# Patient Record
Sex: Female | Born: 1940 | Race: White | Hispanic: No | Marital: Single | Smoking: Never smoker
Health system: Southern US, Community
[De-identification: ages and names within clinical notes are randomized; demographics above are authoritative.]

## PROBLEM LIST (undated history)

## (undated) DIAGNOSIS — F0390 Unspecified dementia without behavioral disturbance: Secondary | ICD-10-CM

## (undated) DIAGNOSIS — F419 Anxiety disorder, unspecified: Secondary | ICD-10-CM

## (undated) DIAGNOSIS — I2510 Atherosclerotic heart disease of native coronary artery without angina pectoris: Secondary | ICD-10-CM

---

## 1999-03-12 ENCOUNTER — Encounter: Payer: Self-pay | Admitting: Ophthalmology

## 1999-03-12 ENCOUNTER — Ambulatory Visit (HOSPITAL_COMMUNITY): Admission: RE | Admit: 1999-03-12 | Discharge: 1999-03-13 | Payer: Self-pay | Admitting: Ophthalmology

## 2003-04-12 ENCOUNTER — Other Ambulatory Visit: Admission: RE | Admit: 2003-04-12 | Discharge: 2003-04-12 | Payer: Self-pay | Admitting: Family Medicine

## 2007-07-01 ENCOUNTER — Encounter: Admission: RE | Admit: 2007-07-01 | Discharge: 2007-07-01 | Payer: Self-pay | Admitting: Family Medicine

## 2010-12-25 ENCOUNTER — Observation Stay (HOSPITAL_COMMUNITY)
Admission: RE | Admit: 2010-12-25 | Discharge: 2010-12-26 | Disposition: A | Discharge status: Home / Self Care | Payer: Medicare Other | Source: Ambulatory Visit | Attending: Ophthalmology | Admitting: Ophthalmology

## 2010-12-25 DIAGNOSIS — F3289 Other specified depressive episodes: Secondary | ICD-10-CM | POA: Insufficient documentation

## 2010-12-25 DIAGNOSIS — G4733 Obstructive sleep apnea (adult) (pediatric): Secondary | ICD-10-CM | POA: Insufficient documentation

## 2010-12-25 DIAGNOSIS — M129 Arthropathy, unspecified: Secondary | ICD-10-CM | POA: Insufficient documentation

## 2010-12-25 DIAGNOSIS — H33339 Multiple defects of retina without detachment, unspecified eye: Secondary | ICD-10-CM | POA: Insufficient documentation

## 2010-12-25 DIAGNOSIS — H35349 Macular cyst, hole, or pseudohole, unspecified eye: Principal | ICD-10-CM | POA: Insufficient documentation

## 2010-12-25 DIAGNOSIS — F329 Major depressive disorder, single episode, unspecified: Secondary | ICD-10-CM | POA: Insufficient documentation

## 2010-12-25 LAB — SURGICAL PCR SCREEN
MRSA, PCR: NEGATIVE
Staphylococcus aureus: NEGATIVE

## 2010-12-25 LAB — AUTOLOGOUS SERUM PATCH PREP

## 2010-12-27 NOTE — Op Note (Signed)
  NAME:  Tiffany Mack, Tiffany Mack NO.:  [id number]  MEDICAL RECORD NO.:  [id number]           PATIENT TYPE:  O  LOCATION:  5149                         FACILITY:  [facility]  PHYSICIAN:  Beulah Gandy. Ashley Royalty, M.D. DATE OF BIRTH:  [birth year]  DATE OF PROCEDURE:  12/25/2010 DATE OF DISCHARGE:                              OPERATIVE REPORT   ADMISSION DIAGNOSES: 1. Macular hole, right eye. 2. Retinal breaks, right eye.  PROCEDURES:  Retinal photocoagulation and pars plana vitrectomy, gas- fluid exchange, membrane peel, serum patch all in the right eye.  SURGEON:  Beulah Gandy. Ashley Royalty, MD  ASSISTANT:  Rosalie Doctor, MA  ANESTHESIA:  General.  DETAILS:  Usual prep and drape, indirect ophthalmoscope laser moved into place, 725 burns placed around suspicious areas for break in the retinal periphery.  The power was 400 mW, 1000 microns each, and 0.1 seconds each.  A 25-gauge trocar was placed at 8, 10, and 2 o'clock, infusion at 8 o'clock.  Contact lens ring anchored into place at 6 and 12 o'clock and a 20-gauge MVR opening was made at 2 o'clock.  Pars plana vitrectomy was begun just behind the pseudophakos.  Dense white membranes were encountered and carefully removed down to the macular surface and out into the mid periphery.  The silicone tip suction line was brought down to the macular surface and the fish strike sign occurred.  The macular hole jumped.  The cutter was repositioned and additional posterior hyaloid was removed.  The 20-gauge diamond dusted membrane scraper was used to engage the internal limiting membrane and remove it from its attachment to this retinal surface for 360 degrees around the hole.  The membrane was removed with the vitreous cutter.  The vitrectomy was carried into the mid periphery into the far periphery.  The vitreous base was trimmed.  A total gas-fluid exchange was performed.  Sufficient time was allowed for additional fluid to track down the walls of  the eye and collected in the posterior segment.  Additional fluid was removed with the [place] ophthalmics brush.  Serum patch was delivered.  Gas-fluid exchange was performed with 16% C3F8 exchanging for intravitreal gas. The instruments were removed from the eye and 9-0 nylon was used to close the sclerotomy site.  The wounds were tested and found to be tight.  The closing tension was 10 with a Risk manager.  Wet-field cautery was used to close the conjunctiva at 2 o'clock.  Polymyxin and gentamicin were irrigated into [place] space.  Atropine solution was applied.  Decadron 10 mg was injected to the lower subconjunctival space.  Polysporin ophthalmic ointment and a patch and shield were placed.  Closing pressure was 10 with a Risk manager. Complications none.  Duration 1 hour and 15 minutes.  The patient was awakened, taken to recovery in satisfactory condition.     Beulah Gandy. Ashley Royalty, M.D.     JDM/MEDQ  D:  12/25/2010  T:  12/26/2010  Job:  [id number]  Electronically Signed by Alan Mulder M.D. on 12/27/2010 07:23:57 AM

## 2011-04-02 ENCOUNTER — Encounter (INDEPENDENT_AMBULATORY_CARE_PROVIDER_SITE_OTHER): Payer: Medicare Other | Admitting: Ophthalmology

## 2011-04-02 DIAGNOSIS — Q143 Congenital malformation of choroid: Secondary | ICD-10-CM

## 2011-04-02 DIAGNOSIS — H35349 Macular cyst, hole, or pseudohole, unspecified eye: Secondary | ICD-10-CM

## 2011-09-30 ENCOUNTER — Encounter (INDEPENDENT_AMBULATORY_CARE_PROVIDER_SITE_OTHER): Payer: Medicare Other | Admitting: Ophthalmology

## 2011-09-30 DIAGNOSIS — H35349 Macular cyst, hole, or pseudohole, unspecified eye: Secondary | ICD-10-CM

## 2011-10-20 ENCOUNTER — Encounter (HOSPITAL_COMMUNITY): Payer: Self-pay

## 2011-10-20 ENCOUNTER — Emergency Department (HOSPITAL_COMMUNITY)
Admission: EM | Admit: 2011-10-20 | Discharge: 2011-10-20 | Disposition: A | Discharge status: Home / Self Care | Payer: No Typology Code available for payment source | Attending: Emergency Medicine | Admitting: Emergency Medicine

## 2011-10-20 ENCOUNTER — Emergency Department (HOSPITAL_COMMUNITY): Payer: No Typology Code available for payment source

## 2011-10-20 DIAGNOSIS — Z7982 Long term (current) use of aspirin: Secondary | ICD-10-CM | POA: Insufficient documentation

## 2011-10-20 DIAGNOSIS — Z79899 Other long term (current) drug therapy: Secondary | ICD-10-CM | POA: Insufficient documentation

## 2011-10-20 DIAGNOSIS — M25569 Pain in unspecified knee: Secondary | ICD-10-CM | POA: Insufficient documentation

## 2011-10-20 DIAGNOSIS — M25519 Pain in unspecified shoulder: Secondary | ICD-10-CM | POA: Insufficient documentation

## 2011-10-20 HISTORY — DX: Anxiety disorder, unspecified: F41.9
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
## 2011-10-20 NOTE — ED Notes (Signed)
MD at bedside. 

## 2011-10-20 NOTE — ED Notes (Signed)
Vital signs stable. 

## 2011-10-20 NOTE — ED Notes (Signed)
Patient transported to X-ray 
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
## 2011-11-12 ENCOUNTER — Other Ambulatory Visit (HOSPITAL_COMMUNITY): Payer: Self-pay | Admitting: Family Medicine

## 2011-11-12 DIAGNOSIS — Z1231 Encounter for screening mammogram for malignant neoplasm of breast: Secondary | ICD-10-CM

## 2011-12-09 ENCOUNTER — Ambulatory Visit (HOSPITAL_COMMUNITY): Payer: Medicare Other

## 2011-12-12 ENCOUNTER — Ambulatory Visit (HOSPITAL_COMMUNITY)
Admission: RE | Admit: 2011-12-12 | Discharge: 2011-12-12 | Disposition: A | Discharge status: Home / Self Care | Payer: Medicare Other | Source: Ambulatory Visit | Attending: Family Medicine | Admitting: Family Medicine

## 2011-12-12 DIAGNOSIS — Z1231 Encounter for screening mammogram for malignant neoplasm of breast: Secondary | ICD-10-CM | POA: Insufficient documentation

## 2012-09-29 ENCOUNTER — Ambulatory Visit (INDEPENDENT_AMBULATORY_CARE_PROVIDER_SITE_OTHER): Payer: Medicare Other | Admitting: Ophthalmology

## 2012-10-12 ENCOUNTER — Ambulatory Visit (INDEPENDENT_AMBULATORY_CARE_PROVIDER_SITE_OTHER): Payer: Self-pay | Admitting: Ophthalmology

## 2012-10-12 DIAGNOSIS — H43819 Vitreous degeneration, unspecified eye: Secondary | ICD-10-CM

## 2012-10-12 DIAGNOSIS — H2700 Aphakia, unspecified eye: Secondary | ICD-10-CM

## 2012-10-12 DIAGNOSIS — H35349 Macular cyst, hole, or pseudohole, unspecified eye: Secondary | ICD-10-CM

## 2013-10-13 ENCOUNTER — Ambulatory Visit (INDEPENDENT_AMBULATORY_CARE_PROVIDER_SITE_OTHER): Payer: Commercial Managed Care - HMO | Admitting: Ophthalmology

## 2013-10-13 DIAGNOSIS — H33309 Unspecified retinal break, unspecified eye: Secondary | ICD-10-CM

## 2013-10-13 DIAGNOSIS — H35349 Macular cyst, hole, or pseudohole, unspecified eye: Secondary | ICD-10-CM

## 2013-11-25 ENCOUNTER — Other Ambulatory Visit: Payer: Self-pay

## 2013-11-25 DIAGNOSIS — Z1231 Encounter for screening mammogram for malignant neoplasm of breast: Secondary | ICD-10-CM

## 2013-12-06 ENCOUNTER — Ambulatory Visit
Admission: RE | Admit: 2013-12-06 | Discharge: 2013-12-06 | Disposition: A | Discharge status: Home / Self Care | Payer: Commercial Managed Care - HMO | Source: Ambulatory Visit

## 2013-12-06 ENCOUNTER — Encounter (INDEPENDENT_AMBULATORY_CARE_PROVIDER_SITE_OTHER): Payer: Self-pay

## 2013-12-06 DIAGNOSIS — Z1231 Encounter for screening mammogram for malignant neoplasm of breast: Secondary | ICD-10-CM

## 2014-04-26 ENCOUNTER — Other Ambulatory Visit: Payer: Self-pay | Admitting: Family Medicine

## 2014-04-26 ENCOUNTER — Ambulatory Visit
Admission: RE | Admit: 2014-04-26 | Discharge: 2014-04-26 | Disposition: A | Discharge status: Home / Self Care | Payer: Commercial Managed Care - HMO | Source: Ambulatory Visit | Attending: Family Medicine | Admitting: Family Medicine

## 2014-04-26 DIAGNOSIS — R198 Other specified symptoms and signs involving the digestive system and abdomen: Secondary | ICD-10-CM

## 2014-10-26 ENCOUNTER — Ambulatory Visit (INDEPENDENT_AMBULATORY_CARE_PROVIDER_SITE_OTHER): Payer: Commercial Managed Care - HMO | Admitting: Ophthalmology

## 2014-11-16 ENCOUNTER — Ambulatory Visit (INDEPENDENT_AMBULATORY_CARE_PROVIDER_SITE_OTHER): Payer: Commercial Managed Care - HMO | Admitting: Ophthalmology

## 2014-11-16 DIAGNOSIS — H33302 Unspecified retinal break, left eye: Secondary | ICD-10-CM | POA: Diagnosis not present

## 2014-11-16 DIAGNOSIS — H35343 Macular cyst, hole, or pseudohole, bilateral: Secondary | ICD-10-CM

## 2014-12-02 ENCOUNTER — Other Ambulatory Visit: Payer: Self-pay

## 2014-12-02 DIAGNOSIS — Z1231 Encounter for screening mammogram for malignant neoplasm of breast: Secondary | ICD-10-CM

## 2014-12-28 ENCOUNTER — Ambulatory Visit
Admission: RE | Admit: 2014-12-28 | Discharge: 2014-12-28 | Disposition: A | Discharge status: Home / Self Care | Payer: Commercial Managed Care - HMO | Source: Ambulatory Visit

## 2014-12-28 DIAGNOSIS — Z1231 Encounter for screening mammogram for malignant neoplasm of breast: Secondary | ICD-10-CM

## 2015-01-25 ENCOUNTER — Emergency Department (HOSPITAL_COMMUNITY): Payer: Commercial Managed Care - HMO

## 2015-01-25 ENCOUNTER — Emergency Department (HOSPITAL_COMMUNITY)
Admission: EM | Admit: 2015-01-25 | Discharge: 2015-01-25 | Disposition: A | Discharge status: Home / Self Care | Payer: Commercial Managed Care - HMO | Attending: Emergency Medicine | Admitting: Emergency Medicine

## 2015-01-25 ENCOUNTER — Encounter (HOSPITAL_COMMUNITY): Payer: Self-pay | Admitting: *Deleted

## 2015-01-25 DIAGNOSIS — Z7982 Long term (current) use of aspirin: Secondary | ICD-10-CM | POA: Insufficient documentation

## 2015-01-25 DIAGNOSIS — H7091 Unspecified mastoiditis, right ear: Secondary | ICD-10-CM

## 2015-01-25 DIAGNOSIS — Z79899 Other long term (current) drug therapy: Secondary | ICD-10-CM | POA: Insufficient documentation

## 2015-01-25 DIAGNOSIS — F419 Anxiety disorder, unspecified: Secondary | ICD-10-CM | POA: Diagnosis not present

## 2015-01-25 DIAGNOSIS — H6691 Otitis media, unspecified, right ear: Secondary | ICD-10-CM

## 2015-01-25 DIAGNOSIS — R519 Headache, unspecified: Secondary | ICD-10-CM

## 2015-01-25 DIAGNOSIS — H7291 Unspecified perforation of tympanic membrane, right ear: Secondary | ICD-10-CM | POA: Diagnosis not present

## 2015-01-25 DIAGNOSIS — R51 Headache: Secondary | ICD-10-CM | POA: Diagnosis not present

## 2015-01-25 DIAGNOSIS — H6591 Unspecified nonsuppurative otitis media, right ear: Secondary | ICD-10-CM | POA: Diagnosis not present

## 2015-01-25 MED ORDER — FLUTICASONE PROPIONATE 50 MCG/ACT NA SUSP: 2.0000 | Freq: Every day | NASAL | Status: DC

## 2015-01-25 MED ORDER — CLARITHROMYCIN 500 MG PO TABS: 500.0000 mg | ORAL_TABLET | Freq: Two times a day (BID) | ORAL | Status: DC

## 2015-01-25 MED ORDER — OXYMETAZOLINE HCL 0.05 % NA SOLN: 1.0000 | Freq: Two times a day (BID) | NASAL | Status: AC

## 2015-01-25 NOTE — Discharge Instructions (Signed)
You have evidence of infection with a perforation of your right ear drum.  Please take medications as prescribed and follow up with your doctor for further care.  Return if your condition worsen or if you have other concerns  Mastoiditis Mastoiditis is an infection that has spread from the middle ear to a bony area (the mastoid air cells) behind the middle ear. It is an uncommon complication of a middle ear infection. It occurs most often in young children. Treatment with the right antibiotics (medications that are used to treat bacteria germs) is generally effective. With the right treatment, there is a very high chance of full recovery.  SYMPTOMS  Some common symptoms of mastoiditis include:   Pain, swelling, redness, warmth, or a tender mass of the bone behind the ear.  Fever.  Fussiness and irritability.  Redness and swelling of the ear lobe or ear.  Ear drainage.  Headache. DIAGNOSIS  Your caregiver will make the diagnosis based on an exam and on questions about what you or your child has been feeling. Other tests that may be done include:  Blood work.  Blood cultures or cultures of ear drainage.  X-rays.  If you or your child has symptoms that suggest more serious problems, additional tests and/or specialized x-rays may be done. These could include:  CT (CAT) scans.  Magnetic Resonance Imaging (MRI). TREATMENT  Treatment will be based on how serious the infection is and what will be expected to give the best outcome. The treatment required may be:  Hospitalization and antibiotics given through a vein.  An operation (myringotomy) is sometimes done to relieve the pressure from the middle ear. This is a surgical procedure where a small hole is cut into the ear drum. A small tube is then placed to keep the hole open and draining. The tubes usually fall out on their own after 6 to 12 months.  In rare cases, if the above treatments do not work, more surgery may be necessary.  This is called a mastoidectomy. RISKS AND COMPLICATIONS These are rare if proper treatment is started early. These can include:  Facial paralysis  Infection and possible destruction of the mastoid bone.  Spread of infection to the neck.  Hearing loss which can be partial or complete on the side of the infection.  Infection spread to the brain.  Clots or blockage of blood vessels in the neck or brain. HOME CARE INSTRUCTIONS   Take prescribed antibiotics and other medications as directed by your caregiver.  Follow-up with an exam by an ENT (Ear, Nose, and Throat) specialist if recommended.  A follow-up hearing test (audiogram) may be recommended. SEEK MEDICAL CARE IF:   You develop recurrent fevers of 100 F (37.8 C) or higher.  You develop new headache, ear, or facial pain that had not happened before.  You feel that there has been loss of hearing. SEEK IMMEDIATE MEDICAL CARE IF:   You develop fevers of 102 F (38.9 C).  You develop severe headache, ear, or facial pain.  You experience sudden hearing loss.  You develop repeated episodes of vomiting.  You develop weakness or drooping of one side of your face.  You experience weakness of one arm, one leg, or on one side of your body.  You develop sudden problems with speech and/or vision. MAKE SURE YOU:   Understand these instructions.  Will watch your condition.  Will get help right away if you are not doing well or get worse. Document Released: 08/14/2006  Document Revised: 10/07/2011 Document Reviewed: 07/03/2007 [facility] Patient Information 2015 [place], [place]. This information is not intended to replace advice given to you by your health care provider. Make sure you discuss any questions you have with your health care provider.  Eardrum Perforation The eardrum is a thin, round tissue inside the ear. It allows you to hear. The eardrum can get torn (perforated). Eardrums often heal on their own. There is often  little or no long-term hearing loss. HOME CARE   Keep your ear dry while it heals. Do not swim, dive, or take showers until your doctor says it is okay.  Before you take a bath, put petroleum jelly all over a cotton ball. Put the cotton ball in your ear. This will keep water out.  Only take medicines as told by your doctor.  Blow your nose gently.  Continue normal activities after your eardrum heals. Your doctor will tell you when your eardrum has healed.  Talk to your doctor before flying on an airplane.  Keep all doctor visits as told. This is important. GET HELP RIGHT AWAY IF:   You have blood or yellowish-white fluid (pus) coming from your ear.  You feel off balance.  You feel dizzy, sick to your stomach (nauseous), or you throw up (vomit).  You have more pain.  You have a fever. MAKE SURE YOU:   Understand these instructions.  Will watch your condition.  Will get help right away if you are not doing well or get worse. Document Released: 01/02/2010 Document Revised: 10/07/2011 Document Reviewed: 01/02/2010 [facility] Patient Information 2015 [place], [place]. This information is not intended to replace advice given to you by your health care provider. Make sure you discuss any questions you have with your health care provider.

## 2015-01-25 NOTE — ED Provider Notes (Signed)
  Face-to-face evaluation   History: She complains of headache for several weeks. After that she began to have right ear pain, pressure-like with drainage several days ago. No fever, cough, or vomiting.  Physical exam: Alert, calm, cooperative. Right EAC has purulent material in it. The TM is not visible. No deformity of the right pinna. No tenderness to percussion over the mastoid sinus.  Radiologic imaging report reviewed and images by CT. No paranasal sinus infection  - viewed, by me.    Evaluation is consistent with acute mastoiditis with secondary otitis media with perforation, all right-sided.  Medical screening examination/treatment/procedure(s) were conducted as a shared visit with non-physician practitioner(s) and myself.  I personally evaluated the patient during the encounter  Mancel BaleElliott Moe Graca, MD 01/26/15 [phone]

## 2015-01-25 NOTE — ED Notes (Signed)
Pt reports having a headache x 2 months, occurs more on left frontal area. Denies n/v. Pt reports coming today due to now also having right ear drainage. No acute distress noted at triage.

## 2015-01-25 NOTE — ED Notes (Signed)
Pt here for evaluation of headache that started two months ago to front of head. No relief

## 2015-01-25 NOTE — ED Provider Notes (Signed)
CSN: [id number]     Arrival date & time 01/25/15  1753 History   First MD Initiated Contact with Patient 01/25/15 1919     Chief Complaint  Patient presents with  . Headache     (Consider location/radiation/quality/duration/timing/severity/associated sxs/prior Treatment) HPI  74 year old female with history of anxiety who presents for evaluation of headache and right ear discomfort. Patient reports for more than 2 months she has had persistent recurrent left-sided headache. She described headaches as a sharp sensation, waxing and waning throughout the day but never fully resolved. Headache seems to improve with taking Goody's powder or with Advil and with rest. Headache is mild to moderate in intensity. She denies having associated fever, chills, vision changes, sinus congestion, diplopia, neck stiffness, focal numbness weakness, confusion, or rash. She has not been seen by her PCP for this headache however she did follow-up with a dentist initially because she thought she has dental infection causing the headache. She was told that her teeth is fine. She also follow-up with her eye specialist several weeks ago for evaluation of a headache but her eye doctor did not think her headache is related to eye pathology. She is here today at the urging of her family member who is concerned about her recurrent headache. She denies any prior history of stroke, and she is not on a blood thinner medication. She denies any specific injury.  Patient also complaining of right ear pain and drainage for the past 2 weeks. She endorses occasional sharp pain to her right ear that is mild. She noticed or any discharge on the pillowcase. She also endorsed tinnitus, and decreased hearing from the same ear. She admits that she uses Q-tip to clean the ear and also use sweet oil with minimal improvement. She has not been seen by a specialist for this.  Past Medical History  Diagnosis Date  . Anxiety    History reviewed. No  pertinent past surgical history. History reviewed. No pertinent family history. History  Substance Use Topics  . Smoking status: Never Smoker   . Smokeless tobacco: Not on file  . Alcohol Use: No   OB History    No data available     Review of Systems  All other systems reviewed and are negative.     Allergies  Review of patient's allergies indicates no known allergies.  Home Medications   Prior to Admission medications   Medication Sig Start Date End Date Taking? Authorizing Provider  Ascorbic Acid (VITAMIN C PO) Take 1 tablet by mouth daily.    Historical Provider, MD  aspirin EC 81 MG tablet Take 162 mg by mouth daily.    Historical Provider, MD  fish oil-omega-3 fatty acids 1000 MG capsule Take 2 g by mouth daily.    Historical Provider, MD  OVER THE COUNTER MEDICATION Take 2 tablets by mouth every morning. Amazin Grape    Historical Provider, MD  Red Yeast Rice 600 MG CAPS Take 2 capsules by mouth daily.    Historical Provider, MD  sertraline (ZOLOFT) 50 MG tablet Take 50 mg by mouth daily.    Historical Provider, MD   BP 148/67 mmHg  Pulse 70  Temp(Src) 97.9 F (36.6 C) (Oral)  Resp 18  Ht 5\' 7"  (1.702 m)  Wt 155 lb 8 oz (70.534 kg)  BMI 24.35 kg/m2  SpO2 95% Physical Exam  Constitutional: She is oriented to person, place, and time. She appears well-developed and well-nourished. No distress.  HENT:  Head: Atraumatic.  Ears: Right ear, TM is dull with effusion present. A 2 mm perforation to the tympanic membrane is noted. Bone marking is diminished but present. Normal ear canal and no pain with manipulations of the lobes. Left ear with normal appearance and intact TM.  Nose: Normal nares Throat: Uvula is midline, no tonsillar enlargement or exudates, and no trismus.  No bruit noted to bilateral temporal region  Eyes: Conjunctivae are normal.  Neck: Neck supple.  No nuchal rigidity  Cardiovascular: Normal rate and regular rhythm.   Pulmonary/Chest: Effort  normal and breath sounds normal.  Abdominal: Soft. There is no tenderness.  Neurological: She is alert and oriented to person, place, and time.  Neurologic exam:  Speech clear, pupils equal round reactive to light, extraocular movements intact  Normal peripheral visual fields Cranial nerves III through XII normal including no facial droop Follows commands, moves all extremities x4, normal strength to bilateral upper and lower extremities at all major muscle groups including grip Sensation normal to light touch  Coordination intact, no limb ataxia, finger-nose-finger normal Rapid alternating movements normal No pronator drift Gait normal   Skin: No rash noted.  Psychiatric: She has a normal mood and affect.  Nursing note and vitals reviewed.   ED Course  Procedures (including critical care time)  Patient here with persistent left-sided headache for several months. Family member is concerned. Given the long duration and patient's age, plan to obtain head CT to rule out malignancy.  Otherwise patient has no focal neuro deficit on exam, mentating appropriately and in no acute distress.  Patient has evidence of right TM perforation with effusion. Mild pain noted. Plan to provide antibiotic and have patient follow-up with ENT for further evaluation. I instructed patient to avoid taking NSAIDs as it can be ototoxic.  9:25 PM Head CT showing fluid within the R mastoid air cells but no intracranial abnormality.  Pt made aware of finding.  Care discussed with Dr. Effie Shy who recommend treating for mastoiditis with Biaxin x 2 weeks, flonase x 1 month, and afrin for 1 week. ENT referral given.    Labs Review Labs Reviewed - No data to display  Imaging Review Ct Head Wo Contrast  01/25/2015   CLINICAL DATA:  Headaches for 2 months, initial encounter  EXAM: CT HEAD WITHOUT CONTRAST  TECHNIQUE: Contiguous axial images were obtained from the base of the skull through the vertex without intravenous  contrast.  COMPARISON:  None.  FINDINGS: Bony calvarium is intact. Paranasal sinuses and left mastoid air cells are within normal limits. Fluid attenuation is noted within the right mastoid air cells which may be related to the patient's current history of year drainage. No other bony abnormality is noted. No findings to suggest acute hemorrhage, acute infarction or space-occupying mass lesion are noted. Very mild atrophic changes are noted.  IMPRESSION: Mild atrophic changes without acute intracranial abnormality.  Fluid within the right mastoid air cells as described.   Electronically Signed   By: Alcide Clever M.D.   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 I personally viewed the imaging tests through PACS system and agrees with radiologist's intepretation I reviewed available ER/hospitalization records through the EMR     Fayrene Helper, PA-C 01/25/15 2126  Mancel Bale, MD 01/26/15 [phone]

## 2015-08-04 ENCOUNTER — Inpatient Hospital Stay (HOSPITAL_COMMUNITY)
Admission: EM | Admit: 2015-08-04 | Discharge: 2015-08-11 | DRG: 246 | Disposition: A | Discharge status: Home / Self Care | Payer: Medicare HMO | Attending: Cardiology | Admitting: Cardiology

## 2015-08-04 ENCOUNTER — Encounter (HOSPITAL_COMMUNITY): Payer: Self-pay | Admitting: Family Medicine

## 2015-08-04 ENCOUNTER — Emergency Department (HOSPITAL_COMMUNITY): Payer: Medicare HMO

## 2015-08-04 ENCOUNTER — Ambulatory Visit (HOSPITAL_COMMUNITY): Admit: 2015-08-04 | Payer: Self-pay | Admitting: Cardiology

## 2015-08-04 ENCOUNTER — Other Ambulatory Visit: Payer: Self-pay

## 2015-08-04 ENCOUNTER — Encounter (HOSPITAL_COMMUNITY)
Admission: EM | Disposition: A | Discharge status: Home / Self Care | Payer: Self-pay | Source: Home / Self Care | Attending: Cardiology

## 2015-08-04 DIAGNOSIS — F329 Major depressive disorder, single episode, unspecified: Secondary | ICD-10-CM | POA: Diagnosis present

## 2015-08-04 DIAGNOSIS — T148XXA Other injury of unspecified body region, initial encounter: Secondary | ICD-10-CM

## 2015-08-04 DIAGNOSIS — R079 Chest pain, unspecified: Secondary | ICD-10-CM | POA: Diagnosis not present

## 2015-08-04 DIAGNOSIS — I10 Essential (primary) hypertension: Secondary | ICD-10-CM | POA: Diagnosis not present

## 2015-08-04 DIAGNOSIS — I729 Aneurysm of unspecified site: Secondary | ICD-10-CM

## 2015-08-04 DIAGNOSIS — K5900 Constipation, unspecified: Secondary | ICD-10-CM | POA: Diagnosis not present

## 2015-08-04 DIAGNOSIS — I214 Non-ST elevation (NSTEMI) myocardial infarction: Principal | ICD-10-CM | POA: Diagnosis present

## 2015-08-04 DIAGNOSIS — F419 Anxiety disorder, unspecified: Secondary | ICD-10-CM | POA: Diagnosis present

## 2015-08-04 DIAGNOSIS — R739 Hyperglycemia, unspecified: Secondary | ICD-10-CM | POA: Diagnosis not present

## 2015-08-04 DIAGNOSIS — D62 Acute posthemorrhagic anemia: Secondary | ICD-10-CM | POA: Diagnosis not present

## 2015-08-04 DIAGNOSIS — Z955 Presence of coronary angioplasty implant and graft: Secondary | ICD-10-CM

## 2015-08-04 DIAGNOSIS — D649 Anemia, unspecified: Secondary | ICD-10-CM | POA: Diagnosis not present

## 2015-08-04 DIAGNOSIS — R7309 Other abnormal glucose: Secondary | ICD-10-CM | POA: Diagnosis not present

## 2015-08-04 DIAGNOSIS — Z23 Encounter for immunization: Secondary | ICD-10-CM

## 2015-08-04 DIAGNOSIS — I2510 Atherosclerotic heart disease of native coronary artery without angina pectoris: Secondary | ICD-10-CM | POA: Diagnosis not present

## 2015-08-04 DIAGNOSIS — R69 Illness, unspecified: Secondary | ICD-10-CM | POA: Diagnosis not present

## 2015-08-04 DIAGNOSIS — T148 Other injury of unspecified body region: Secondary | ICD-10-CM | POA: Diagnosis not present

## 2015-08-04 DIAGNOSIS — Z8249 Family history of ischemic heart disease and other diseases of the circulatory system: Secondary | ICD-10-CM | POA: Diagnosis not present

## 2015-08-04 DIAGNOSIS — I728 Aneurysm of other specified arteries: Secondary | ICD-10-CM | POA: Diagnosis not present

## 2015-08-04 HISTORY — PX: CARDIAC CATHETERIZATION: SHX172

## 2015-08-04 LAB — COMPREHENSIVE METABOLIC PANEL
ALT: 32 U/L (ref 14–54)
AST: 149 U/L — ABNORMAL HIGH (ref 15–41)
Albumin: 2.9 g/dL — ABNORMAL LOW (ref 3.5–5.0)
Alkaline Phosphatase: 76 U/L (ref 38–126)
Anion gap: 9 (ref 5–15)
BUN: <5 mg/dL — ABNORMAL LOW (ref 6–20)
CO2: 24 mmol/L (ref 22–32)
Calcium: 8.6 mg/dL — ABNORMAL LOW (ref 8.9–10.3)
Chloride: 107 mmol/L (ref 101–111)
Creatinine, Ser: 0.48 mg/dL (ref 0.44–1.00)
GFR calc Af Amer: >60 mL/min (ref >60)
GFR calc non Af Amer: >60 mL/min (ref >60)
Glucose, Bld: 123 mg/dL — ABNORMAL HIGH (ref 65–99)
Potassium: 3.5 mmol/L (ref 3.5–5.1)
Sodium: 140 mmol/L (ref 135–145)
Total Bilirubin: 0.4 mg/dL (ref 0.3–1.2)
Total Protein: 5.4 g/dL — ABNORMAL LOW (ref 6.5–8.1)

## 2015-08-04 LAB — I-STAT TROPONIN, ED: Troponin i, poc: 2.94 ng/mL (ref 0.00–0.08)

## 2015-08-04 LAB — BASIC METABOLIC PANEL
Anion gap: 9 (ref 5–15)
BUN: 6 mg/dL (ref 6–20)
CO2: 27 mmol/L (ref 22–32)
Calcium: 9.5 mg/dL (ref 8.9–10.3)
Chloride: 102 mmol/L (ref 101–111)
Creatinine, Ser: 0.65 mg/dL (ref 0.44–1.00)
GFR calc Af Amer: >60 mL/min (ref >60)
GFR calc non Af Amer: >60 mL/min (ref >60)
Glucose, Bld: 108 mg/dL — ABNORMAL HIGH (ref 65–99)
Potassium: 3.1 mmol/L — ABNORMAL LOW (ref 3.5–5.1)
Sodium: 138 mmol/L (ref 135–145)

## 2015-08-04 LAB — CBC WITH DIFFERENTIAL/PLATELET
Basophils Absolute: 0 10*3/uL (ref 0.0–0.1)
Basophils Relative: 0 %
Eosinophils Absolute: 0.1 10*3/uL (ref 0.0–0.7)
Eosinophils Relative: 1 %
HCT: 31.1 % — ABNORMAL LOW (ref 36.0–46.0)
Hemoglobin: 10 g/dL — ABNORMAL LOW (ref 12.0–15.0)
Lymphocytes Relative: 23 %
Lymphs Abs: 1.6 10*3/uL (ref 0.7–4.0)
MCH: 28.3 pg (ref 26.0–34.0)
MCHC: 32.2 g/dL (ref 30.0–36.0)
MCV: 88.1 fL (ref 78.0–100.0)
Monocytes Absolute: 0.3 10*3/uL (ref 0.1–1.0)
Monocytes Relative: 4 %
Neutro Abs: 5 10*3/uL (ref 1.7–7.7)
Neutrophils Relative %: 72 %
Platelets: 230 10*3/uL (ref 150–400)
RBC: 3.53 MIL/uL — ABNORMAL LOW (ref 3.87–5.11)
RDW: 12.5 % (ref 11.5–15.5)
WBC: 6.9 10*3/uL (ref 4.0–10.5)

## 2015-08-04 LAB — CBC
HCT: 35.7 % — ABNORMAL LOW (ref 36.0–46.0)
Hemoglobin: 11.5 g/dL — ABNORMAL LOW (ref 12.0–15.0)
MCH: 28.7 pg (ref 26.0–34.0)
MCHC: 32.2 g/dL (ref 30.0–36.0)
MCV: 89 fL (ref 78.0–100.0)
Platelets: 265 10*3/uL (ref 150–400)
RBC: 4.01 MIL/uL (ref 3.87–5.11)
RDW: 12.4 % (ref 11.5–15.5)
WBC: 8.6 10*3/uL (ref 4.0–10.5)

## 2015-08-04 LAB — MAGNESIUM: Magnesium: 1.7 mg/dL (ref 1.7–2.4)

## 2015-08-04 LAB — LIPASE, BLOOD: Lipase: 35 U/L (ref 11–51)

## 2015-08-04 LAB — POCT ACTIVATED CLOTTING TIME: Activated Clotting Time: 173 seconds

## 2015-08-04 LAB — TROPONIN I
Troponin I: 3.61 ng/mL (ref <0.031)
Troponin I: >65 ng/mL (ref <0.031)

## 2015-08-04 SURGERY — LEFT HEART CATH AND CORONARY ANGIOGRAPHY
Anesthesia: LOCAL

## 2015-08-04 MED ORDER — SODIUM CHLORIDE 0.9 % IJ SOLN: 3.0000 mL | INTRAMUSCULAR | Status: DC | PRN

## 2015-08-04 MED ORDER — POTASSIUM CHLORIDE CRYS ER 20 MEQ PO TBCR
40.0000 meq | EXTENDED_RELEASE_TABLET | Freq: Once | ORAL | Status: AC
  Administered 2015-08-04: 40 meq via ORAL

## 2015-08-04 MED ORDER — TICAGRELOR 90 MG PO TABS
90.0000 mg | ORAL_TABLET | Freq: Two times a day (BID) | ORAL | Status: DC
  Administered 2015-08-05 – 2015-08-11 (×13): 90 mg via ORAL
  Filled 2015-08-04 (×13): qty 1

## 2015-08-04 MED ORDER — ATROPINE SULFATE 0.1 MG/ML IJ SOLN
INTRAMUSCULAR | Status: AC
  Filled 2015-08-04: qty 10

## 2015-08-04 MED ORDER — FAMOTIDINE IN NACL 20-0.9 MG/50ML-% IV SOLN
INTRAVENOUS | Status: DC | PRN
  Administered 2015-08-04: 20 mg via INTRAVENOUS

## 2015-08-04 MED ORDER — FENTANYL CITRATE (PF) 100 MCG/2ML IJ SOLN
INTRAMUSCULAR | Status: AC
  Filled 2015-08-04: qty 2

## 2015-08-04 MED ORDER — TICAGRELOR 90 MG PO TABS
ORAL_TABLET | ORAL | Status: AC
  Filled 2015-08-04: qty 2

## 2015-08-04 MED ORDER — TIROFIBAN (AGGRASTAT) BOLUS VIA INFUSION
INTRAVENOUS | Status: DC | PRN
  Administered 2015-08-04: 1712.5 ug via INTRAVENOUS

## 2015-08-04 MED ORDER — IOHEXOL 350 MG/ML SOLN
INTRAVENOUS | Status: DC | PRN
  Administered 2015-08-04: 175 mL via INTRA_ARTERIAL

## 2015-08-04 MED ORDER — ASPIRIN 81 MG PO CHEW
324.0000 mg | CHEWABLE_TABLET | Freq: Once | ORAL | Status: AC
  Administered 2015-08-04: 324 mg via ORAL
  Filled 2015-08-04: qty 4

## 2015-08-04 MED ORDER — SODIUM CHLORIDE 0.9 % IV SOLN
INTRAVENOUS | Status: AC
  Administered 2015-08-04: 23:00:00 via INTRAVENOUS

## 2015-08-04 MED ORDER — ASPIRIN EC 81 MG PO TBEC
81.0000 mg | DELAYED_RELEASE_TABLET | Freq: Every day | ORAL | Status: DC
  Administered 2015-08-05 – 2015-08-11 (×7): 81 mg via ORAL
  Filled 2015-08-04 (×7): qty 1

## 2015-08-04 MED ORDER — ACETAMINOPHEN 325 MG PO TABS
650.0000 mg | ORAL_TABLET | ORAL | Status: DC | PRN
  Administered 2015-08-04 – 2015-08-11 (×7): 650 mg via ORAL
  Filled 2015-08-04 (×7): qty 2

## 2015-08-04 MED ORDER — FAMOTIDINE IN NACL 20-0.9 MG/50ML-% IV SOLN
INTRAVENOUS | Status: AC
  Filled 2015-08-04: qty 50

## 2015-08-04 MED ORDER — HEPARIN (PORCINE) IN NACL 2-0.9 UNIT/ML-% IJ SOLN
INTRAMUSCULAR | Status: AC
  Filled 2015-08-04: qty 1000

## 2015-08-04 MED ORDER — MIDAZOLAM HCL 2 MG/2ML IJ SOLN
INTRAMUSCULAR | Status: DC | PRN
  Administered 2015-08-04: 1 mg via INTRAVENOUS

## 2015-08-04 MED ORDER — HEPARIN SODIUM (PORCINE) 5000 UNIT/ML IJ SOLN
4000.0000 [IU] | Freq: Once | INTRAMUSCULAR | Status: AC
  Administered 2015-08-04: 4000 [IU] via INTRAVENOUS
  Filled 2015-08-04: qty 1

## 2015-08-04 MED ORDER — HEPARIN (PORCINE) IN NACL 100-0.45 UNIT/ML-% IJ SOLN
800.0000 [IU]/h | INTRAMUSCULAR | Status: DC
  Administered 2015-08-04: 800 [IU]/h via INTRAVENOUS
  Filled 2015-08-04: qty 250

## 2015-08-04 MED ORDER — NITROGLYCERIN IN D5W 200-5 MCG/ML-% IV SOLN
0.0000 ug/min | INTRAVENOUS | Status: DC
  Administered 2015-08-04: 1.5 ug/min via INTRAVENOUS
  Filled 2015-08-04: qty 250

## 2015-08-04 MED ORDER — ASPIRIN 81 MG PO CHEW: 81.0000 mg | CHEWABLE_TABLET | Freq: Every day | ORAL | Status: DC

## 2015-08-04 MED ORDER — FENTANYL CITRATE (PF) 100 MCG/2ML IJ SOLN
INTRAMUSCULAR | Status: DC | PRN
  Administered 2015-08-04 (×2): 25 ug via INTRAVENOUS

## 2015-08-04 MED ORDER — SODIUM CHLORIDE 0.9 % IV SOLN
250.0000 mg | INTRAVENOUS | Status: DC | PRN
  Administered 2015-08-04: 1.75 mg/kg/h via INTRAVENOUS

## 2015-08-04 MED ORDER — METOPROLOL TARTRATE 12.5 MG HALF TABLET
12.5000 mg | ORAL_TABLET | Freq: Two times a day (BID) | ORAL | Status: DC
  Administered 2015-08-04 – 2015-08-11 (×12): 12.5 mg via ORAL
  Filled 2015-08-04 (×13): qty 1

## 2015-08-04 MED ORDER — SODIUM CHLORIDE 0.9 % IJ SOLN
3.0000 mL | Freq: Two times a day (BID) | INTRAMUSCULAR | Status: DC
  Administered 2015-08-05 – 2015-08-08 (×7): 3 mL via INTRAVENOUS

## 2015-08-04 MED ORDER — ATORVASTATIN CALCIUM 80 MG PO TABS
80.0000 mg | ORAL_TABLET | Freq: Every day | ORAL | Status: DC
  Administered 2015-08-04 – 2015-08-10 (×7): 80 mg via ORAL
  Filled 2015-08-04 (×7): qty 1

## 2015-08-04 MED ORDER — ASPIRIN 300 MG RE SUPP: 300.0000 mg | RECTAL | Status: DC

## 2015-08-04 MED ORDER — HEPARIN BOLUS VIA INFUSION
4000.0000 [IU] | Freq: Once | INTRAVENOUS | Status: AC
  Administered 2015-08-04: 4000 [IU] via INTRAVENOUS
  Filled 2015-08-04: qty 4000

## 2015-08-04 MED ORDER — POTASSIUM CHLORIDE CRYS ER 20 MEQ PO TBCR
EXTENDED_RELEASE_TABLET | ORAL | Status: AC
  Filled 2015-08-04: qty 2

## 2015-08-04 MED ORDER — TICAGRELOR 90 MG PO TABS
ORAL_TABLET | ORAL | Status: DC | PRN
  Administered 2015-08-04: 180 mg via ORAL

## 2015-08-04 MED ORDER — NITROGLYCERIN 0.4 MG SL SUBL
0.4000 mg | SUBLINGUAL_TABLET | SUBLINGUAL | Status: DC | PRN
  Filled 2015-08-04: qty 1

## 2015-08-04 MED ORDER — BIVALIRUDIN 250 MG IV SOLR
INTRAVENOUS | Status: AC
  Filled 2015-08-04: qty 250

## 2015-08-04 MED ORDER — TIROFIBAN HCL IN NACL 5-0.9 MG/100ML-% IV SOLN
INTRAVENOUS | Status: DC | PRN
  Administered 2015-08-04: 0.15 ug/kg/min via INTRAVENOUS

## 2015-08-04 MED ORDER — BIVALIRUDIN BOLUS VIA INFUSION - CUPID
INTRAVENOUS | Status: DC | PRN
  Administered 2015-08-04: 51.375 mg via INTRAVENOUS

## 2015-08-04 MED ORDER — LIDOCAINE HCL (PF) 1 % IJ SOLN
INTRAMUSCULAR | Status: AC
  Filled 2015-08-04: qty 30

## 2015-08-04 MED ORDER — MIDAZOLAM HCL 2 MG/2ML IJ SOLN
INTRAMUSCULAR | Status: AC
  Filled 2015-08-04: qty 2

## 2015-08-04 MED ORDER — ONDANSETRON 4 MG PO TBDP
4.0000 mg | ORAL_TABLET | Freq: Once | ORAL | Status: AC | PRN
  Administered 2015-08-04: 4 mg via ORAL

## 2015-08-04 MED ORDER — HEPARIN (PORCINE) IN NACL 100-0.45 UNIT/ML-% IJ SOLN: 12.0000 [IU]/kg/h | Freq: Once | INTRAMUSCULAR | Status: DC

## 2015-08-04 MED ORDER — SODIUM CHLORIDE 0.9 % IV SOLN
250.0000 mL | INTRAVENOUS | Status: DC | PRN
Start: 2015-08-04 — End: 2015-08-09

## 2015-08-04 MED ORDER — SODIUM CHLORIDE 0.9 % IV SOLN
INTRAVENOUS | Status: DC | PRN
  Administered 2015-08-04: 69 mL/h via INTRAVENOUS
  Administered 2015-08-04: 250 mL via INTRAVENOUS

## 2015-08-04 MED ORDER — PNEUMOCOCCAL VAC POLYVALENT 25 MCG/0.5ML IJ INJ
0.5000 mL | INJECTION | INTRAMUSCULAR | Status: AC
  Administered 2015-08-05: 0.5 mL via INTRAMUSCULAR
  Filled 2015-08-04: qty 0.5

## 2015-08-04 MED ORDER — POTASSIUM CHLORIDE CRYS ER 20 MEQ PO TBCR
40.0000 meq | EXTENDED_RELEASE_TABLET | Freq: Once | ORAL | Status: AC
  Administered 2015-08-04: 40 meq via ORAL
  Filled 2015-08-04: qty 2

## 2015-08-04 MED ORDER — ASPIRIN 81 MG PO CHEW: 324.0000 mg | CHEWABLE_TABLET | ORAL | Status: DC

## 2015-08-04 MED ORDER — NITROGLYCERIN IN D5W 200-5 MCG/ML-% IV SOLN
5.0000 ug/min | INTRAVENOUS | Status: DC
Start: 2015-08-04 — End: 2015-08-11

## 2015-08-04 MED ORDER — PANTOPRAZOLE SODIUM 40 MG PO TBEC
40.0000 mg | DELAYED_RELEASE_TABLET | Freq: Every day | ORAL | Status: DC
Start: 2015-08-05 — End: 2015-08-11
  Administered 2015-08-05 – 2015-08-11 (×7): 40 mg via ORAL
  Filled 2015-08-04 (×7): qty 1

## 2015-08-04 MED ORDER — ONDANSETRON 4 MG PO TBDP
ORAL_TABLET | ORAL | Status: AC
  Filled 2015-08-04: qty 1

## 2015-08-04 MED ORDER — ATROPINE SULFATE 0.1 MG/ML IJ SOLN
INTRAMUSCULAR | Status: DC | PRN
Start: 2015-08-04 — End: 2015-08-04
  Administered 2015-08-04: 0.5 mg via INTRAVENOUS

## 2015-08-04 MED ORDER — TIROFIBAN HCL IN NACL 5-0.9 MG/100ML-% IV SOLN
INTRAVENOUS | Status: AC
  Filled 2015-08-04: qty 100

## 2015-08-04 MED ORDER — NITROGLYCERIN 1 MG/10 ML FOR IR/CATH LAB
INTRA_ARTERIAL | Status: DC | PRN
  Administered 2015-08-04: 22:00:00

## 2015-08-04 MED ORDER — CLOPIDOGREL BISULFATE 300 MG PO TABS
300.0000 mg | ORAL_TABLET | Freq: Once | ORAL | Status: AC
  Administered 2015-08-04: 300 mg via ORAL
  Filled 2015-08-04: qty 1

## 2015-08-04 SURGICAL SUPPLY — 25 items
BALLN MINITREK RX 2.0X12 (BALLOONS) ×2
BALLN TREK RX 2.5X15 (BALLOONS) ×2
BALLN ~~LOC~~ EMERGE MR 2.75X20 (BALLOONS) ×2
BALLN ~~LOC~~ EMERGE MR 3.5X15 (BALLOONS) ×2
BALLN ~~LOC~~ EMERGE MR 4.0X8 (BALLOONS) ×2
BALLOON MINITREK RX 2.0X12 (BALLOONS) ×1 IMPLANT
BALLOON TREK RX 2.5X15 (BALLOONS) ×1 IMPLANT
BALLOON ~~LOC~~ EMERGE MR 2.75X20 (BALLOONS) ×1 IMPLANT
BALLOON ~~LOC~~ EMERGE MR 3.5X15 (BALLOONS) ×1 IMPLANT
BALLOON ~~LOC~~ EMERGE MR 4.0X8 (BALLOONS) ×1 IMPLANT
CATH INFINITI 5FR MULTPACK ANG (CATHETERS) ×2 IMPLANT
CATH VISTA GUIDE 6FR JR4 (CATHETERS) ×2 IMPLANT
CATH VISTA GUIDE 6FR JR4 SH (CATHETERS) ×2 IMPLANT
ELECT DEFIB PAD ADLT CADENCE (PAD) ×2 IMPLANT
KIT ENCORE 26 ADVANTAGE (KITS) ×2 IMPLANT
KIT HEART LEFT (KITS) ×2 IMPLANT
PACK CARDIAC CATHETERIZATION (CUSTOM PROCEDURE TRAY) ×2 IMPLANT
SHEATH PINNACLE 6F 10CM (SHEATH) ×2 IMPLANT
STENT XIENCE ALPINE RX 2.75X38 (Permanent Stent) ×2 IMPLANT
STENT XIENCE ALPINE RX 3.0X33 (Permanent Stent) ×2 IMPLANT
STENT XIENCE ALPINE RX 4.0X12 (Permanent Stent) ×2 IMPLANT
SYR MEDRAD MARK V 150ML (SYRINGE) ×2 IMPLANT
TRANSDUCER W/STOPCOCK (MISCELLANEOUS) ×2 IMPLANT
WIRE EMERALD 3MM-J .035X150CM (WIRE) ×2 IMPLANT
WIRE PT2 MS 185 (WIRE) ×4 IMPLANT

## 2015-08-04 NOTE — ED Provider Notes (Signed)
CSN: [id number]     Arrival date & time 08/04/15  1508 History   First MD Initiated Contact with Patient 08/04/15 1601     Chief Complaint  Patient presents with  . Chest Pain   75 year old Caucasian female with PMH of anxiety who presents today with chest pain. Patient endorses this has been going on intermittently for the past week, onset while at rest. Accompanied by nausea and diaphoresis but no shortness of breath. Initially thought this was acid reflux and tried Pepcid with only mild relief. At its worst pain was a 7 out of 10 currently after aspirin pain is at about a 5 out of 10. Not worse with movement. She denies fevers, chills, vomiting, abdominal pain, flank pain, dysuria, hematuria, cough, sick contacts, or recent travel.  (Consider location/radiation/quality/duration/timing/severity/associated sxs/prior Treatment) Patient is a 75 y.o. female presenting with chest pain. The history is provided by the patient.  Chest Pain Pain location:  Substernal area Pain quality: tightness   Pain radiates to:  Does not radiate Pain radiates to the back: no   Pain severity:  Moderate Onset quality:  Sudden Timing:  Intermittent Progression:  Partially resolved Chronicity:  New Context: at rest   Relieved by:  Nothing Ineffective treatments:  None tried Associated symptoms: abdominal pain (epigastric), diaphoresis and nausea   Associated symptoms: no dizziness, no fever, no headache, no palpitations, no shortness of breath and not vomiting   Risk factors: no coronary artery disease, no diabetes mellitus, no hypertension and no smoking     Past Medical History  Diagnosis Date  . Anxiety    History reviewed. No pertinent past surgical history. History reviewed. No pertinent family history. Social History  Substance Use Topics  . Smoking status: Never Smoker   . Smokeless tobacco: None  . Alcohol Use: No   OB History    No data available     Review of Systems  Constitutional:  Positive for diaphoresis. Negative for fever and chills.  Respiratory: Negative for shortness of breath.   Cardiovascular: Positive for chest pain (substernal). Negative for palpitations and leg swelling.  Gastrointestinal: Positive for nausea and abdominal pain (epigastric). Negative for vomiting, diarrhea, constipation and abdominal distention.  Genitourinary: Negative for dysuria, frequency, flank pain and decreased urine volume.  Skin: Negative for wound.  Neurological: Negative for dizziness, speech difficulty, light-headedness and headaches.  All other systems reviewed and are negative.     Allergies  Review of patient's allergies indicates no known allergies.  Home Medications   Prior to Admission medications   Medication Sig Start Date End Date Taking? Authorizing Provider  aspirin-acetaminophen-caffeine (EXCEDRIN MIGRAINE) [phone] MG tablet Take 1 tablet by mouth daily as needed for headache.   Yes Historical Provider, MD  Multiple Vitamin (MULTIVITAMIN WITH MINERALS) TABS tablet Take 1 tablet by mouth 3 (three) times daily.   Yes Historical Provider, MD  OVER THE COUNTER MEDICATION Place 1 drop into both eyes daily as needed (dry eyes). Over the counter lubricating eye wash   Yes Historical Provider, MD   BP 115/56 mmHg  Pulse 72  Temp(Src) 97.7 F (36.5 C) (Oral)  Resp 16  Ht 5' 6.93" (1.7 m)  Wt 68.493 kg  BMI 23.70 kg/m2  SpO2 100% Physical Exam  Constitutional: She is oriented to person, place, and time. She appears well-developed and well-nourished. No distress.  HENT:  Head: Normocephalic and atraumatic.  Cardiovascular: Normal rate, regular rhythm, normal heart sounds and intact distal pulses.  Exam reveals no gallop  and no friction rub.   No murmur heard. Pulmonary/Chest: Effort normal and breath sounds normal. No respiratory distress. She has no wheezes. She has no rales. She exhibits no tenderness.  Abdominal: Soft. Bowel sounds are normal. She exhibits  no distension and no mass. There is no tenderness. There is no rebound and no guarding.  Musculoskeletal: Normal range of motion. She exhibits no edema or tenderness.  Lymphadenopathy:    She has no cervical adenopathy.  Neurological: She is alert and oriented to person, place, and time. No cranial nerve deficit. Coordination normal.  Skin: Skin is warm and dry. No rash noted. She is not diaphoretic.  Nursing note and vitals reviewed.   ED Course  Procedures (including critical care time) Labs Review Labs Reviewed  BASIC METABOLIC PANEL - Abnormal; Notable for the following:    Potassium 3.1 (*)    Glucose, Bld 108 (*)    All other components within normal limits  CBC - Abnormal; Notable for the following:    Hemoglobin 11.5 (*)    HCT 35.7 (*)    All other components within normal limits  TROPONIN I - Abnormal; Notable for the following:    Troponin I 3.61 (*)    All other components within normal limits  I-STAT TROPOININ, ED - Abnormal; Notable for the following:    Troponin i, poc 2.94 (*)    All other components within normal limits  LIPASE, BLOOD  HEPARIN LEVEL (UNFRACTIONATED)  CBC    Imaging Review Dg Chest Portable 1 View  08/04/2015  CLINICAL DATA:  Intermittent episodes of midsternal chest pain over the past week relieved with burping. EXAM: PORTABLE CHEST 1 VIEW COMPARISON:  Chest x-ray dated 10/20/2011. FINDINGS: Heart size is normal. Overall cardiomediastinal silhouette is stable in size and configuration. There is stable mild pleural thickening at each lung apex. Mild scarring/ fibrosis again noted at the lung bases. No new lung findings. No evidence of pneumonia. No pleural effusion. No pneumothorax. Osseous structures about the chest are unremarkable. IMPRESSION: Stable chest x-ray. No evidence of acute cardiopulmonary abnormality. Electronically Signed   By: Bary RichardStan  Maynard M.D.   On: 08/04/2015 16:31   I have personally reviewed and evaluated these images and lab  results as part of my medical decision-making.   EKG Interpretation   Date/Time:  Friday August 04 2015 15:13:43 EST Ventricular Rate:  68 PR Interval:  164 QRS Duration: 76 QT Interval:  366 QTC Calculation: 389 R Axis:   81 Text Interpretation:  Normal sinus rhythm T wave abnormality, consider  inferior ischemia Abnormal ECG No old tracing to compare Confirmed by  FLOYD MD, DANIEL [phone]([id number]) on 08/04/2015 3:59:31 PM      MDM   Final diagnoses:  NSTEMI (non-ST elevated myocardial infarction) [facility](HCC)   75 year old Caucasian female here with chest pain. See history of present illness for details. On exam patient in NAD, afebrile, vital signs stable. Chest pain is not reproducible.   Trop 2.9-->3.6, EKG w/TWI in inferior leads and peaked Twaves in V2. Concerning for ACS. Rec'd 324mg  ASA. Ordered SL nitro.   Discussed w/cardiology: giving plavix, heparin, and nitro gtt. Admit to cardiology.   Pt was seen under the supervision of Dr. Adela LankFloyd.     Rachelle HoraKeri Anwar Sakata, MD 08/04/15 Ninfa Linden1938  Melene Planan Floyd, DO 08/05/15 [phone]

## 2015-08-04 NOTE — ED Notes (Signed)
No chest pain  Pt is very comfortable

## 2015-08-04 NOTE — H&P (Signed)
Tiffany Mack is an 75 y.o. female.   Chief Complaint: Recurrent chest pain for last 3 days HPI: Patient is 75 year old female with past medical history significant for depression/anxiety disorder, migraine headache, positive family history of coronary artery disease came to the ER complaining of recurrent retrosternal chest pain described as tightness grade 7/10 initially to Pepto-Bismol and drank Coke with relief of chest pain today the pain got worst associated with nausea vomiting and mild shortness of breath so decided to come to the ED EKG done in the ED showed normal sinus rhythm with the minor ST-T wave changes in inferior leads and was noted to have elevated troponin I of 3.61. Patient received aspirin IV heparin and nitroglycerin with partial relief of chest pain from 7/10-2/10.  Past Medical History  Diagnosis Date  . Anxiety     History reviewed. No pertinent past surgical history.  History reviewed. No pertinent family history. Social History:  reports that she has never smoked. She does not have any smokeless tobacco history on file. She reports that she does not drink alcohol or use illicit drugs.  Allergies: No Known Allergies  Medications Prior to Admission  Medication Sig Dispense Refill  . aspirin-acetaminophen-caffeine (EXCEDRIN MIGRAINE) 250-250-65 MG tablet Take 1 tablet by mouth daily as needed for headache.    . Multiple Vitamin (MULTIVITAMIN WITH MINERALS) TABS tablet Take 1 tablet by mouth 3 (three) times daily.    Marland Kitchen OVER THE COUNTER MEDICATION Place 1 drop into both eyes daily as needed (dry eyes). Over the counter lubricating eye wash      Results for orders placed or performed during the hospital encounter of 08/04/15 (from the past 48 hour(s))  Basic metabolic panel     Status: Abnormal   Collection Time: 08/04/15  3:14 PM  Result Value Ref Range   Sodium 138 135 - 145 mmol/L   Potassium 3.1 (L) 3.5 - 5.1 mmol/L   Chloride 102 101 - 111 mmol/L   CO2  27 22 - 32 mmol/L   Glucose, Bld 108 (H) 65 - 99 mg/dL   BUN 6 6 - 20 mg/dL   Creatinine, Ser 0.65 0.44 - 1.00 mg/dL   Calcium 9.5 8.9 - 10.3 mg/dL   GFR calc non Af Amer >60 >60 mL/min   GFR calc Af Amer >60 >60 mL/min    Comment: (NOTE) The eGFR has been calculated using the CKD EPI equation. This calculation has not been validated in all clinical situations. eGFR's persistently <60 mL/min signify possible Chronic Kidney Disease.    Anion gap 9 5 - 15  CBC     Status: Abnormal   Collection Time: 08/04/15  3:14 PM  Result Value Ref Range   WBC 8.6 4.0 - 10.5 K/uL   RBC 4.01 3.87 - 5.11 MIL/uL   Hemoglobin 11.5 (L) 12.0 - 15.0 g/dL   HCT 35.7 (L) 36.0 - 46.0 %   MCV 89.0 78.0 - 100.0 fL   MCH 28.7 26.0 - 34.0 pg   MCHC 32.2 30.0 - 36.0 g/dL   RDW 12.4 11.5 - 15.5 %   Platelets 265 150 - 400 K/uL  Lipase, blood     Status: None   Collection Time: 08/04/15  3:14 PM  Result Value Ref Range   Lipase 35 11 - 51 U/L  I-stat troponin, ED (not at [facility])     Status: Abnormal   Collection Time: 08/04/15  3:40 PM  Result Value Ref Range   Troponin i,  poc 2.94 (HH) 0.00 - 0.08 ng/mL   Comment NOTIFIED PHYSICIAN    Comment 3            Comment: Due to the release kinetics of cTnI, a negative result within the first hours of the onset of symptoms does not rule out myocardial infarction with certainty. If myocardial infarction is still suspected, repeat the test at appropriate intervals.   Troponin I     Status: Abnormal   Collection Time: 08/04/15  4:23 PM  Result Value Ref Range   Troponin I 3.61 (HH) <0.031 ng/mL    Comment:        POSSIBLE MYOCARDIAL ISCHEMIA. SERIAL TESTING RECOMMENDED. CRITICAL RESULT CALLED TO, READ BACK BY AND VERIFIED WITH: C CHRISCO,RN 1701 08/04/15 D BRADLEY    Dg Chest Portable 1 View  08/04/2015  CLINICAL DATA:  Intermittent episodes of midsternal chest pain over the past week relieved with burping. EXAM: PORTABLE CHEST 1 VIEW COMPARISON:  Chest  x-ray dated 10/20/2011. FINDINGS: Heart size is normal. Overall cardiomediastinal silhouette is stable in size and configuration. There is stable mild pleural thickening at each lung apex. Mild scarring/ fibrosis again noted at the lung bases. No new lung findings. No evidence of pneumonia. No pleural effusion. No pneumothorax. Osseous structures about the chest are unremarkable. IMPRESSION: Stable chest x-ray. No evidence of acute cardiopulmonary abnormality. Electronically Signed   By: Franki Cabot M.D.   On: 08/04/2015 16:31    Review of Systems  Constitutional: Negative for fever and chills.  HENT: Negative for hearing loss.   Eyes: Negative for double vision and photophobia.  Respiratory: Positive for shortness of breath.   Cardiovascular: Positive for chest pain. Negative for palpitations, orthopnea, claudication and leg swelling.  Gastrointestinal: Positive for nausea and vomiting.  Genitourinary: Negative for dysuria.  Neurological: Negative for dizziness and headaches.    Blood pressure 115/56, pulse 72, temperature 97.7 F (36.5 C), temperature source Oral, resp. rate 16, height 5' 6.93" (1.7 m), weight 151 lb (68.493 kg), SpO2 100 %. Physical Exam  Constitutional: She is oriented to person, place, and time.  HENT:  Head: Normocephalic and atraumatic.  Eyes: Conjunctivae are normal. Pupils are equal, round, and reactive to light. Left eye exhibits no discharge.  Neck: Normal range of motion. Neck supple. No JVD present. No tracheal deviation present. No thyromegaly present.  Cardiovascular: Normal rate and regular rhythm.   Murmur (Soft systolic murmur and S4 gallop noted) heard. Respiratory: Effort normal and breath sounds normal. No respiratory distress. She has no wheezes. She has no rales.  GI: Soft. Bowel sounds are normal. She exhibits no distension. There is no tenderness. There is no rebound.  Musculoskeletal: She exhibits no edema or tenderness.  Neurological: She is  alert and oriented to person, place, and time.     Assessment/Plan Acute non-Q-wave myocardial infarction History of migraine headache Impression Anxiety disorder Family history of coronary artery disease Plan Discussed with patient and her son regarding emergency left Possible PTCA stenting its risk and benefits i.e. death MI stroke need for emergency CABG local vascular complications etc. and consents for PCI.  Charolette Forward 08/04/2015, 9:46 PM

## 2015-08-04 NOTE — ED Notes (Signed)
The pt has had burping also for the past week that went away  When she took. Antiacids the burping went away

## 2015-08-04 NOTE — Progress Notes (Signed)
ANTICOAGULATION CONSULT NOTE - Initial Consult  Pharmacy Consult for Heparin Indication: ACS/STEMI  No Known Allergies  Patient Measurements:    Total: 68.6kg Ideal: 61.4kg  Vital Signs: Temp: 97.7 F (36.5 C) (01/06 1517) Temp Source: Oral (01/06 1517) BP: 100/40 mmHg (01/06 1517) Pulse Rate: 60 (01/06 1517)  Labs:  Recent Labs  08/04/15 1514  HGB 11.5*  HCT 35.7*  PLT 265    CrCl cannot be calculated (Unknown ideal weight.).   Medical History: Past Medical History  Diagnosis Date  . Anxiety     Medications:   (Not in a hospital admission) Scheduled:  . aspirin  324 mg Oral Once  . ondansetron       Infusions:    Assessment: 75yo female w/ intermittent episodes of mid dermal chest pain over past week. Pharmacy consulted to dose Heparin for ACS/STEMI. Trops 2.94, sCr 0.65, Hgb 11.5   Goal of Therapy:  Heparin level 0.3-0.7 units/ml Monitor platelets by anticoagulation protocol: Yes   Plan:  Give 4000 units bolus x 1 Start heparin infusion at 800 units/hr Check anti-Xa level in 8 hours and daily while on heparin Continue to monitor H&H and platelets  Taro Hidrogo 08/04/2015,4:06 PM

## 2015-08-04 NOTE — ED Notes (Signed)
Dr Sharyn Lullharwani in to see.  Pt going to the cath lab  On bed pan

## 2015-08-04 NOTE — ED Notes (Signed)
Unable to start nitro drip  Bop too low  No pain at present

## 2015-08-04 NOTE — ED Notes (Signed)
Heparin dose verified by Efraim Kaufmannmelissa s rn

## 2015-08-04 NOTE — ED Notes (Signed)
Pt here for intermittent episodes of mid sternal chest pain over the past week relieved with burping. sts today she woke up with it and no relief.

## 2015-08-04 NOTE — ED Notes (Signed)
The pt has had intermittent chest discomfort  Intermittently for several weeks  .  It has been going away.  Today it was worse with sob,  No pain at present.

## 2015-08-05 DIAGNOSIS — Z8249 Family history of ischemic heart disease and other diseases of the circulatory system: Secondary | ICD-10-CM | POA: Diagnosis not present

## 2015-08-05 DIAGNOSIS — I2510 Atherosclerotic heart disease of native coronary artery without angina pectoris: Secondary | ICD-10-CM | POA: Diagnosis not present

## 2015-08-05 DIAGNOSIS — Z23 Encounter for immunization: Secondary | ICD-10-CM | POA: Diagnosis not present

## 2015-08-05 DIAGNOSIS — R69 Illness, unspecified: Secondary | ICD-10-CM | POA: Diagnosis not present

## 2015-08-05 DIAGNOSIS — I214 Non-ST elevation (NSTEMI) myocardial infarction: Secondary | ICD-10-CM | POA: Diagnosis not present

## 2015-08-05 DIAGNOSIS — K5900 Constipation, unspecified: Secondary | ICD-10-CM | POA: Diagnosis not present

## 2015-08-05 DIAGNOSIS — I728 Aneurysm of other specified arteries: Secondary | ICD-10-CM | POA: Diagnosis not present

## 2015-08-05 DIAGNOSIS — D62 Acute posthemorrhagic anemia: Secondary | ICD-10-CM | POA: Diagnosis not present

## 2015-08-05 DIAGNOSIS — R739 Hyperglycemia, unspecified: Secondary | ICD-10-CM | POA: Diagnosis not present

## 2015-08-05 LAB — LIPID PANEL
Cholesterol: 135 mg/dL (ref 0–200)
HDL: 22 mg/dL — ABNORMAL LOW (ref >40)
LDL Cholesterol: 96 mg/dL (ref 0–99)
Total CHOL/HDL Ratio: 6.1 RATIO
Triglycerides: 86 mg/dL (ref <150)
VLDL: 17 mg/dL (ref 0–40)

## 2015-08-05 LAB — CBC
HCT: 29.9 % — ABNORMAL LOW (ref 36.0–46.0)
Hemoglobin: 9.5 g/dL — ABNORMAL LOW (ref 12.0–15.0)
MCH: 28.5 pg (ref 26.0–34.0)
MCHC: 31.8 g/dL (ref 30.0–36.0)
MCV: 89.8 fL (ref 78.0–100.0)
Platelets: 220 10*3/uL (ref 150–400)
RBC: 3.33 MIL/uL — ABNORMAL LOW (ref 3.87–5.11)
RDW: 12.7 % (ref 11.5–15.5)
WBC: 7 10*3/uL (ref 4.0–10.5)

## 2015-08-05 LAB — BASIC METABOLIC PANEL
Anion gap: 6 (ref 5–15)
BUN: <5 mg/dL — ABNORMAL LOW (ref 6–20)
CO2: 25 mmol/L (ref 22–32)
Calcium: 8.5 mg/dL — ABNORMAL LOW (ref 8.9–10.3)
Chloride: 112 mmol/L — ABNORMAL HIGH (ref 101–111)
Creatinine, Ser: 0.6 mg/dL (ref 0.44–1.00)
GFR calc Af Amer: >60 mL/min (ref >60)
GFR calc non Af Amer: >60 mL/min (ref >60)
Glucose, Bld: 112 mg/dL — ABNORMAL HIGH (ref 65–99)
Potassium: 4.5 mmol/L (ref 3.5–5.1)
Sodium: 143 mmol/L (ref 135–145)

## 2015-08-05 LAB — POCT ACTIVATED CLOTTING TIME: Activated Clotting Time: 147 seconds

## 2015-08-05 LAB — TROPONIN I
Troponin I: 55.26 ng/mL (ref <0.031)
Troponin I: 30.9 ng/mL (ref <0.031)

## 2015-08-05 LAB — MRSA PCR SCREENING: MRSA by PCR: NEGATIVE

## 2015-08-05 LAB — TSH: TSH: 1.795 u[IU]/mL (ref 0.350–4.500)

## 2015-08-05 MED ORDER — ATROPINE SULFATE 0.1 MG/ML IJ SOLN
INTRAMUSCULAR | Status: AC
  Filled 2015-08-05: qty 10

## 2015-08-05 NOTE — Progress Notes (Signed)
CRITICAL VALUE ALERT  Critical value received: Troponin >65  Date of notification:  08/04/2015  Time of notification:  2359  Critical value read back:Yes.    Nurse who received alert:  Lexi  MD notified (1st page):  Dr. Sharyn Lullharwani aware  Time of first page:    MD notified (2nd page):  Time of second page:  Responding MD:    Time MD responded:

## 2015-08-05 NOTE — Progress Notes (Signed)
Subjective:  Doing well denies any chest pain or shortness of breath. States feels better after complex PCI to RCA  Objective:  Vital Signs in the last 24 hours: Temp:  [97.6 F (36.4 C)-98.8 F (37.1 C)] 98.3 F (36.8 C) (01/07 1100) Pulse Rate:  [0-98] 68 (01/07 1100) Resp:  [0-25] 17 (01/07 1200) BP: (85-140)/(37-82) 127/38 mmHg (01/07 1200) SpO2:  [0 %-100 %] 98 % (01/07 1100) Arterial Line BP: (107-123)/(44-54) 107/44 mmHg (01/07 0035) Weight:  [66 kg (145 lb 8.1 oz)-68.493 kg (151 lb)] 66 kg (145 lb 8.1 oz) (01/06 2215)  Intake/Output from previous day: 01/06 0701 - 01/07 0700 In: 2517.2 [P.O.:180; I.V.:2337.2] Out: 950 [Urine:950] Intake/Output from this shift: Total I/O In: 306 [I.V.:306] Out: 1500 [Urine:1500]  Physical Exam: Neck: no adenopathy, no carotid bruit, no JVD and supple, symmetrical, trachea midline Lungs: clear to auscultation bilaterally Heart: regular rate and rhythm, S1, S2 normal and Soft systolic murmur noted no S3 gallop Abdomen: soft, non-tender; bowel sounds normal; no masses,  no organomegaly Extremities: extremities normal, atraumatic, no cyanosis or edema and Right groin stable with no evidence of hematoma dressing dry  Lab Results:  Recent Labs  08/04/15 2305 08/05/15 0603  WBC 6.9 7.0  HGB 10.0* 9.5*  PLT 230 220    Recent Labs  08/04/15 2305 08/05/15 0603  NA 140 143  K 3.5 4.5  CL 107 112*  CO2 24 25  GLUCOSE 123* 112*  BUN <5* <5*  CREATININE 0.48 0.60    Recent Labs  08/04/15 2305 08/05/15 0603  TROPONINI >65.00* 55.26*   Hepatic Function Panel  Recent Labs  08/04/15 2305  PROT 5.4*  ALBUMIN 2.9*  AST 149*  ALT 32  ALKPHOS 76  BILITOT 0.4    Recent Labs  08/05/15 0603  CHOL 135   No results for input(s): PROTIME in the last 72 hours.  Imaging: Imaging results have been reviewed and Dg Chest Portable 1 View  08/04/2015  CLINICAL DATA:  Intermittent episodes of midsternal chest pain over the past  week relieved with burping. EXAM: PORTABLE CHEST 1 VIEW COMPARISON:  Chest x-ray dated 10/20/2011. FINDINGS: Heart size is normal. Overall cardiomediastinal silhouette is stable in size and configuration. There is stable mild pleural thickening at each lung apex. Mild scarring/ fibrosis again noted at the lung bases. No new lung findings. No evidence of pneumonia. No pleural effusion. No pneumothorax. Osseous structures about the chest are unremarkable. IMPRESSION: Stable chest x-ray. No evidence of acute cardiopulmonary abnormality. Electronically Signed   By: Bary RichardStan  Maynard M.D.   On: 08/04/2015 16:31    Cardiac Studies:  Assessment/Plan:  Status post acute non-Q-wave myocardial infarction status post PCI to PDA/PLV branch and RCA requiring total 3 stents with excellent angiographic results Multivessel coronary artery disease Elevated blood sugar rule out diabetes Depression Anxiety disorder History of migraine headache Positive family history of coronary artery disease Acute on chronic anemia secondary to hydration and blood loss during the procedure Plan Continue present management Check stool for occult blood Check labs in a.m. May transfer to stepdown if bed needed Will schedule for staged PCI to circumflex and LAD and diagonal early next week. Discussed with patient and her son and agrees  LOS: 1 day    [name], Bufford Helms 08/05/2015, 12:08 PM

## 2015-08-05 NOTE — Progress Notes (Signed)
Pts right femoral sheath pulled at 0035, two RNs at bedside, pressure held for 20 minutes, vital signs stable throughout. Pt educated site level 0. Pressure dressing applied. Will continue to monitor closely.

## 2015-08-06 LAB — BASIC METABOLIC PANEL
Anion gap: 6 (ref 5–15)
BUN: 11 mg/dL (ref 6–20)
CO2: 27 mmol/L (ref 22–32)
Calcium: 8.6 mg/dL — ABNORMAL LOW (ref 8.9–10.3)
Chloride: 109 mmol/L (ref 101–111)
Creatinine, Ser: 0.66 mg/dL (ref 0.44–1.00)
GFR calc Af Amer: >60 mL/min (ref >60)
GFR calc non Af Amer: >60 mL/min (ref >60)
Glucose, Bld: 109 mg/dL — ABNORMAL HIGH (ref 65–99)
Potassium: 4.5 mmol/L (ref 3.5–5.1)
Sodium: 142 mmol/L (ref 135–145)

## 2015-08-06 LAB — CBC
HCT: 27.4 % — ABNORMAL LOW (ref 36.0–46.0)
Hemoglobin: 8.6 g/dL — ABNORMAL LOW (ref 12.0–15.0)
MCH: 28.9 pg (ref 26.0–34.0)
MCHC: 31.4 g/dL (ref 30.0–36.0)
MCV: 91.9 fL (ref 78.0–100.0)
Platelets: 190 10*3/uL (ref 150–400)
RBC: 2.98 MIL/uL — ABNORMAL LOW (ref 3.87–5.11)
RDW: 12.8 % (ref 11.5–15.5)
WBC: 7.7 10*3/uL (ref 4.0–10.5)

## 2015-08-06 LAB — TROPONIN I: Troponin I: 19.5 ng/mL (ref <0.031)

## 2015-08-06 MED ORDER — MAGNESIUM HYDROXIDE 400 MG/5ML PO SUSP
30.0000 mL | Freq: Two times a day (BID) | ORAL | Status: DC | PRN
  Administered 2015-08-06 – 2015-08-07 (×2): 30 mL via ORAL
  Filled 2015-08-06 (×2): qty 30

## 2015-08-06 MED ORDER — LORATADINE 10 MG PO TABS
10.0000 mg | ORAL_TABLET | Freq: Every day | ORAL | Status: DC
  Administered 2015-08-06 – 2015-08-11 (×5): 10 mg via ORAL
  Filled 2015-08-06 (×5): qty 1

## 2015-08-06 MED ORDER — FERROUS SULFATE 325 (65 FE) MG PO TABS
325.0000 mg | ORAL_TABLET | Freq: Three times a day (TID) | ORAL | Status: DC
  Administered 2015-08-06 – 2015-08-11 (×13): 325 mg via ORAL
  Filled 2015-08-06 (×12): qty 1

## 2015-08-06 MED ORDER — MAGNESIUM HYDROXIDE 400 MG/5ML PO SUSP: 30.0000 mL | Freq: Every day | ORAL | Status: DC | PRN

## 2015-08-06 NOTE — Progress Notes (Signed)
Subjective:  Patient denies any chest pain or shortness of breath. Has not had BM yet.  Objective:  Vital Signs in the last 24 hours: Temp:  [97.9 F (36.6 C)-98.6 F (37 C)] 98.6 F (37 C) (01/08 0700) Pulse Rate:  [57-87] 57 (01/08 1100) Resp:  [12-22] 22 (01/08 1100) BP: (72-127)/(33-65) 108/51 mmHg (01/08 1002) SpO2:  [79 %-100 %] 79 % (01/08 1100)  Intake/Output from previous day: 01/07 0701 - 01/08 0700 In: 606 [P.O.:300; I.V.:306] Out: 3300 [Urine:3300] Intake/Output from this shift: Total I/O In: 240 [P.O.:240] Out: 350 [Urine:350]  Physical Exam: Neck: no adenopathy, no carotid bruit, no JVD and supple, symmetrical, trachea midline Lungs: clear to auscultation bilaterally Heart: regular rate and rhythm, S1, S2 normal and Soft systolic murmur noted no S3 gallop Abdomen: soft, non-tender; bowel sounds normal; no masses,  no organomegaly Extremities: extremities normal, atraumatic, no cyanosis or edema  Lab Results:  Recent Labs  08/05/15 0603 08/06/15 0223  WBC 7.0 7.7  HGB 9.5* 8.6*  PLT 220 190    Recent Labs  08/05/15 0603 08/06/15 0223  NA 143 142  K 4.5 4.5  CL 112* 109  CO2 25 27  GLUCOSE 112* 109*  BUN <5* 11  CREATININE 0.60 0.66    Recent Labs  08/05/15 1316 08/06/15 0223  TROPONINI 30.90* 19.50*   Hepatic Function Panel  Recent Labs  08/04/15 2305  PROT 5.4*  ALBUMIN 2.9*  AST 149*  ALT 32  ALKPHOS 76  BILITOT 0.4    Recent Labs  08/05/15 0603  CHOL 135   No results for input(s): PROTIME in the last 72 hours.  Imaging: Imaging results have been reviewed and Dg Chest Portable 1 View  08/04/2015  CLINICAL DATA:  Intermittent episodes of midsternal chest pain over the past week relieved with burping. EXAM: PORTABLE CHEST 1 VIEW COMPARISON:  Chest x-ray dated 10/20/2011. FINDINGS: Heart size is normal. Overall cardiomediastinal silhouette is stable in size and configuration. There is stable mild pleural thickening at each  lung apex. Mild scarring/ fibrosis again noted at the lung bases. No new lung findings. No evidence of pneumonia. No pleural effusion. No pneumothorax. Osseous structures about the chest are unremarkable. IMPRESSION: Stable chest x-ray. No evidence of acute cardiopulmonary abnormality. Electronically Signed   By: Bary RichardStan  Maynard M.D.   On: 08/04/2015 16:31    Cardiac Studies:  Assessment/Plan:  Status post acute non-Q-wave myocardial infarction status post PCI to PDA/PLV branch and RCA requiring total 3 stents with excellent angiographic results Multivessel coronary artery disease Elevated blood sugar rule out diabetes Depression Anxiety disorder History of migraine headache Positive family history of coronary artery disease Acute on chronic anemia secondary to hydration and blood loss during the procedure rule out GI loss Plan Continue present management Check stool for occult blood Start Feosol Rx for constipation Check labs in a.m.  LOS: 2 days    Tiffany Mack, [name] 08/06/2015, 11:29 AM

## 2015-08-07 ENCOUNTER — Encounter (HOSPITAL_COMMUNITY): Payer: Self-pay | Admitting: Cardiology

## 2015-08-07 LAB — BASIC METABOLIC PANEL
Anion gap: 9 (ref 5–15)
BUN: 8 mg/dL (ref 6–20)
CO2: 26 mmol/L (ref 22–32)
Calcium: 8.6 mg/dL — ABNORMAL LOW (ref 8.9–10.3)
Chloride: 107 mmol/L (ref 101–111)
Creatinine, Ser: 0.6 mg/dL (ref 0.44–1.00)
GFR calc Af Amer: >60 mL/min (ref >60)
GFR calc non Af Amer: >60 mL/min (ref >60)
Glucose, Bld: 98 mg/dL (ref 65–99)
Potassium: 3.5 mmol/L (ref 3.5–5.1)
Sodium: 142 mmol/L (ref 135–145)

## 2015-08-07 LAB — HEMOGLOBIN A1C
Hgb A1c MFr Bld: 5.6 % (ref 4.8–5.6)
Mean Plasma Glucose: 114 mg/dL

## 2015-08-07 LAB — CBC
HCT: 28.6 % — ABNORMAL LOW (ref 36.0–46.0)
Hemoglobin: 9 g/dL — ABNORMAL LOW (ref 12.0–15.0)
MCH: 28.7 pg (ref 26.0–34.0)
MCHC: 31.5 g/dL (ref 30.0–36.0)
MCV: 91.1 fL (ref 78.0–100.0)
Platelets: 213 10*3/uL (ref 150–400)
RBC: 3.14 MIL/uL — ABNORMAL LOW (ref 3.87–5.11)
RDW: 12.8 % (ref 11.5–15.5)
WBC: 7.1 10*3/uL (ref 4.0–10.5)

## 2015-08-07 LAB — TROPONIN I: Troponin I: 13.63 ng/mL (ref <0.031)

## 2015-08-07 LAB — POCT ACTIVATED CLOTTING TIME: Activated Clotting Time: 317 seconds

## 2015-08-07 MED ORDER — SODIUM CHLORIDE 0.9 % IJ SOLN
3.0000 mL | Freq: Two times a day (BID) | INTRAMUSCULAR | Status: DC
  Administered 2015-08-07: 3 mL via INTRAVENOUS

## 2015-08-07 MED ORDER — SODIUM CHLORIDE 0.9 % IJ SOLN: 3.0000 mL | INTRAMUSCULAR | Status: DC | PRN

## 2015-08-07 MED ORDER — BISACODYL 5 MG PO TBEC
5.0000 mg | DELAYED_RELEASE_TABLET | Freq: Every day | ORAL | Status: DC | PRN
  Administered 2015-08-07 (×2): 5 mg via ORAL
  Filled 2015-08-07 (×2): qty 1

## 2015-08-07 MED ORDER — SODIUM CHLORIDE 0.9 % WEIGHT BASED INFUSION
1.0000 mL/kg/h | INTRAVENOUS | Status: DC
  Administered 2015-08-07: 1 mL/kg/h via INTRAVENOUS
  Administered 2015-08-08 (×2): 250 mL via INTRAVENOUS

## 2015-08-07 MED ORDER — BISACODYL 10 MG RE SUPP: 10.0000 mg | Freq: Every day | RECTAL | Status: DC | PRN

## 2015-08-07 MED ORDER — SODIUM CHLORIDE 0.9 % IV SOLN: 250.0000 mL | INTRAVENOUS | Status: DC | PRN

## 2015-08-07 NOTE — Care Management Note (Signed)
Case Management Note  Patient Details  Name: [name] Johnson Carter MRN: [id number] Date of Birth: January 06, 1941  Subjective/Objective:     Adm w mi               Action/Plan: lives w fam   Expected Discharge Date:                 Expected Discharge Plan:  Home/Self Care  In-House Referral:     Discharge planning Services  CM Consult, Medication Assistance  Post Acute Care Choice:    Choice offered to:     DME Arranged:    DME Agency:     HH Arranged:    HH Agency:     Status of Service:     Medicare Important Message Given:    Date Medicare IM Given:    Medicare IM give by:    Date Additional Medicare IM Given:    Additional Medicare Important Message give by:     If discussed at Long Length of Stay Meetings, dates discussed:    Additional Comments:left pt 30day free brilinta card.  Hanley Haysowell, Deshunda Thackston T, RN 08/07/2015, 11:01 AM

## 2015-08-07 NOTE — Progress Notes (Addendum)
CARDIAC REHAB PHASE I   PRE:  Rate/Rhythm: 76 SR    BP: sitting 113/43    SaO2:   MODE:  Ambulation: 350 ft   POST:  Rate/Rhythm: 91 SR    BP: sitting 112/30     SaO2:   We did not receive order post PCI for some reason. Wrote order for CRPI per MI protocol. Pt thankful to walk. Tolerated well although her legs felt slightly weak from inactivity. Began discussing ed with pt. Very receptive. Sts she has been dealing with depression for the last year but she has recently been feeling like she is moving out of it. She is eager to do CRPII and I will send [facility] to [facility]. Will f/u tomorrow or after PCI. Could not locate pts stent card.  [id number]   Elissa LovettReeve, Dustine Bertini SalomeKristan CES, ACSM 08/07/2015 11:40 AM

## 2015-08-07 NOTE — Care Management Important Message (Signed)
Important Message  Patient Details  Name: Tiffany Mack MRN: [id number] Date of Birth: 06-Jun-1941   Medicare Important Message Given:  Yes    Rayvon CharSTUTTS, Iridessa Harrow G 08/07/2015, 12:37 PMImportant Message  Patient Details  Name: Tiffany Mack MRN: [id number] Date of Birth: 06-Jun-1941   Medicare Important Message Given:  Yes    Kwabena Strutz G 08/07/2015, 12:36 PM

## 2015-08-07 NOTE — Progress Notes (Signed)
Subjective:  Doing well denies any chest pain or shortness of breath. Complains of constipation  Objective:  Vital Signs in the last 24 hours: Temp:  [97.8 F (36.6 C)-98.2 F (36.8 C)] 98.2 F (36.8 C) (01/09 1216) Pulse Rate:  [66-90] 81 (01/09 1216) Resp:  [12-23] 18 (01/09 1216) BP: (79-121)/(33-88) 91/40 mmHg (01/09 1216) SpO2:  [93 %-100 %] 100 % (01/09 1216)  Intake/Output from previous day: 01/08 0701 - 01/09 0700 In: 1285 [P.O.:1285] Out: 1150 [Urine:1150] Intake/Output from this shift: Total I/O In: 240 [P.O.:240] Out: 350 [Urine:350]  Physical Exam: Neck: no adenopathy, no carotid bruit, no JVD and supple, symmetrical, trachea midline Lungs: clear to auscultation bilaterally Heart: regular rate and rhythm, S1, S2 normal and Soft systolic murmur noted Abdomen: soft, non-tender; bowel sounds normal; no masses,  no organomegaly Extremities: extremities normal, atraumatic, no cyanosis or edema and Right groin stable  Lab Results:  Recent Labs  08/06/15 0223 08/07/15 0220  WBC 7.7 7.1  HGB 8.6* 9.0*  PLT 190 213    Recent Labs  08/06/15 0223 08/07/15 0220  NA 142 142  K 4.5 3.5  CL 109 107  CO2 27 26  GLUCOSE 109* 98  BUN 11 8  CREATININE 0.66 0.60    Recent Labs  08/06/15 0223 08/07/15 0220  TROPONINI 19.50* 13.63*   Hepatic Function Panel  Recent Labs  08/04/15 2305  PROT 5.4*  ALBUMIN 2.9*  AST 149*  ALT 32  ALKPHOS 76  BILITOT 0.4    Recent Labs  08/05/15 0603  CHOL 135   No results for input(s): PROTIME in the last 72 hours.  Imaging: Imaging results have been reviewed and No results found.  Cardiac Studies:  Assessment/Plan:  Status post acute non-Q-wave myocardial infarction status post PCI to PDA/PLV branch and RCA requiring total 3 stents with excellent angiographic results Multivessel coronary artery disease Elevated blood sugar rule out diabetes Depression Anxiety disorder History of migraine  headache Positive family history of coronary artery disease Acute on chronic anemia secondary to hydration and blood loss during the procedure rule out GI loss Plan Discussed with patient and her sister regarding she looked angiogram of right and then PTCA stenting to left circumflex and LAD and diagonal in its risk and benefits and consents for PCI  LOS: 3 days    [name], Sentoria Brent 08/07/2015, 12:36 PM

## 2015-08-08 ENCOUNTER — Encounter (HOSPITAL_COMMUNITY)
Admission: EM | Disposition: A | Discharge status: Home / Self Care | Payer: Self-pay | Source: Home / Self Care | Attending: Cardiology

## 2015-08-08 HISTORY — PX: CARDIAC CATHETERIZATION: SHX172

## 2015-08-08 LAB — CBC
HCT: 28.2 % — ABNORMAL LOW (ref 36.0–46.0)
Hemoglobin: 8.9 g/dL — ABNORMAL LOW (ref 12.0–15.0)
MCH: 28.8 pg (ref 26.0–34.0)
MCHC: 31.6 g/dL (ref 30.0–36.0)
MCV: 91.3 fL (ref 78.0–100.0)
Platelets: 225 10*3/uL (ref 150–400)
RBC: 3.09 MIL/uL — ABNORMAL LOW (ref 3.87–5.11)
RDW: 12.8 % (ref 11.5–15.5)
WBC: 7.2 10*3/uL (ref 4.0–10.5)

## 2015-08-08 LAB — POCT ACTIVATED CLOTTING TIME: Activated Clotting Time: 327 seconds

## 2015-08-08 LAB — OCCULT BLOOD X 1 CARD TO LAB, STOOL: Fecal Occult Bld: POSITIVE — AB

## 2015-08-08 LAB — PROTIME-INR
INR: 1.14 (ref 0.00–1.49)
Prothrombin Time: 14.8 seconds (ref 11.6–15.2)

## 2015-08-08 SURGERY — CORONARY STENT INTERVENTION

## 2015-08-08 MED ORDER — SODIUM CHLORIDE 0.9 % IV SOLN: INTRAVENOUS | Status: DC

## 2015-08-08 MED ORDER — SODIUM CHLORIDE 0.9 % IV SOLN
250.0000 mg | INTRAVENOUS | Status: DC | PRN
  Administered 2015-08-08 (×2): 1.75 mg/kg/h via INTRAVENOUS

## 2015-08-08 MED ORDER — BIVALIRUDIN 250 MG IV SOLR
INTRAVENOUS | Status: AC
  Filled 2015-08-08: qty 250

## 2015-08-08 MED ORDER — BIVALIRUDIN BOLUS VIA INFUSION - CUPID
INTRAVENOUS | Status: DC | PRN
  Administered 2015-08-08: 49.5 mg via INTRAVENOUS

## 2015-08-08 MED ORDER — LIDOCAINE HCL (PF) 1 % IJ SOLN
INTRAMUSCULAR | Status: AC
  Filled 2015-08-08: qty 30

## 2015-08-08 MED ORDER — SODIUM CHLORIDE 0.9 % IJ SOLN
3.0000 mL | Freq: Two times a day (BID) | INTRAMUSCULAR | Status: DC
Start: 2015-08-08 — End: 2015-08-11
  Administered 2015-08-10: 21:00:00 3 mL via INTRAVENOUS

## 2015-08-08 MED ORDER — NITROGLYCERIN 1 MG/10 ML FOR IR/CATH LAB
INTRA_ARTERIAL | Status: DC | PRN
  Administered 2015-08-08: 150 ug via INTRACORONARY
  Administered 2015-08-08 (×2): 100 ug via INTRACORONARY

## 2015-08-08 MED ORDER — SODIUM CHLORIDE 0.9 % IV SOLN: 250.0000 mL | INTRAVENOUS | Status: DC | PRN

## 2015-08-08 MED ORDER — NITROGLYCERIN 1 MG/10 ML FOR IR/CATH LAB
INTRA_ARTERIAL | Status: AC
  Filled 2015-08-08: qty 10

## 2015-08-08 MED ORDER — MIDAZOLAM HCL 2 MG/2ML IJ SOLN
INTRAMUSCULAR | Status: AC
  Filled 2015-08-08: qty 2

## 2015-08-08 MED ORDER — SODIUM CHLORIDE 0.9 % IJ SOLN
3.0000 mL | INTRAMUSCULAR | Status: DC | PRN
  Administered 2015-08-10: 3 mL via INTRAVENOUS
  Filled 2015-08-08: qty 3

## 2015-08-08 MED ORDER — NITROGLYCERIN 1 MG/10 ML FOR IR/CATH LAB
INTRA_ARTERIAL | Status: DC | PRN
  Administered 2015-08-08: 10:00:00

## 2015-08-08 MED ORDER — IOHEXOL 350 MG/ML SOLN
INTRAVENOUS | Status: DC | PRN
  Administered 2015-08-08: 225 mL via INTRAVENOUS

## 2015-08-08 MED ORDER — FENTANYL CITRATE (PF) 100 MCG/2ML IJ SOLN
INTRAMUSCULAR | Status: AC
  Filled 2015-08-08: qty 2

## 2015-08-08 MED ORDER — MIDAZOLAM HCL 2 MG/2ML IJ SOLN
INTRAMUSCULAR | Status: DC | PRN
  Administered 2015-08-08: 1 mg via INTRAVENOUS

## 2015-08-08 MED ORDER — ONDANSETRON HCL 4 MG/2ML IJ SOLN: 4.0000 mg | Freq: Four times a day (QID) | INTRAMUSCULAR | Status: DC | PRN

## 2015-08-08 MED ORDER — FENTANYL CITRATE (PF) 100 MCG/2ML IJ SOLN
INTRAMUSCULAR | Status: DC | PRN
  Administered 2015-08-08: 25 ug via INTRAVENOUS

## 2015-08-08 SURGICAL SUPPLY — 24 items
BALLN ANGIOSCULPT RX 2.5X6 (BALLOONS) ×2
BALLN EMERGE MR 2.0X12 (BALLOONS) ×2
BALLN EMERGE MR 2.5X12 (BALLOONS) ×2
BALLN ~~LOC~~ EMERGE MR 2.75X20 (BALLOONS) ×2
BALLN ~~LOC~~ EMERGE MR 3.0X20 (BALLOONS) ×2
BALLOON ANGIOSCULPT RX 2.5X6 (BALLOONS) ×1 IMPLANT
BALLOON EMERGE MR 2.0X12 (BALLOONS) ×1 IMPLANT
BALLOON EMERGE MR 2.5X12 (BALLOONS) ×1 IMPLANT
BALLOON ~~LOC~~ EMERGE MR 2.75X20 (BALLOONS) ×1 IMPLANT
BALLOON ~~LOC~~ EMERGE MR 3.0X20 (BALLOONS) ×1 IMPLANT
CATH INFINITI JR4 5F (CATHETERS) ×2 IMPLANT
GUIDE CATH MACH 1 7F VL3 (CATHETERS) ×2 IMPLANT
KIT ENCORE 26 ADVANTAGE (KITS) ×2 IMPLANT
KIT HEART LEFT (KITS) ×2 IMPLANT
PACK CARDIAC CATHETERIZATION (CUSTOM PROCEDURE TRAY) ×2 IMPLANT
SHEATH PINNACLE 7F 10CM (SHEATH) ×2 IMPLANT
STENT XIENCE ALPINE RX 2.5X15 (Permanent Stent) ×2 IMPLANT
STENT XIENCE ALPINE RX 2.75X33 (Permanent Stent) ×2 IMPLANT
STENT XIENCE ALPINE RX 3.0X33 (Permanent Stent) ×2 IMPLANT
TRANSDUCER W/STOPCOCK (MISCELLANEOUS) ×2 IMPLANT
TUBING CIL FLEX 10 FLL-RA (TUBING) ×2 IMPLANT
WIRE EMERALD 3MM-J .035X150CM (WIRE) ×2 IMPLANT
WIRE PT2 MS 185 (WIRE) ×2 IMPLANT
WIRE RUNTHROUGH .014X180CM (WIRE) ×2 IMPLANT

## 2015-08-08 NOTE — Interval H&P Note (Signed)
Cath Lab Visit (complete for each Cath Lab visit)  Clinical Evaluation Leading to the Procedure:   ACS: Yes.    Non-ACS:    Anginal Classification: CCS IV  Anti-ischemic medical therapy: Maximal Therapy (2 or more classes of medications)  Non-Invasive Test Results: No non-invasive testing performed  Prior CABG: No previous CABG      History and Physical Interval Note:  08/08/2015 7:36 AM  Tiffany Mack  has presented today for surgery, with the diagnosis of cad  The various methods of treatment have been discussed with the patient and family. After consideration of risks, benefits and other options for treatment, the patient has consented to  Procedure(s): Coronary Stent Intervention (N/A) as a surgical intervention .  The patient's history has been reviewed, patient examined, no change in status, stable for surgery.  I have reviewed the patient's chart and labs.  Questions were answered to the patient's satisfaction.     [name], Ledora Delker

## 2015-08-08 NOTE — H&P (View-Only) (Signed)
Subjective:  Doing well denies any chest pain or shortness of breath. Complains of constipation  Objective:  Vital Signs in the last 24 hours: Temp:  [97.8 F (36.6 C)-98.2 F (36.8 C)] 98.2 F (36.8 C) (01/09 1216) Pulse Rate:  [66-90] 81 (01/09 1216) Resp:  [12-23] 18 (01/09 1216) BP: (79-121)/(33-88) 91/40 mmHg (01/09 1216) SpO2:  [93 %-100 %] 100 % (01/09 1216)  Intake/Output from previous day: 01/08 0701 - 01/09 0700 In: 1285 [P.O.:1285] Out: 1150 [Urine:1150] Intake/Output from this shift: Total I/O In: 240 [P.O.:240] Out: 350 [Urine:350]  Physical Exam: Neck: no adenopathy, no carotid bruit, no JVD and supple, symmetrical, trachea midline Lungs: clear to auscultation bilaterally Heart: regular rate and rhythm, S1, S2 normal and Soft systolic murmur noted Abdomen: soft, non-tender; bowel sounds normal; no masses,  no organomegaly Extremities: extremities normal, atraumatic, no cyanosis or edema and Right groin stable  Lab Results:  Recent Labs  08/06/15 0223 08/07/15 0220  WBC 7.7 7.1  HGB 8.6* 9.0*  PLT 190 213    Recent Labs  08/06/15 0223 08/07/15 0220  NA 142 142  K 4.5 3.5  CL 109 107  CO2 27 26  GLUCOSE 109* 98  BUN 11 8  CREATININE 0.66 0.60    Recent Labs  08/06/15 0223 08/07/15 0220  TROPONINI 19.50* 13.63*   Hepatic Function Panel  Recent Labs  08/04/15 2305  PROT 5.4*  ALBUMIN 2.9*  AST 149*  ALT 32  ALKPHOS 76  BILITOT 0.4    Recent Labs  08/05/15 0603  CHOL 135   No results for input(s): PROTIME in the last 72 hours.  Imaging: Imaging results have been reviewed and No results found.  Cardiac Studies:  Assessment/Plan:  Status post acute non-Q-wave myocardial infarction status post PCI to PDA/PLV branch and RCA requiring total 3 stents with excellent angiographic results Multivessel coronary artery disease Elevated blood sugar rule out diabetes Depression Anxiety disorder History of migraine  headache Positive family history of coronary artery disease Acute on chronic anemia secondary to hydration and blood loss during the procedure rule out GI loss Plan Discussed with patient and her sister regarding she looked angiogram of right and then PTCA stenting to left circumflex and LAD and diagonal in its risk and benefits and consents for PCI  LOS: 3 days    [name], Vashaun Osmon 08/07/2015, 12:36 PM

## 2015-08-08 NOTE — Progress Notes (Signed)
Site area: Right groin a 6 french arterial sheath was removed  Site Prior to Removal:  Level 0  Pressure Applied For 15 MINUTES    Minutes Beginning at 1145a  Manual:   Yes.    Patient Status During Pull:  stable  Post Pull Groin Site:  Level 0  Post Pull Instructions Given:  Yes.    Post Pull Pulses Present:  Yes.    Dressing Applied:  Yes.    Comments:  VS remain stable during sheath pull.

## 2015-08-09 ENCOUNTER — Encounter (HOSPITAL_COMMUNITY): Payer: Self-pay | Admitting: Cardiology

## 2015-08-09 ENCOUNTER — Other Ambulatory Visit: Payer: Self-pay

## 2015-08-09 ENCOUNTER — Inpatient Hospital Stay (HOSPITAL_COMMUNITY): Payer: Medicare HMO

## 2015-08-09 DIAGNOSIS — T148 Other injury of unspecified body region: Secondary | ICD-10-CM

## 2015-08-09 LAB — CBC
HCT: 26.3 % — ABNORMAL LOW (ref 36.0–46.0)
Hemoglobin: 8.4 g/dL — ABNORMAL LOW (ref 12.0–15.0)
MCH: 28.7 pg (ref 26.0–34.0)
MCHC: 31.9 g/dL (ref 30.0–36.0)
MCV: 89.8 fL (ref 78.0–100.0)
Platelets: 239 10*3/uL (ref 150–400)
RBC: 2.93 MIL/uL — ABNORMAL LOW (ref 3.87–5.11)
RDW: 12.6 % (ref 11.5–15.5)
WBC: 7.2 10*3/uL (ref 4.0–10.5)

## 2015-08-09 LAB — BASIC METABOLIC PANEL
Anion gap: 11 (ref 5–15)
BUN: 8 mg/dL (ref 6–20)
CO2: 23 mmol/L (ref 22–32)
Calcium: 8.6 mg/dL — ABNORMAL LOW (ref 8.9–10.3)
Chloride: 109 mmol/L (ref 101–111)
Creatinine, Ser: 0.5 mg/dL (ref 0.44–1.00)
GFR calc Af Amer: >60 mL/min (ref >60)
GFR calc non Af Amer: >60 mL/min (ref >60)
Glucose, Bld: 109 mg/dL — ABNORMAL HIGH (ref 65–99)
Potassium: 3.9 mmol/L (ref 3.5–5.1)
Sodium: 143 mmol/L (ref 135–145)

## 2015-08-09 MED ORDER — HEART ATTACK BOUNCING BOOK
Freq: Once | Status: AC
Start: 2015-08-09 — End: 2015-08-09
  Administered 2015-08-09: 06:00:00
  Filled 2015-08-09: qty 1

## 2015-08-09 MED ORDER — MORPHINE SULFATE (PF) 10 MG/ML IV SOLN: 10.0000 mg | Freq: Once | INTRAVENOUS | Status: DC

## 2015-08-09 MED ORDER — ANGIOPLASTY BOOK
Freq: Once | Status: AC
Start: 2015-08-09 — End: 2015-08-09
  Administered 2015-08-09: 06:00:00
  Filled 2015-08-09: qty 1

## 2015-08-09 MED ORDER — MORPHINE SULFATE (PF) 10 MG/ML IV SOLN
10.0000 mg | Freq: Once | INTRAVENOUS | Status: AC
  Administered 2015-08-09: 4 mg via INTRAVENOUS
  Filled 2015-08-09: qty 1

## 2015-08-09 MED ORDER — SODIUM CHLORIDE 0.9 % IV BOLUS (SEPSIS): 250.0000 mL | Freq: Once | INTRAVENOUS | Status: DC

## 2015-08-09 NOTE — Progress Notes (Signed)
US tech reported noting a pseudoaneurysm at the right femoral site. Dr. Sharyn LullHarwani notified and came to patients bedside to compress pseudoaneurysm with vascular tech. Began IV bolus of 250 mls at 1220. IV morphine 2 mg given slowly at 1223. 1225 started compression. Patient complained of pain, given 2 mg morphine IV per Dr. Annitta JerseyHarwani's instruction. 1227 patient verbalized feeling better. Deep breathing and relaxing, patient tolerating compression of right groin for 5 minutes well. Rechecked site and compression for 5 additional minutes, patient tolerated well with no complaints of pain and using deep breathing technique well.1235 NS bolus completed and started NS at 10 mls/hr as instructed. US after 2nd round of compression showed no pseudoaneurysm. Pressure dressing applied to right groin, patient given instructions for bedrest. Will recheck site later this evening with US. Dr Sharyn LullHarwani ordered 6 hours of bedrest. Patient verbalized understanding of instructions given.

## 2015-08-09 NOTE — Progress Notes (Signed)
Subjective:  Patient denies any chest pain or shortness of breath duplex ultrasound of right groin showed very small pseudoaneurysm which was compressed by Michelle's under ultrasound guidance earlier. Patient tolerated the procedure well. Patient denies any groin pain now.  Objective:  Vital Signs in the last 24 hours: Temp:  [98 F (36.7 C)-98.2 F (36.8 C)] 98 F (36.7 C) (01/11 1601) Pulse Rate:  [78-99] 80 (01/11 1601) Resp:  [8-20] 16 (01/11 1641) BP: (50-134)/(26-49) 105/34 mmHg (01/11 1641) SpO2:  [93 %-98 %] 98 % (01/11 1200) Weight:  [67 kg (147 lb 11.3 oz)] 67 kg (147 lb 11.3 oz) (01/11 0405)  Intake/Output from previous day: 01/10 0701 - 01/11 0700 In: 860 [P.O.:360; I.V.:500] Out: 1600 [Urine:1600] Intake/Output from this shift: Total I/O In: 600 [P.O.:600] Out: -   Physical Exam: Exam unchanged right groin dressing dry  Lab Results:  Recent Labs  08/08/15 0319 08/09/15 0349  WBC 7.2 7.2  HGB 8.9* 8.4*  PLT 225 239    Recent Labs  08/07/15 0220 08/09/15 0349  NA 142 143  K 3.5 3.9  CL 107 109  CO2 26 23  GLUCOSE 98 109*  BUN 8 8  CREATININE 0.60 0.50    Recent Labs  08/07/15 0220  TROPONINI 13.63*   Hepatic Function Panel No results for input(s): PROT, ALBUMIN, AST, ALT, ALKPHOS, BILITOT, BILIDIR, IBILI in the last 72 hours. No results for input(s): CHOL in the last 72 hours. No results for input(s): PROTIME in the last 72 hours.  Imaging: Imaging results have been reviewed and No results found.  Cardiac Studies:  Assessment/Plan:  Very small right groin hematoma with faint bruit rule out pseudoaneurysm Status post small pseudoaneurysm of the common femoral compressed  by ultrasound guidance Status post acute non-Q-wave myocardial infarction status post PCI to PDA/PLV branch and RCA requiring total 3 stents with excellent angiographic results Multivessel coronary artery disease status post PCI to LAD diagonal and left circumflex  system with excellent angiographic results Elevated blood sugar rule out diabetes Depression Anxiety disorder History of migraine headache Positive family history of coronary artery disease Acute on chronic anemia secondary to hydration and blood loss during the procedure rule out GI loss Plan Continue present management Recheck right groin ultrasound for evaluation of pseudoaneurysm closure  LOS: 5 days    [name], Welford Christmas 08/09/2015, 7:00 PM

## 2015-08-09 NOTE — Progress Notes (Signed)
Pt still on bedrest till u/s done. Discussed more ed with pt and sister. Voiced understanding. Pt and family would like HHRN to help with meds and check vitals initially. She lives alone and is forgetful sometimes therefore this is appropriate. Will f/u in am to ambulate and discuss ex at home.  [id number] Ethelda ChickKristan Arasely Akkerman CES, ACSM 11:52 AM  08/09/2015

## 2015-08-09 NOTE — Progress Notes (Signed)
Dr. Sharyn LullHarwani into see patient at 1845, instructed to keep patient on bedrest until am, straight cath patient now. Patient straight cathed for 850 mls clear yellow urine and tolerated well. Also instructed to elevate HOB for eating and drinking.

## 2015-08-09 NOTE — Progress Notes (Signed)
*  PRELIMINARY RESULTS* Vascular Ultrasound Limited Right Lower Extremity Arterial Duplex has been completed. There is evidence of a pseudoaneurysm of the right common femoral artery with a neck measuring 2mm at its smallest diameter.   Psuedoaneurysm compression has been completed. After 10 minutes of compression there is no evidence of a pseudoaneurysm. Common femoral artery is patent.  08/09/2015 12:45 PM Gertie FeyMichelle Esteen Delpriore, RVT, RDCS, RDMS

## 2015-08-09 NOTE — Progress Notes (Signed)
Subjective:  Patient developed small hematoma  this morning after coming out of the restroom. Denies any straining pressure was held for 10 minutes and pressure dressing applied. Patient denies any chest pain or shortness of breath. Underwent PCI to LAD diagonal and left circumflex system yesterday  Objective:  Vital Signs in the last 24 hours: Temp:  [98 F (36.7 C)-98.2 F (36.8 C)] 98.1 F (36.7 C) (01/11 0730) Pulse Rate:  [67-99] 88 (01/11 0730) Resp:  [12-20] 16 (01/11 1000) BP: (95-123)/(23-86) 98/31 mmHg (01/11 1000) SpO2:  [89 %-100 %] 96 % (01/11 0730) Weight:  [67 kg (147 lb 11.3 oz)] 67 kg (147 lb 11.3 oz) (01/11 0405)  Intake/Output from previous day: 01/10 0701 - 01/11 0700 In: 860 [P.O.:360; I.V.:500] Out: 1600 [Urine:1600] Intake/Output from this shift: Total I/O In: 240 [P.O.:240] Out: -   Physical Exam: Neck: no adenopathy, no carotid bruit, no JVD and supple, symmetrical, trachea midline Lungs: clear to auscultation bilaterally Heart: regular rate and rhythm, S1, S2 normal and Soft systolic murmur noted Abdomen: soft, non-tender; bowel sounds normal; no masses,  no organomegaly Extremities: extremities normal, atraumatic, no cyanosis or edema Right groin minimal hematoma and ecchymosis faint bruit noted Lab Results:  Recent Labs  08/08/15 0319 08/09/15 0349  WBC 7.2 7.2  HGB 8.9* 8.4*  PLT 225 239    Recent Labs  08/07/15 0220 08/09/15 0349  NA 142 143  K 3.5 3.9  CL 107 109  CO2 26 23  GLUCOSE 98 109*  BUN 8 8  CREATININE 0.60 0.50    Recent Labs  08/07/15 0220  TROPONINI 13.63*   Hepatic Function Panel No results for input(s): PROT, ALBUMIN, AST, ALT, ALKPHOS, BILITOT, BILIDIR, IBILI in the last 72 hours. No results for input(s): CHOL in the last 72 hours. No results for input(s): PROTIME in the last 72 hours.  Imaging: Imaging results have been reviewed and No results found.  Cardiac Studies:  Assessment/Plan:  Very small  right groin hematoma with faint bruit rule out pseudoaneurysm Status post acute non-Q-wave myocardial infarction status post PCI to PDA/PLV branch and RCA requiring total 3 stents with excellent angiographic results Multivessel coronary artery disease status post PCI to LAD diagonal and left circumflex system with excellent angiographic results Elevated blood sugar rule out diabetes Depression Anxiety disorder History of migraine headache Positive family history of coronary artery disease Acute on chronic anemia secondary to hydration and blood loss during the procedure rule out GI loss Plan Duplex ultrasound of the right groin rule out pseudoaneurysm Complete bedrest for now Check labs in a.m.  LOS: 5 days    [name], Naydelin Ziegler 08/09/2015, 12:07 PM

## 2015-08-09 NOTE — Progress Notes (Signed)
Went into Tiffany Mack's room to give am protonix at [phone] and straighten bed per her request and saw she had just developed a large hematoma to right groin. Groin had been a level 0 all night except for old bruising from previous cath to the same groin. Tiffany Mack had recently gotten up and walked to bathroom to have a bowel movement. Tiffany Mack did not complain until i touched it. Pressure held x 10 minutes by roselle rn and pressure drsg applied. Site is now soft. Tiffany Mack has been instructed that she will be on bedrest for 4 hrs and to keep right leg completely straight. BP 120/49-112/31. HR 80's. H?H 8.4/26.3.  Dr Sharyn LullHarwani paged and notified of events and instructed to keep Tiffany Mack on bedrest and he will see her later this morning.

## 2015-08-10 ENCOUNTER — Inpatient Hospital Stay (HOSPITAL_COMMUNITY): Payer: Medicare HMO

## 2015-08-10 DIAGNOSIS — I729 Aneurysm of unspecified site: Secondary | ICD-10-CM

## 2015-08-10 LAB — BASIC METABOLIC PANEL
Anion gap: 4 — ABNORMAL LOW (ref 5–15)
BUN: 8 mg/dL (ref 6–20)
CO2: 28 mmol/L (ref 22–32)
Calcium: 8.4 mg/dL — ABNORMAL LOW (ref 8.9–10.3)
Chloride: 107 mmol/L (ref 101–111)
Creatinine, Ser: 0.58 mg/dL (ref 0.44–1.00)
GFR calc Af Amer: >60 mL/min (ref >60)
GFR calc non Af Amer: >60 mL/min (ref >60)
Glucose, Bld: 113 mg/dL — ABNORMAL HIGH (ref 65–99)
Potassium: 3.5 mmol/L (ref 3.5–5.1)
Sodium: 139 mmol/L (ref 135–145)

## 2015-08-10 LAB — CBC
HCT: 23.2 % — ABNORMAL LOW (ref 36.0–46.0)
Hemoglobin: 7.3 g/dL — ABNORMAL LOW (ref 12.0–15.0)
MCH: 28.1 pg (ref 26.0–34.0)
MCHC: 31.5 g/dL (ref 30.0–36.0)
MCV: 89.2 fL (ref 78.0–100.0)
Platelets: 248 10*3/uL (ref 150–400)
RBC: 2.6 MIL/uL — ABNORMAL LOW (ref 3.87–5.11)
RDW: 12.8 % (ref 11.5–15.5)
WBC: 7.2 10*3/uL (ref 4.0–10.5)

## 2015-08-10 LAB — PREPARE RBC (CROSSMATCH)

## 2015-08-10 LAB — TROPONIN I: Troponin I: 3.26 ng/mL (ref <0.031)

## 2015-08-10 MED ORDER — SODIUM CHLORIDE 0.9 % IV SOLN: Freq: Once | INTRAVENOUS | Status: DC

## 2015-08-10 NOTE — Progress Notes (Signed)
Subjective:  Denies any chest pain or shortness of breath feels tired and fatigued. Ultrasound of the groin showed pseudoaneurysm remains closed. Patient and further significant drop in hemoglobin. Received 50 mL of normal saline bolus yesterday  Objective:  Vital Signs in the last 24 hours: Temp:  [97.9 F (36.6 C)-98.3 F (36.8 C)] 98 F (36.7 C) (01/12 0741) Pulse Rate:  [80-91] 89 (01/12 0741) Resp:  [8-20] 18 (01/12 0741) BP: (50-134)/(25-43) 108/40 mmHg (01/12 0741) SpO2:  [98 %] 98 % (01/12 0741) Weight:  [66.7 kg (147 lb 0.8 oz)] 66.7 kg (147 lb 0.8 oz) (01/12 0346)  Intake/Output from previous day: 01/11 0701 - 01/12 0700 In: 600 [P.O.:600] Out: 1650 [Urine:1650] Intake/Output from this shift:    Physical Exam: Neck: no adenopathy, no carotid bruit, no JVD and supple, symmetrical, trachea midline Lungs: clear to auscultation bilaterally Heart: regular rate and rhythm, S1, S2 normal and Soft systolic murmur noted Abdomen: soft, non-tender; bowel sounds normal; no masses,  no organomegaly Extremities: extremities normal, atraumatic, no cyanosis or edema and Right groin very small hematoma and no bruit  Lab Results:  Recent Labs  08/09/15 0349 08/10/15 0406  WBC 7.2 7.2  HGB 8.4* 7.3*  PLT 239 248    Recent Labs  08/09/15 0349 08/10/15 0406  NA 143 139  K 3.9 3.5  CL 109 107  CO2 23 28  GLUCOSE 109* 113*  BUN 8 8  CREATININE 0.50 0.58    Recent Labs  08/10/15 0406  TROPONINI 3.26*   Hepatic Function Panel No results for input(s): PROT, ALBUMIN, AST, ALT, ALKPHOS, BILITOT, BILIDIR, IBILI in the last 72 hours. No results for input(s): CHOL in the last 72 hours. No results for input(s): PROTIME in the last 72 hours.  Imaging: Imaging results have been reviewed and No results found.  Cardiac Studies:  Assessment/Plan:  Very small right groin hematoma  Status post small pseudoaneurysm of the common femoral compressed by ultrasound  guidance Status post acute non-Q-wave myocardial infarction status post PCI to PDA/PLV branch and RCA requiring total 3 stents with excellent angiographic results Multivessel coronary artery disease status post PCI to LAD diagonal and left circumflex system with excellent angiographic results Elevated blood sugar rule out diabetes Depression Anxiety disorder History of migraine headache Positive family history of coronary artery disease Acute on chronic anemia secondary to hydration and blood loss during the procedure rule out GI loss Plan Type and cross match and transfuse one unit of packed RBC in view of significant CAD multiple stents and recent MI check labs in a.m. Social service for discharge planning   LOS: 6 days    [name], Amairani Shuey 08/10/2015, 9:50 AM

## 2015-08-10 NOTE — Progress Notes (Signed)
CM placed benefits check regarding Brilinta: Pt's copay will be $242- prior auth not required. Information shared with pt  per CM and pt stated she can't afford medication. CM provided pt's nurse with an [place] patient [place] for MD to fill out to help offset copay cost with approval. Information explained to pt per CM. CM to f/u with pt in am. Gae GallopAngela Gottlieb Zuercher RN,BSN,CM [phone]

## 2015-08-10 NOTE — Progress Notes (Signed)
CARDIAC REHAB PHASE I   PRE:  Rate/Rhythm: 90 SR    BP: sitting 137/101    SaO2:   MODE:  Ambulation: 400 ft   POST:  Rate/Rhythm: 116 ST    BP: sitting 133/58     SaO2:   Pt up walking with RN, I took over. SOB and HR increased with distance. Pt very motivated but had to be instructed to rest at times due to SOB and fatigue. More pale today. Tired after walk. To recliner. Discussed walking at home. Reviewed Brilinta and NTG. Encouraged pt to get a bi-daily pill box and to set an alarm on her phone for a reminder to take her pills. She is eager to walk more later. [id number]   Elissa LovettReeve, Hughie Melroy EmmonsKristan CES, ACSM 08/10/2015 9:39 AM

## 2015-08-10 NOTE — Care Management Important Message (Signed)
Important Message  Patient Details  Name: Tiffany Mack MRN: [id number] Date of Birth: 1941-01-11   Medicare Important Message Given:  Yes    Tj Kitchings P Leonce Bale 08/10/2015, 1:37 PM

## 2015-08-10 NOTE — Progress Notes (Signed)
Patient attempted to void in the bedpan since she is in bedrest, but unable to void inspite of measures. Bladder noted some distention. Bladder scan and shows 779ml.  And patient appears to be uncomfortable. Dr. Sharyn LullHarwani was notified and made aware of patient symptoms with order to let patient get up and try to void without straining. Patient was successful voiding at the bedside commode and voided 800ml. And was good relief, maintained in bedrest.

## 2015-08-10 NOTE — Progress Notes (Signed)
VASCULAR LAB PRELIMINARY  PRELIMINARY  PRELIMINARY  PRELIMINARY  Right follow-up ultrasound of groin completed.    Preliminary report:  The pseudoaneurysm remains closed.    King Pinzon, RVT 08/10/2015, 9:27 AM

## 2015-08-11 LAB — ABO/RH: ABO/RH(D): B POS

## 2015-08-11 LAB — TYPE AND SCREEN
ABO/RH(D): B POS
Antibody Screen: NEGATIVE
Unit division: 0

## 2015-08-11 LAB — CBC
HCT: 27.6 % — ABNORMAL LOW (ref 36.0–46.0)
Hemoglobin: 9.1 g/dL — ABNORMAL LOW (ref 12.0–15.0)
MCH: 29.1 pg (ref 26.0–34.0)
MCHC: 33 g/dL (ref 30.0–36.0)
MCV: 88.2 fL (ref 78.0–100.0)
Platelets: 244 10*3/uL (ref 150–400)
RBC: 3.13 MIL/uL — ABNORMAL LOW (ref 3.87–5.11)
RDW: 13.1 % (ref 11.5–15.5)
WBC: 7.5 10*3/uL (ref 4.0–10.5)

## 2015-08-11 MED ORDER — BISACODYL 5 MG PO TBEC: 5.0000 mg | DELAYED_RELEASE_TABLET | Freq: Every day | ORAL | Status: DC | PRN

## 2015-08-11 MED ORDER — PANTOPRAZOLE SODIUM 40 MG PO TBEC: 40.0000 mg | DELAYED_RELEASE_TABLET | Freq: Every day | ORAL | Status: DC

## 2015-08-11 MED ORDER — TICAGRELOR 90 MG PO TABS: 90.0000 mg | ORAL_TABLET | Freq: Two times a day (BID) | ORAL | Status: DC

## 2015-08-11 MED ORDER — NITROGLYCERIN 0.4 MG SL SUBL: 0.4000 mg | SUBLINGUAL_TABLET | SUBLINGUAL | Status: DC | PRN

## 2015-08-11 MED ORDER — METOPROLOL TARTRATE 25 MG PO TABS: 12.5000 mg | ORAL_TABLET | Freq: Two times a day (BID) | ORAL | Status: DC

## 2015-08-11 MED ORDER — ASPIRIN 81 MG PO TBEC: 81.0000 mg | DELAYED_RELEASE_TABLET | Freq: Every day | ORAL | Status: DC

## 2015-08-11 MED ORDER — ATORVASTATIN CALCIUM 80 MG PO TABS
80.0000 mg | ORAL_TABLET | Freq: Every day | ORAL | Status: DC
Start: 2015-08-11 — End: 2023-09-17

## 2015-08-11 MED ORDER — FERROUS SULFATE 325 (65 FE) MG PO TABS: 325.0000 mg | ORAL_TABLET | Freq: Three times a day (TID) | ORAL | Status: DC

## 2015-08-11 NOTE — Progress Notes (Signed)
CM spoke with pt about home health services. MD ordered home health RN @ d/c. CM provided choice to pt and pt agreed with [facility] Gae GallopAngela Jalyssa Fleisher RN, [place],CM [phone]

## 2015-08-11 NOTE — Progress Notes (Signed)
CM spoke with pt this am to reinforce the pt's assistance form process for Brilinta. Pt verbally stated she understood. CM called [facility], [place] by CM and confirmed medication is in stock. Pt made aware by CM. Gae Gallopngela Jaretzi Droz RN,BSN,CM [phone]

## 2015-08-11 NOTE — Discharge Summary (Signed)
Discharge summary dictated on 08/11/2015 dictation number is [id number]

## 2015-08-11 NOTE — Progress Notes (Signed)
CARDIAC REHAB PHASE I   PRE:  Rate/Rhythm: 119/39    BP: sitting     SaO2:   MODE:  Ambulation: 900 ft   POST:  Rate/Rhythm: 106 ST    BP: sitting 143/47     SaO2:   Tolerated much better today. Some SOB but resolved with distance. HR more controlled and able to walk 900 ft. Gave reminders for ed as pt has short term memory issues.  [id number]  Harriet Massoneeve, Mariana Wiederholt Kristan CES, ACSM 08/11/2015 9:16 AM     88 SR

## 2015-08-11 NOTE — Discharge Instructions (Addendum)
Acute Coronary Syndrome °Acute coronary syndrome (ACS) is a serious problem in which there is suddenly not enough blood and oxygen supplied to the heart. ACS may mean that one or more of the blood vessels in your heart (coronary arteries) may be blocked. ACS can result in chest pain or a heart attack (myocardial infarction or MI). °CAUSES °This condition is caused by atherosclerosis, which is the buildup of fat and cholesterol (plaque) on the inside of the arteries. Over time, the plaque may narrow or block the artery, and this will lessen blood flow to the heart. Plaque can also become weak and break off within a coronary artery to form a clot and cause a sudden blockage. °RISK FACTORS °The risks factors of this condition include: °· High cholesterol levels. °· High blood pressure (hypertension). °· Smoking. °· Diabetes. °· Age. °· Family history of chest pain, heart disease, or stroke. °· Lack of exercise. °SYMPTOMS °The most common signs of this condition include: °· Chest pain, which can be: °· A crushing or squeezing in the chest. °· A tightness, pressure, fullness, or heaviness in the chest. °· Present for more than a few minutes, or it can stop and recur. °· Pain in the arms, neck, jaw, or back. °· Unexplained heartburn or indigestion. °· Shortness of breath. °· Nausea. °· Sudden cold sweats. °· Feeling light-headed or dizzy. °Sometimes, this condition has no symptoms. °DIAGNOSIS °ACS may be diagnosed through the following tests: °· Electrocardiogram (ECG). °· Blood tests. °· Coronary angiogram. This is a procedure to look at the coronary arteries to see if there is any blockage. °TREATMENT °Treatment for ACS may include: °· Healthy behavioral changes to reduce or control risk factors. °· Medicine. °· Coronary stenting. A stent helps to keep an artery open. °· Coronary angioplasty. This procedure widens a narrowed or blocked artery. °· Coronary artery bypass surgery. This will allow your blood to pass the  blockage (bypass) to reach your heart. °HOME CARE INSTRUCTIONS °Eating and Drinking °· Follow a heart-healthy diet. A dietitian can you help to educate you about healthy food options and changes. °· Use healthy cooking methods such as roasting, grilling, broiling, baking, poaching, steaming, or stir-frying. Talk to a dietitian to learn more about healthy cooking methods. °Medicines °· Take medicines only as directed by your health care provider. °· Do not take the following medicines unless your health care provider approves: °¨ Nonsteroidal anti-inflammatory drugs (NSAIDs), such as ibuprofen, naproxen, or celecoxib. °¨ Vitamin supplements that contain vitamin A, vitamin E, or both. °¨ Hormone replacement therapy that contains estrogen with or without progestin. °· Stop illegal drug use. °Activities °· Follow an exercise program that is approved by your health care provider. °· Plan rest periods when you are fatigued. °Lifestyle °· Do not use any tobacco products, including cigarettes, chewing tobacco, or electronic cigarettes. If you need help quitting, ask your health care provider. °· If you drink alcohol, and your health care provider approves, limit your alcohol intake to no more than 1 drink per day. One drink equals 12 ounces of beer, 5 ounces of wine, or 1½ ounces of hard liquor. °· Learn to manage stress. °· Maintain a healthy weight. Lose weight as approved by your health care provider. °General Instructions °· Manage other health conditions, such as hypertension and diabetes, as directed by your health care provider. °· Keep all follow-up visits as directed by your health care provider. This is important. °· Your health care provider may ask you to monitor your blood   pressure. A blood pressure reading consists of a higher number over a lower number, such as 110 over 72, written as 110/72. Ideally, your blood pressure should be: °¨ Below 140/90 if you have no other medical conditions. °¨ Below 130/80 if  you have diabetes or kidney disease. °SEEK IMMEDIATE MEDICAL CARE IF: °· You have pain in your chest, neck, arm, jaw, stomach, or back that lasts more than a few minutes, is recurring, or is not relieved by taking medicine under your tongue (sublingual nitroglycerin). °· You have profuse sweating without cause. °· You have unexplained: °¨ Heartburn or indigestion. °¨ Shortness of breath or difficulty breathing. °¨ Nausea or vomiting. °¨ Fatigue. °¨ Feelings of nervousness or anxiety. °¨ Weakness. °¨ Diarrhea. °· You have sudden light-headedness or dizziness. °· You faint. °These symptoms may represent a serious problem that is an emergency. Do not wait to see if the symptoms will go away. Get medical help right away. Call your local emergency services (911 in the [place] Do not drive yourself to the clinic or hospital. °  °This information is not intended to replace advice given to you by your health care provider. Make sure you discuss any questions you have with your health care provider. °  °Document Released: 07/15/2005 Document Revised: 08/05/2014 Document Reviewed: 11/16/2013 °Elsevier Interactive Patient Education ©2016 [employer] °Coronary Angiogram With Stent °Coronary angiography with stent placement is a procedure to widen or open a narrow blood vessel of the heart (coronary artery). When a coronary artery becomes partially blocked, it decreases blood flow to that area. This may lead to chest pain or a heart attack (myocardial infarction). Arteries may become blocked by cholesterol buildup (plaque) in the lining or wall.  °A stent is a small piece of metal that looks like a mesh or a spring. Stent placement may be done right after a coronary angiography in which a blocked artery is found or as a treatment for a heart attack.  °LET YOUR HEALTH CARE PROVIDER KNOW ABOUT: °· Any allergies you have.   °· All medicines you are taking, including vitamins, herbs, eye drops, creams, and over-the-counter  medicines.   °· Previous problems you or members of your family have had with the use of anesthetics.   °· Any blood disorders you have.   °· Previous surgeries you have had.   °· Medical conditions you have. °RISKS AND COMPLICATIONS °Generally, coronary angiography with stent is a safe procedure. However, problems can occur and include: °· Damage to the heart or its blood vessels.   °· A return of blockage.   °· Bleeding, infection, or bruising at the insertion site.   °· A collection of blood under the skin (hematoma) at the insertion site. °· Blood clot in another part of the body.   °· Kidney injury.   °· Allergic reaction to the dye or contrast used.   °· Bleeding into the abdomen (retroperitoneal bleeding). °BEFORE THE PROCEDURE °· Do not eat or drink anything after midnight on the night before the procedure or as directed by your health care provider.  °· Ask your health care provider about changing or stopping your regular medicines. This is especially important if you are taking diabetes medicines or blood thinners. °· Your health care provider will make sure you understand the procedure as well as the risks and potential problems associated with the procedure.   °PROCEDURE °· You may be given a medicine to help you relax before and during the procedure (sedative). This medicine will be given through an IV tube that is put into one   of your veins.   The area where the catheter will be inserted will be shaved and cleaned. This is usually done in the groin but may be done in the fold of your arm (near your elbow) or in the wrist.   A medicine will be given to numb the area where the catheter will be inserted (local anesthetic).   The catheter will be inserted into an artery using a guide wire. A type of X-ray (fluoroscopy) will be used to help guide the catheter to the opening of the blocked artery.   A dye will then be injected into the catheter, and X-rays will be taken. The dye will help to  show where any narrowing or blockages are located in the heart arteries.   A tiny wire will be guided to the blocked spot, and a balloon will be inflated to make the artery wider. The stent will be expanded and will crush the plaque into the wall of the vessel. The stent will hold the area open like a scaffolding and improve the blood flow.   Sometimes the artery may be made wider using a laser or other tools to remove plaque.   When the blood flow is better, the catheter will be removed. The lining of the artery will grow over the stent, which stays where it was placed.  AFTER THE PROCEDURE  If the procedure is done through the leg, you will be kept in bed lying flat for about 6 hours. You will be instructed to not bend or cross your legs.   The insertion site will be checked frequently.   The pulse in your feet or wrist will be checked frequently.   Additional blood tests, X-rays, and electrocardiography may be done.   This information is not intended to replace advice given to you by your health care provider. Make sure you discuss any questions you have with your health care provider.   Document Released: 01/19/2003 Document Revised: 08/05/2014 Document Reviewed: 12/07/2012 Elsevier Interactive Patient Education 2016 Elsevier Inc.  STOP TAKING EXCEDRIN MIGRAINE.

## 2015-08-12 NOTE — Discharge Summary (Signed)
NAME:  Tiffany Mack, Tiffany Mack        ACCOUNT NO.:  [id number]  MEDICAL RECORD NO.:  [id number]  LOCATION:  6C08C                        FACILITY:  [facility]  PHYSICIAN:  Brie Eppard N. Sharyn LullHarwani, M.D. DATE OF BIRTH:  11/17/1940  DATE OF ADMISSION:  08/04/2015 DATE OF DISCHARGE:  08/11/2015                              DISCHARGE SUMMARY   ADMITTING DIAGNOSES: 1. Acute non-Q-wave myocardial infarction. 2. History of migraine headache. 3. Depression. 4. Anxiety disorder. 5. Positive family history of coronary artery disease.  DISCHARGE DIAGNOSES: 1. Status post acute non-Q-wave myocardial infarction status post     percutaneous coronary intervention to patent ductus arteriosus and     posterolateral ventricular branch and right coronary artery     requiring 3 stents with excellent angiographic results. 2. Multivessel coronary artery disease, status post percutaneous     transluminal coronary angioplasty stenting to left anterior     descending coronary artery, percutaneous transluminal coronary     angioplasty to diagonal and percutaneous transluminal coronary     angioplasty stenting to left circumflex system with excellent     angiographic results. 3. Hypertension. 4. Prediabetic. 5. Depression. 6. Anxiety disorder. 7. History of migraine headaches. 8. Positive family history of coronary artery disease. 9. Acute-on-chronic anemia secondary to blood loss during the     procedure, small hematoma. 10.Status post very small pseudoaneurysm status post compression of     pseudoaneurysm by ultrasound guidance.  DISCHARGE HOME MEDICATIONS: 1. Aspirin 81 mg 1 tablet daily. 2. Atorvastatin 80 mg 1 tablet daily. 3. Dulcolax 5 mg daily as needed for constipation. 4. Ferrous sulfate 325 mg 3 times daily. 5. Metoprolol tartrate 25 mg half-tablet twice daily. 6. Nitrostat sublingual p.r.n. 7. Protonix 40 mg daily. 8. Brilinta 90 mg twice daily. 9. Nitrostat sublingual p.r.n.  ACTIVITY:   Increased activity slowly as tolerated.  The patient will be scheduled for phase 2 cardiac rehab as outpatient.  DIET:  Low-salt, low-cholesterol 1800 calories ADA diet.  CONDITION ON DISCHARGE:  Stable.  FOLLOWUP:  With me in 1 week.  BRIEF HISTORY AND HOSPITAL COURSE:  [name] is a 75 year old female with past medical history significant for depression, anxiety disorder, migraine headache, positive family history of coronary artery disease.  She came to the ER complaining of recurrent retrosternal chest pain described as tightness, graded 7 or 10, initially took Pepto-Bismol and drank coke with relief of chest pain.  Today, the pain got worse, associated with nausea, vomiting, and mild shortness of breath, so decided to go to ED.  EKG done in the ED showed normal sinus rhythm with minor ST-T wave changes in inferior leads and was noted to have elevated troponin-I of 3.61.  Patient received aspirin, IV heparin, nitro with partial relief of chest pain from 7/10 to 2/10.  PHYSICAL EXAMINATION:  GENERAL:  She was alert, awake, oriented x3. VITAL SIGNS:  Blood pressure was 115/56, pulse 72 regular.  She was afebrile. HEENT:  Conjunctivae were pink. NECK:  Supple.  No JVD.  No bruit. LUNGS:  Clear to auscultation without rhonchi or rales. CARDIOVASCULAR:  S1, S2 was normal.  There was soft systolic murmur and S4 gallop. ABDOMEN:  Soft.  Bowel sounds were present, nontender. EXTREMITIES:  There  is no clubbing, cyanosis, or edema.  LABORATORY DATA:  Her labs, sodium was 138, potassium 3.1. Troponin-I first set was 3.61, 2.94, 3.61.  Hemoglobin was 11.5, hematocrit 35.7, white count of 8.6.  Repeat electrolytes, sodium was 143, potassium 4.5, BUN less than 5, creatinine 0.6, glucose was 112.  Troponin-I post PCI was 65, then is trending down 55.26, 30.90, 19.50, 13.63, yesterday was 3.26.  Labs this morning, hemoglobin is 9.1, hematocrit 27.6, white count of 7.5, yesterday  hemoglobin was 7.3 requiring 1 unit of packed RBC.  Post transfusion, her hemoglobin today is 9.1, hematocrit 27.6. Last electrolytes; sodium 139, potassium 3.5, BUN 8, creatinine 0.58, glucose 113.  Cholesterol was 135, triglycerides 86, HDL was low 22, LDL 96.  BRIEF HOSPITAL COURSE:  The patient was directly taken to the cath lab and underwent left cardiac cath with selective left and right coronary angiography and PTCA stenting to RCA as per procedure report.  The patient tolerated procedure well.  The patient was noted to have multiple lesions in left circumflex, obtuse marginal, LAD, and ostial diagonal.  The patient subsequently underwent staged PCI to LAD diagonal and left circumflex and obtuse marginal.  As per procedure report, the patient tolerated the procedure well.  There were no complications. Post procedure, while in the bathroom, patient felt popping sound in the right groin and was noted to have small hematoma with pseudoaneurysm, which was successfully compressed.  Patient did drop her hemoglobin to 7.3 requiring 1 unit of packed RBC.  Repeat ultrasound of the femoral artery showed no evidence of pseudoaneurysm with very small hematoma which is resolving.  The patient is ambulating in room and hallway without any problems.  Phase 1 cardiac rehab was called.  The patient will be scheduled for phase 2 cardiac rehab as outpatient.  The patient was discussed at length regarding lifestyle changes, diet, compliance with medication, and followup.     Eduardo Osier. Sharyn Lull, M.D.     [facility]  D:  08/11/2015  T:  08/12/2015  Job:  [id number]

## 2015-08-13 DIAGNOSIS — I10 Essential (primary) hypertension: Secondary | ICD-10-CM | POA: Diagnosis not present

## 2015-08-13 DIAGNOSIS — S301XXD Contusion of abdominal wall, subsequent encounter: Secondary | ICD-10-CM | POA: Diagnosis not present

## 2015-08-13 DIAGNOSIS — I2510 Atherosclerotic heart disease of native coronary artery without angina pectoris: Secondary | ICD-10-CM | POA: Diagnosis not present

## 2015-08-13 DIAGNOSIS — Z9861 Coronary angioplasty status: Secondary | ICD-10-CM | POA: Diagnosis not present

## 2015-08-13 DIAGNOSIS — Z955 Presence of coronary angioplasty implant and graft: Secondary | ICD-10-CM | POA: Diagnosis not present

## 2015-08-13 DIAGNOSIS — I214 Non-ST elevation (NSTEMI) myocardial infarction: Secondary | ICD-10-CM | POA: Diagnosis not present

## 2015-08-13 DIAGNOSIS — R69 Illness, unspecified: Secondary | ICD-10-CM | POA: Diagnosis not present

## 2015-08-18 DIAGNOSIS — E785 Hyperlipidemia, unspecified: Secondary | ICD-10-CM | POA: Diagnosis not present

## 2015-08-18 DIAGNOSIS — R69 Illness, unspecified: Secondary | ICD-10-CM | POA: Diagnosis not present

## 2015-08-18 DIAGNOSIS — I10 Essential (primary) hypertension: Secondary | ICD-10-CM | POA: Diagnosis not present

## 2015-08-18 DIAGNOSIS — I214 Non-ST elevation (NSTEMI) myocardial infarction: Secondary | ICD-10-CM | POA: Diagnosis not present

## 2015-08-18 DIAGNOSIS — I2510 Atherosclerotic heart disease of native coronary artery without angina pectoris: Secondary | ICD-10-CM | POA: Diagnosis not present

## 2015-08-18 DIAGNOSIS — Z955 Presence of coronary angioplasty implant and graft: Secondary | ICD-10-CM | POA: Diagnosis not present

## 2015-08-18 DIAGNOSIS — S301XXD Contusion of abdominal wall, subsequent encounter: Secondary | ICD-10-CM | POA: Diagnosis not present

## 2015-08-18 DIAGNOSIS — K219 Gastro-esophageal reflux disease without esophagitis: Secondary | ICD-10-CM | POA: Diagnosis not present

## 2015-08-23 DIAGNOSIS — Z955 Presence of coronary angioplasty implant and graft: Secondary | ICD-10-CM | POA: Diagnosis not present

## 2015-08-23 DIAGNOSIS — I214 Non-ST elevation (NSTEMI) myocardial infarction: Secondary | ICD-10-CM | POA: Diagnosis not present

## 2015-08-23 DIAGNOSIS — R69 Illness, unspecified: Secondary | ICD-10-CM | POA: Diagnosis not present

## 2015-08-23 DIAGNOSIS — S301XXD Contusion of abdominal wall, subsequent encounter: Secondary | ICD-10-CM | POA: Diagnosis not present

## 2015-08-24 DIAGNOSIS — I10 Essential (primary) hypertension: Secondary | ICD-10-CM | POA: Diagnosis not present

## 2015-08-24 DIAGNOSIS — I214 Non-ST elevation (NSTEMI) myocardial infarction: Secondary | ICD-10-CM | POA: Diagnosis not present

## 2015-08-24 DIAGNOSIS — E785 Hyperlipidemia, unspecified: Secondary | ICD-10-CM | POA: Diagnosis not present

## 2015-08-24 DIAGNOSIS — D649 Anemia, unspecified: Secondary | ICD-10-CM | POA: Diagnosis not present

## 2015-08-24 DIAGNOSIS — I2510 Atherosclerotic heart disease of native coronary artery without angina pectoris: Secondary | ICD-10-CM | POA: Diagnosis not present

## 2015-08-28 DIAGNOSIS — Z955 Presence of coronary angioplasty implant and graft: Secondary | ICD-10-CM | POA: Diagnosis not present

## 2015-08-28 DIAGNOSIS — R69 Illness, unspecified: Secondary | ICD-10-CM | POA: Diagnosis not present

## 2015-08-28 DIAGNOSIS — I214 Non-ST elevation (NSTEMI) myocardial infarction: Secondary | ICD-10-CM | POA: Diagnosis not present

## 2015-08-28 DIAGNOSIS — S301XXD Contusion of abdominal wall, subsequent encounter: Secondary | ICD-10-CM | POA: Diagnosis not present

## 2015-08-29 ENCOUNTER — Encounter (HOSPITAL_COMMUNITY)
Admission: RE | Admit: 2015-08-29 | Discharge: 2015-08-29 | Disposition: A | Discharge status: Home / Self Care | Payer: Medicare HMO | Source: Ambulatory Visit | Attending: Cardiology | Admitting: Cardiology

## 2015-08-29 ENCOUNTER — Encounter (HOSPITAL_COMMUNITY): Payer: Self-pay

## 2015-08-29 VITALS — BP 110/58 | HR 75 | Ht 67.0 in | Wt 141.2 lb

## 2015-08-29 DIAGNOSIS — I214 Non-ST elevation (NSTEMI) myocardial infarction: Secondary | ICD-10-CM | POA: Insufficient documentation

## 2015-08-29 DIAGNOSIS — I2510 Atherosclerotic heart disease of native coronary artery without angina pectoris: Secondary | ICD-10-CM | POA: Insufficient documentation

## 2015-08-29 DIAGNOSIS — Z955 Presence of coronary angioplasty implant and graft: Secondary | ICD-10-CM | POA: Diagnosis not present

## 2015-08-29 NOTE — Progress Notes (Signed)
Cardiac/Pulmonary Rehab Medication Review by a Pharmacist  Does the patient  feel that his/her medications are working for him/her?  yes  Has the patient been experiencing any side effects to the medications prescribed?  no  Does the patient measure his/her own blood pressure or blood glucose at home?  no   Does the patient have any problems obtaining medications due to transportation or finances?   no  Understanding of regimen: good Understanding of indications: good Potential of compliance: good  Questions asked to Determine Patient Understanding of Medication Regimen:  1. What is the name of the medication?  2. What is the medication used for?  3. When should it be taken?  4. How much should be taken?  5. How will you take it?  6. What side effects should you report?  Understanding Defined as: Excellent: All questions above are correct Good: Questions 1-4 are correct Fair: Questions 1-2 are correct  Poor: 1 or none of the above questions are correct   Pharmacist comments: Pt is not reporting any side effects from medications.  Pt does not check BP at home currently but states she may start, will d/w cardiologist.  Explained indications for current medications.  Valrie Hart A 08/29/2015 8:50 AM

## 2015-08-29 NOTE — Patient Instructions (Signed)
Pt has finished orientation and is scheduled to return to CR on 09/04/15 at 1545. Pt has been instructed to arrive to class 15 minutes early for scheduled class. Pt has been instructed to wear comfortable clothing and shoes with rubber soles. Pt has been told to take their medications 1 hour prior to coming to class.  If the patient is not going to attend class, she has been instructed to call.

## 2015-08-29 NOTE — Progress Notes (Signed)
Patient arrived for 1st visit/orientation/education at 27. Patient was referred to CR by Dr. Sharyn Lull due to MI (l21.4) and stent (z95.5). During orientation advised patient on arrival and appointment times what to wear, what to do before, during and after exercise. Reviewed attendance and class policy. Talked about inclement weather and class consultation policy. Pt is scheduled to return Cardiac Rehab on 09/04/15 at 1545. Pt was advised to come to class 5 minutes before class starts. She was also given instructions on meeting with the dietician and attending the Family Structure classes. Pt is eager to get started. Patients entrance PHQ 9 score is 11.  Patient stated she does not want counseling and is getting much better due to recent divorce.  Patient was able to complete 6 minute walk test. Patient was measured for the equipment. Discussed equipment safety with patient. Took patient pre-anthropometric measurements. Patient finished visit at 0910.

## 2015-09-04 ENCOUNTER — Encounter (HOSPITAL_COMMUNITY)
Admission: RE | Admit: 2015-09-04 | Discharge: 2015-09-04 | Disposition: A | Discharge status: Home / Self Care | Payer: Medicare HMO | Source: Ambulatory Visit | Attending: Cardiology | Admitting: Cardiology

## 2015-09-04 DIAGNOSIS — I214 Non-ST elevation (NSTEMI) myocardial infarction: Secondary | ICD-10-CM | POA: Insufficient documentation

## 2015-09-04 DIAGNOSIS — I2510 Atherosclerotic heart disease of native coronary artery without angina pectoris: Secondary | ICD-10-CM | POA: Insufficient documentation

## 2015-09-04 DIAGNOSIS — Z955 Presence of coronary angioplasty implant and graft: Secondary | ICD-10-CM | POA: Insufficient documentation

## 2015-09-06 ENCOUNTER — Encounter (HOSPITAL_COMMUNITY)
Admission: RE | Admit: 2015-09-06 | Discharge: 2015-09-06 | Disposition: A | Discharge status: Home / Self Care | Payer: Medicare HMO | Source: Ambulatory Visit | Attending: Cardiology | Admitting: Cardiology

## 2015-09-06 DIAGNOSIS — I214 Non-ST elevation (NSTEMI) myocardial infarction: Secondary | ICD-10-CM | POA: Diagnosis not present

## 2015-09-06 DIAGNOSIS — Z955 Presence of coronary angioplasty implant and graft: Secondary | ICD-10-CM | POA: Diagnosis not present

## 2015-09-06 DIAGNOSIS — I2510 Atherosclerotic heart disease of native coronary artery without angina pectoris: Secondary | ICD-10-CM | POA: Diagnosis not present

## 2015-09-08 ENCOUNTER — Encounter (HOSPITAL_COMMUNITY)
Admission: RE | Admit: 2015-09-08 | Discharge: 2015-09-08 | Disposition: A | Discharge status: Home / Self Care | Payer: Medicare HMO | Source: Ambulatory Visit | Attending: Cardiology | Admitting: Cardiology

## 2015-09-08 DIAGNOSIS — I2510 Atherosclerotic heart disease of native coronary artery without angina pectoris: Secondary | ICD-10-CM | POA: Diagnosis not present

## 2015-09-08 DIAGNOSIS — I214 Non-ST elevation (NSTEMI) myocardial infarction: Secondary | ICD-10-CM | POA: Diagnosis not present

## 2015-09-08 DIAGNOSIS — Z955 Presence of coronary angioplasty implant and graft: Secondary | ICD-10-CM | POA: Diagnosis not present

## 2015-09-11 ENCOUNTER — Encounter (HOSPITAL_COMMUNITY)
Admission: RE | Admit: 2015-09-11 | Discharge: 2015-09-11 | Disposition: A | Discharge status: Home / Self Care | Payer: Medicare HMO | Source: Ambulatory Visit | Attending: Cardiology | Admitting: Cardiology

## 2015-09-11 DIAGNOSIS — I214 Non-ST elevation (NSTEMI) myocardial infarction: Secondary | ICD-10-CM | POA: Diagnosis not present

## 2015-09-11 DIAGNOSIS — I2510 Atherosclerotic heart disease of native coronary artery without angina pectoris: Secondary | ICD-10-CM | POA: Diagnosis not present

## 2015-09-11 DIAGNOSIS — Z955 Presence of coronary angioplasty implant and graft: Secondary | ICD-10-CM | POA: Diagnosis not present

## 2015-09-13 ENCOUNTER — Encounter (HOSPITAL_COMMUNITY)
Admission: RE | Admit: 2015-09-13 | Discharge: 2015-09-13 | Disposition: A | Discharge status: Home / Self Care | Payer: Medicare HMO | Source: Ambulatory Visit | Attending: Cardiology | Admitting: Cardiology

## 2015-09-13 DIAGNOSIS — I214 Non-ST elevation (NSTEMI) myocardial infarction: Secondary | ICD-10-CM | POA: Diagnosis not present

## 2015-09-13 DIAGNOSIS — Z955 Presence of coronary angioplasty implant and graft: Secondary | ICD-10-CM | POA: Diagnosis not present

## 2015-09-13 DIAGNOSIS — I2510 Atherosclerotic heart disease of native coronary artery without angina pectoris: Secondary | ICD-10-CM | POA: Diagnosis not present

## 2015-09-15 ENCOUNTER — Encounter (HOSPITAL_COMMUNITY)
Admission: RE | Admit: 2015-09-15 | Discharge: 2015-09-15 | Disposition: A | Discharge status: Home / Self Care | Payer: Medicare HMO | Source: Ambulatory Visit | Attending: Cardiology | Admitting: Cardiology

## 2015-09-15 DIAGNOSIS — Z955 Presence of coronary angioplasty implant and graft: Secondary | ICD-10-CM | POA: Diagnosis not present

## 2015-09-15 DIAGNOSIS — I2510 Atherosclerotic heart disease of native coronary artery without angina pectoris: Secondary | ICD-10-CM | POA: Diagnosis not present

## 2015-09-15 DIAGNOSIS — I214 Non-ST elevation (NSTEMI) myocardial infarction: Secondary | ICD-10-CM | POA: Diagnosis not present

## 2015-09-18 ENCOUNTER — Encounter (HOSPITAL_COMMUNITY)
Admission: RE | Admit: 2015-09-18 | Discharge: 2015-09-18 | Disposition: A | Discharge status: Home / Self Care | Payer: Medicare HMO | Source: Ambulatory Visit | Attending: Cardiology | Admitting: Cardiology

## 2015-09-18 DIAGNOSIS — I214 Non-ST elevation (NSTEMI) myocardial infarction: Secondary | ICD-10-CM | POA: Diagnosis not present

## 2015-09-18 DIAGNOSIS — Z955 Presence of coronary angioplasty implant and graft: Secondary | ICD-10-CM | POA: Diagnosis not present

## 2015-09-18 DIAGNOSIS — I2510 Atherosclerotic heart disease of native coronary artery without angina pectoris: Secondary | ICD-10-CM | POA: Diagnosis not present

## 2015-09-20 ENCOUNTER — Encounter (HOSPITAL_COMMUNITY)
Admission: RE | Admit: 2015-09-20 | Discharge: 2015-09-20 | Disposition: A | Discharge status: Home / Self Care | Payer: Medicare HMO | Source: Ambulatory Visit | Attending: Cardiology | Admitting: Cardiology

## 2015-09-20 DIAGNOSIS — I2510 Atherosclerotic heart disease of native coronary artery without angina pectoris: Secondary | ICD-10-CM | POA: Diagnosis not present

## 2015-09-20 DIAGNOSIS — I214 Non-ST elevation (NSTEMI) myocardial infarction: Secondary | ICD-10-CM | POA: Diagnosis not present

## 2015-09-20 DIAGNOSIS — Z955 Presence of coronary angioplasty implant and graft: Secondary | ICD-10-CM | POA: Diagnosis not present

## 2015-09-20 NOTE — Progress Notes (Signed)
Cardiac Rehabilitation Program Outcomes Report   Orientation:  08/29/15 Graduate Date:  tbd Discharge Date:  tbd # of sessions completed: 3  Cardiologist: Sharyn Lull Family MD:  Ezzard Flax Time:  1545  A.  Exercise Program:  Tolerates exercise @ 3.39 METS for 15 minutes and Walk Test Results:  Pre: 2.30 mets  B.  Mental Health:  Good mental attitude and PHQ-9: 11.  patient stated she did not want counseling and states it is getting much better since recent divorce.   C.  Education/Instruction/Skills  Accurately checks own pulse.  Rest:  73  Exercise:  98 and Knows THR for exercise  Uses Perceived Exertion Scale and/or Dyspnea Scale  D.  Nutrition/Weight Control/Body Composition:  Adherence to prescribed nutrition program: fair    E.  Blood Lipids    Lab Results  Component Value Date   CHOL 135 08/05/2015   HDL 22* 08/05/2015   LDLCALC 96 08/05/2015   TRIG 86 08/05/2015   CHOLHDL 6.1 08/05/2015    F.  Lifestyle Changes:  Making positive lifestyle changes and Not smoking:  Quit NEVER smoker  G.  Symptoms noted with exercise:  Asymptomatic  Report Completed By:  Doretha Sou RN   Comments:  This is the patients first week progress note for [facility].

## 2015-09-22 ENCOUNTER — Encounter (HOSPITAL_COMMUNITY)
Admission: RE | Admit: 2015-09-22 | Discharge: 2015-09-22 | Disposition: A | Discharge status: Home / Self Care | Payer: Medicare HMO | Source: Ambulatory Visit | Attending: Cardiology | Admitting: Cardiology

## 2015-09-22 DIAGNOSIS — I2510 Atherosclerotic heart disease of native coronary artery without angina pectoris: Secondary | ICD-10-CM | POA: Diagnosis not present

## 2015-09-22 DIAGNOSIS — I214 Non-ST elevation (NSTEMI) myocardial infarction: Secondary | ICD-10-CM | POA: Diagnosis not present

## 2015-09-22 DIAGNOSIS — Z955 Presence of coronary angioplasty implant and graft: Secondary | ICD-10-CM | POA: Diagnosis not present

## 2015-09-22 NOTE — Progress Notes (Signed)
Patient was given individual home exercise plan. Handout was reviewed and discussed with patient. Patient signed plan and verbalized an understanding.  

## 2015-09-25 ENCOUNTER — Encounter (HOSPITAL_COMMUNITY)
Admission: RE | Admit: 2015-09-25 | Discharge: 2015-09-25 | Disposition: A | Discharge status: Home / Self Care | Payer: Medicare HMO | Source: Ambulatory Visit | Attending: Cardiology | Admitting: Cardiology

## 2015-09-25 DIAGNOSIS — I214 Non-ST elevation (NSTEMI) myocardial infarction: Secondary | ICD-10-CM | POA: Diagnosis not present

## 2015-09-25 DIAGNOSIS — I2510 Atherosclerotic heart disease of native coronary artery without angina pectoris: Secondary | ICD-10-CM | POA: Diagnosis not present

## 2015-09-25 DIAGNOSIS — Z955 Presence of coronary angioplasty implant and graft: Secondary | ICD-10-CM | POA: Diagnosis not present

## 2015-09-27 ENCOUNTER — Encounter (HOSPITAL_COMMUNITY): Payer: Medicare HMO

## 2015-09-29 ENCOUNTER — Encounter (HOSPITAL_COMMUNITY)
Admission: RE | Admit: 2015-09-29 | Discharge: 2015-09-29 | Disposition: A | Discharge status: Home / Self Care | Payer: Medicare HMO | Source: Ambulatory Visit | Attending: Cardiology | Admitting: Cardiology

## 2015-09-29 DIAGNOSIS — I2510 Atherosclerotic heart disease of native coronary artery without angina pectoris: Secondary | ICD-10-CM | POA: Diagnosis not present

## 2015-09-29 DIAGNOSIS — Z955 Presence of coronary angioplasty implant and graft: Secondary | ICD-10-CM | POA: Insufficient documentation

## 2015-09-29 DIAGNOSIS — I214 Non-ST elevation (NSTEMI) myocardial infarction: Secondary | ICD-10-CM | POA: Insufficient documentation

## 2015-10-02 ENCOUNTER — Encounter (HOSPITAL_COMMUNITY)
Admission: RE | Admit: 2015-10-02 | Discharge: 2015-10-02 | Disposition: A | Discharge status: Home / Self Care | Payer: Medicare HMO | Source: Ambulatory Visit | Attending: Cardiology | Admitting: Cardiology

## 2015-10-02 DIAGNOSIS — Z955 Presence of coronary angioplasty implant and graft: Secondary | ICD-10-CM | POA: Diagnosis not present

## 2015-10-02 DIAGNOSIS — I2510 Atherosclerotic heart disease of native coronary artery without angina pectoris: Secondary | ICD-10-CM | POA: Diagnosis not present

## 2015-10-02 DIAGNOSIS — I214 Non-ST elevation (NSTEMI) myocardial infarction: Secondary | ICD-10-CM | POA: Diagnosis not present

## 2015-10-04 ENCOUNTER — Encounter (HOSPITAL_COMMUNITY)
Admission: RE | Admit: 2015-10-04 | Discharge: 2015-10-04 | Disposition: A | Discharge status: Home / Self Care | Payer: Medicare HMO | Source: Ambulatory Visit | Attending: Cardiology | Admitting: Cardiology

## 2015-10-04 DIAGNOSIS — Z955 Presence of coronary angioplasty implant and graft: Secondary | ICD-10-CM | POA: Diagnosis not present

## 2015-10-04 DIAGNOSIS — I2510 Atherosclerotic heart disease of native coronary artery without angina pectoris: Secondary | ICD-10-CM | POA: Diagnosis not present

## 2015-10-04 DIAGNOSIS — I214 Non-ST elevation (NSTEMI) myocardial infarction: Secondary | ICD-10-CM | POA: Diagnosis not present

## 2015-10-06 ENCOUNTER — Encounter (HOSPITAL_COMMUNITY)
Admission: RE | Admit: 2015-10-06 | Discharge: 2015-10-06 | Disposition: A | Discharge status: Home / Self Care | Payer: Medicare HMO | Source: Ambulatory Visit | Attending: Cardiology | Admitting: Cardiology

## 2015-10-06 DIAGNOSIS — I214 Non-ST elevation (NSTEMI) myocardial infarction: Secondary | ICD-10-CM | POA: Diagnosis not present

## 2015-10-06 DIAGNOSIS — I2510 Atherosclerotic heart disease of native coronary artery without angina pectoris: Secondary | ICD-10-CM | POA: Diagnosis not present

## 2015-10-06 DIAGNOSIS — Z955 Presence of coronary angioplasty implant and graft: Secondary | ICD-10-CM | POA: Diagnosis not present

## 2015-10-09 ENCOUNTER — Encounter (HOSPITAL_COMMUNITY)
Admission: RE | Admit: 2015-10-09 | Discharge: 2015-10-09 | Disposition: A | Discharge status: Home / Self Care | Payer: Medicare HMO | Source: Ambulatory Visit | Attending: Cardiology | Admitting: Cardiology

## 2015-10-09 DIAGNOSIS — Z955 Presence of coronary angioplasty implant and graft: Secondary | ICD-10-CM | POA: Diagnosis not present

## 2015-10-09 DIAGNOSIS — I2510 Atherosclerotic heart disease of native coronary artery without angina pectoris: Secondary | ICD-10-CM | POA: Diagnosis not present

## 2015-10-09 DIAGNOSIS — I214 Non-ST elevation (NSTEMI) myocardial infarction: Secondary | ICD-10-CM | POA: Diagnosis not present

## 2015-10-11 ENCOUNTER — Encounter (HOSPITAL_COMMUNITY)
Admission: RE | Admit: 2015-10-11 | Discharge: 2015-10-11 | Disposition: A | Discharge status: Home / Self Care | Payer: Medicare HMO | Source: Ambulatory Visit | Attending: Cardiology | Admitting: Cardiology

## 2015-10-11 DIAGNOSIS — I214 Non-ST elevation (NSTEMI) myocardial infarction: Secondary | ICD-10-CM | POA: Diagnosis not present

## 2015-10-11 DIAGNOSIS — Z955 Presence of coronary angioplasty implant and graft: Secondary | ICD-10-CM | POA: Diagnosis not present

## 2015-10-11 DIAGNOSIS — I2510 Atherosclerotic heart disease of native coronary artery without angina pectoris: Secondary | ICD-10-CM | POA: Diagnosis not present

## 2015-10-13 ENCOUNTER — Encounter (HOSPITAL_COMMUNITY)
Admission: RE | Admit: 2015-10-13 | Discharge: 2015-10-13 | Disposition: A | Discharge status: Home / Self Care | Payer: Medicare HMO | Source: Ambulatory Visit | Attending: Cardiology | Admitting: Cardiology

## 2015-10-13 DIAGNOSIS — Z955 Presence of coronary angioplasty implant and graft: Secondary | ICD-10-CM | POA: Diagnosis not present

## 2015-10-13 DIAGNOSIS — I214 Non-ST elevation (NSTEMI) myocardial infarction: Secondary | ICD-10-CM | POA: Diagnosis not present

## 2015-10-13 DIAGNOSIS — I2510 Atherosclerotic heart disease of native coronary artery without angina pectoris: Secondary | ICD-10-CM | POA: Diagnosis not present

## 2015-10-16 ENCOUNTER — Encounter (HOSPITAL_COMMUNITY)
Admission: RE | Admit: 2015-10-16 | Discharge: 2015-10-16 | Disposition: A | Discharge status: Home / Self Care | Payer: Medicare HMO | Source: Ambulatory Visit | Attending: Cardiology | Admitting: Cardiology

## 2015-10-16 DIAGNOSIS — I214 Non-ST elevation (NSTEMI) myocardial infarction: Secondary | ICD-10-CM | POA: Diagnosis not present

## 2015-10-16 DIAGNOSIS — I2510 Atherosclerotic heart disease of native coronary artery without angina pectoris: Secondary | ICD-10-CM | POA: Diagnosis not present

## 2015-10-16 DIAGNOSIS — Z955 Presence of coronary angioplasty implant and graft: Secondary | ICD-10-CM | POA: Diagnosis not present

## 2015-10-18 ENCOUNTER — Encounter (HOSPITAL_COMMUNITY)
Admission: RE | Admit: 2015-10-18 | Discharge: 2015-10-18 | Disposition: A | Discharge status: Home / Self Care | Payer: Medicare HMO | Source: Ambulatory Visit | Attending: Cardiology | Admitting: Cardiology

## 2015-10-18 DIAGNOSIS — I2510 Atherosclerotic heart disease of native coronary artery without angina pectoris: Secondary | ICD-10-CM | POA: Diagnosis not present

## 2015-10-18 DIAGNOSIS — I214 Non-ST elevation (NSTEMI) myocardial infarction: Secondary | ICD-10-CM | POA: Diagnosis not present

## 2015-10-18 DIAGNOSIS — Z955 Presence of coronary angioplasty implant and graft: Secondary | ICD-10-CM | POA: Diagnosis not present

## 2015-10-18 NOTE — Progress Notes (Signed)
Cardiac Rehabilitation Program Outcomes Report   Orientation:  08/29/15 Graduate Date:  tbd Discharge Date:  tbd # of sessions completed: 18  Cardiologist: Jaymes GraffHarwani Family MD:  Ezzard FlaxWolters Class Time:  0930  A.  Exercise Program:  Tolerates exercise @ 3.39 METS for 15 minutes  B.  Mental Health:  Good mental attitude  C.  Education/Instruction/Skills  Accurately checks own pulse.  Rest:  86  Exercise:  119 and Knows THR for exercise  Uses Perceived Exertion Scale and/or Dyspnea Scale  D.  Nutrition/Weight Control/Body Composition:  Adherence to prescribed nutrition program: fair    E.  Blood Lipids    Lab Results  Component Value Date   CHOL 135 08/05/2015   HDL 22* 08/05/2015   LDLCALC 96 08/05/2015   TRIG 86 08/05/2015   CHOLHDL 6.1 08/05/2015    F.  Lifestyle Changes:  Making positive lifestyle changes  G.  Symptoms noted with exercise:  Asymptomatic  Report Completed By:  Doretha Sou Analie Katzman RN   Comments:  This is the patients halfway progress note for [facility].

## 2015-10-20 ENCOUNTER — Encounter (HOSPITAL_COMMUNITY)
Admission: RE | Admit: 2015-10-20 | Discharge: 2015-10-20 | Disposition: A | Discharge status: Home / Self Care | Payer: Medicare HMO | Source: Ambulatory Visit | Attending: Cardiology | Admitting: Cardiology

## 2015-10-20 DIAGNOSIS — I214 Non-ST elevation (NSTEMI) myocardial infarction: Secondary | ICD-10-CM | POA: Diagnosis not present

## 2015-10-20 DIAGNOSIS — Z955 Presence of coronary angioplasty implant and graft: Secondary | ICD-10-CM | POA: Diagnosis not present

## 2015-10-20 DIAGNOSIS — I2510 Atherosclerotic heart disease of native coronary artery without angina pectoris: Secondary | ICD-10-CM | POA: Diagnosis not present

## 2015-10-23 ENCOUNTER — Encounter (HOSPITAL_COMMUNITY)
Admission: RE | Admit: 2015-10-23 | Discharge: 2015-10-23 | Disposition: A | Discharge status: Home / Self Care | Payer: Medicare HMO | Source: Ambulatory Visit | Attending: Cardiology | Admitting: Cardiology

## 2015-10-23 DIAGNOSIS — I214 Non-ST elevation (NSTEMI) myocardial infarction: Secondary | ICD-10-CM | POA: Diagnosis not present

## 2015-10-23 DIAGNOSIS — I2510 Atherosclerotic heart disease of native coronary artery without angina pectoris: Secondary | ICD-10-CM | POA: Diagnosis not present

## 2015-10-23 DIAGNOSIS — Z955 Presence of coronary angioplasty implant and graft: Secondary | ICD-10-CM | POA: Diagnosis not present

## 2015-10-25 ENCOUNTER — Encounter (HOSPITAL_COMMUNITY)
Admission: RE | Admit: 2015-10-25 | Discharge: 2015-10-25 | Disposition: A | Discharge status: Home / Self Care | Payer: Medicare HMO | Source: Ambulatory Visit | Attending: Cardiology | Admitting: Cardiology

## 2015-10-25 DIAGNOSIS — I2510 Atherosclerotic heart disease of native coronary artery without angina pectoris: Secondary | ICD-10-CM | POA: Diagnosis not present

## 2015-10-25 DIAGNOSIS — I214 Non-ST elevation (NSTEMI) myocardial infarction: Secondary | ICD-10-CM | POA: Diagnosis not present

## 2015-10-25 DIAGNOSIS — Z955 Presence of coronary angioplasty implant and graft: Secondary | ICD-10-CM | POA: Diagnosis not present

## 2015-10-27 ENCOUNTER — Encounter (HOSPITAL_COMMUNITY)
Admission: RE | Admit: 2015-10-27 | Discharge: 2015-10-27 | Disposition: A | Discharge status: Home / Self Care | Payer: Medicare HMO | Source: Ambulatory Visit | Attending: Cardiology | Admitting: Cardiology

## 2015-10-27 DIAGNOSIS — Z955 Presence of coronary angioplasty implant and graft: Secondary | ICD-10-CM | POA: Diagnosis not present

## 2015-10-27 DIAGNOSIS — I2510 Atherosclerotic heart disease of native coronary artery without angina pectoris: Secondary | ICD-10-CM | POA: Diagnosis not present

## 2015-10-27 DIAGNOSIS — I214 Non-ST elevation (NSTEMI) myocardial infarction: Secondary | ICD-10-CM | POA: Diagnosis not present

## 2015-10-30 ENCOUNTER — Encounter (HOSPITAL_COMMUNITY): Payer: Medicare HMO

## 2015-11-01 ENCOUNTER — Encounter (HOSPITAL_COMMUNITY): Payer: Medicare HMO

## 2015-11-01 NOTE — Progress Notes (Signed)
Patient stopping the [facility] program today due to high copay.  Patient is joining the maintenance program starting April.

## 2015-11-03 ENCOUNTER — Encounter (HOSPITAL_COMMUNITY): Payer: Medicare HMO

## 2015-11-06 ENCOUNTER — Encounter (HOSPITAL_COMMUNITY): Payer: Medicare HMO

## 2015-11-08 ENCOUNTER — Encounter (HOSPITAL_COMMUNITY): Payer: Medicare HMO

## 2015-11-10 ENCOUNTER — Encounter (HOSPITAL_COMMUNITY): Payer: Medicare HMO

## 2015-11-13 ENCOUNTER — Encounter (HOSPITAL_COMMUNITY): Payer: Medicare HMO

## 2015-11-15 ENCOUNTER — Encounter (HOSPITAL_COMMUNITY): Payer: Medicare HMO

## 2015-11-17 ENCOUNTER — Encounter (HOSPITAL_COMMUNITY): Payer: Medicare HMO

## 2015-11-17 DIAGNOSIS — I2510 Atherosclerotic heart disease of native coronary artery without angina pectoris: Secondary | ICD-10-CM | POA: Diagnosis not present

## 2015-11-17 DIAGNOSIS — I10 Essential (primary) hypertension: Secondary | ICD-10-CM | POA: Diagnosis not present

## 2015-11-17 DIAGNOSIS — D649 Anemia, unspecified: Secondary | ICD-10-CM | POA: Diagnosis not present

## 2015-11-17 DIAGNOSIS — I252 Old myocardial infarction: Secondary | ICD-10-CM | POA: Diagnosis not present

## 2015-11-17 DIAGNOSIS — K219 Gastro-esophageal reflux disease without esophagitis: Secondary | ICD-10-CM | POA: Diagnosis not present

## 2015-11-17 DIAGNOSIS — E785 Hyperlipidemia, unspecified: Secondary | ICD-10-CM | POA: Diagnosis not present

## 2015-11-20 ENCOUNTER — Encounter (HOSPITAL_COMMUNITY): Payer: Medicare HMO

## 2015-11-20 ENCOUNTER — Ambulatory Visit (INDEPENDENT_AMBULATORY_CARE_PROVIDER_SITE_OTHER): Payer: Commercial Managed Care - HMO | Admitting: Ophthalmology

## 2015-11-22 ENCOUNTER — Encounter (HOSPITAL_COMMUNITY): Payer: Medicare HMO

## 2015-11-24 ENCOUNTER — Encounter (HOSPITAL_COMMUNITY): Payer: Medicare HMO

## 2015-11-24 DIAGNOSIS — I10 Essential (primary) hypertension: Secondary | ICD-10-CM | POA: Diagnosis not present

## 2015-11-24 DIAGNOSIS — K219 Gastro-esophageal reflux disease without esophagitis: Secondary | ICD-10-CM | POA: Diagnosis not present

## 2015-11-24 DIAGNOSIS — J209 Acute bronchitis, unspecified: Secondary | ICD-10-CM | POA: Diagnosis not present

## 2015-11-24 DIAGNOSIS — I252 Old myocardial infarction: Secondary | ICD-10-CM | POA: Diagnosis not present

## 2015-11-24 DIAGNOSIS — E785 Hyperlipidemia, unspecified: Secondary | ICD-10-CM | POA: Diagnosis not present

## 2015-11-24 DIAGNOSIS — I2510 Atherosclerotic heart disease of native coronary artery without angina pectoris: Secondary | ICD-10-CM | POA: Diagnosis not present

## 2015-11-26 ENCOUNTER — Encounter (HOSPITAL_COMMUNITY): Payer: Self-pay | Admitting: *Deleted

## 2015-11-26 ENCOUNTER — Emergency Department (HOSPITAL_COMMUNITY): Payer: Medicare HMO

## 2015-11-26 ENCOUNTER — Emergency Department (HOSPITAL_COMMUNITY)
Admission: EM | Admit: 2015-11-26 | Discharge: 2015-11-26 | Disposition: A | Discharge status: Home / Self Care | Payer: Medicare HMO | Attending: Emergency Medicine | Admitting: Emergency Medicine

## 2015-11-26 ENCOUNTER — Other Ambulatory Visit: Payer: Self-pay

## 2015-11-26 DIAGNOSIS — Z8659 Personal history of other mental and behavioral disorders: Secondary | ICD-10-CM | POA: Insufficient documentation

## 2015-11-26 DIAGNOSIS — J159 Unspecified bacterial pneumonia: Secondary | ICD-10-CM | POA: Diagnosis not present

## 2015-11-26 DIAGNOSIS — R69 Illness, unspecified: Secondary | ICD-10-CM | POA: Diagnosis not present

## 2015-11-26 DIAGNOSIS — I2510 Atherosclerotic heart disease of native coronary artery without angina pectoris: Secondary | ICD-10-CM | POA: Diagnosis not present

## 2015-11-26 DIAGNOSIS — R531 Weakness: Secondary | ICD-10-CM | POA: Diagnosis present

## 2015-11-26 DIAGNOSIS — Z792 Long term (current) use of antibiotics: Secondary | ICD-10-CM | POA: Diagnosis not present

## 2015-11-26 DIAGNOSIS — Z9889 Other specified postprocedural states: Secondary | ICD-10-CM | POA: Diagnosis not present

## 2015-11-26 DIAGNOSIS — J189 Pneumonia, unspecified organism: Secondary | ICD-10-CM

## 2015-11-26 DIAGNOSIS — R918 Other nonspecific abnormal finding of lung field: Secondary | ICD-10-CM | POA: Diagnosis not present

## 2015-11-26 DIAGNOSIS — Z79899 Other long term (current) drug therapy: Secondary | ICD-10-CM | POA: Diagnosis not present

## 2015-11-26 HISTORY — DX: Atherosclerotic heart disease of native coronary artery without angina pectoris: I25.10

## 2015-11-26 LAB — BASIC METABOLIC PANEL
Anion gap: 10 (ref 5–15)
BUN: 9 mg/dL (ref 6–20)
CO2: 25 mmol/L (ref 22–32)
Calcium: 9.1 mg/dL (ref 8.9–10.3)
Chloride: 101 mmol/L (ref 101–111)
Creatinine, Ser: 0.73 mg/dL (ref 0.44–1.00)
GFR calc Af Amer: >60 mL/min (ref >60)
GFR calc non Af Amer: >60 mL/min (ref >60)
Glucose, Bld: 131 mg/dL — ABNORMAL HIGH (ref 65–99)
Potassium: 3.2 mmol/L — ABNORMAL LOW (ref 3.5–5.1)
Sodium: 136 mmol/L (ref 135–145)

## 2015-11-26 LAB — URINALYSIS, ROUTINE W REFLEX MICROSCOPIC
Bilirubin Urine: NEGATIVE
Glucose, UA: NEGATIVE mg/dL
Ketones, ur: NEGATIVE mg/dL
Nitrite: NEGATIVE
Protein, ur: NEGATIVE mg/dL
Specific Gravity, Urine: 1.019 (ref 1.005–1.030)
pH: 5.5 (ref 5.0–8.0)

## 2015-11-26 LAB — URINE MICROSCOPIC-ADD ON

## 2015-11-26 LAB — CBC
HCT: 29.5 % — ABNORMAL LOW (ref 36.0–46.0)
Hemoglobin: 9.7 g/dL — ABNORMAL LOW (ref 12.0–15.0)
MCH: 28.9 pg (ref 26.0–34.0)
MCHC: 32.9 g/dL (ref 30.0–36.0)
MCV: 87.8 fL (ref 78.0–100.0)
Platelets: 275 10*3/uL (ref 150–400)
RBC: 3.36 MIL/uL — ABNORMAL LOW (ref 3.87–5.11)
RDW: 12.9 % (ref 11.5–15.5)
WBC: 13.1 10*3/uL — ABNORMAL HIGH (ref 4.0–10.5)

## 2015-11-26 LAB — I-STAT CG4 LACTIC ACID, ED
Lactic Acid, Venous: 1.22 mmol/L (ref 0.5–2.0)
Lactic Acid, Venous: 0.95 mmol/L (ref 0.5–2.0)

## 2015-11-26 MED ORDER — LEVOFLOXACIN 750 MG PO TABS: 750.0000 mg | ORAL_TABLET | Freq: Every day | ORAL | Status: AC

## 2015-11-26 MED ORDER — DEXTROSE 5 % IV SOLN
500.0000 mg | Freq: Once | INTRAVENOUS | Status: AC
  Administered 2015-11-26: 500 mg via INTRAVENOUS
  Filled 2015-11-26: qty 500

## 2015-11-26 MED ORDER — LEVOFLOXACIN 750 MG PO TABS: 750.0000 mg | ORAL_TABLET | Freq: Every day | ORAL | Status: DC

## 2015-11-26 MED ORDER — SODIUM CHLORIDE 0.9 % IV BOLUS (SEPSIS)
1000.0000 mL | Freq: Once | INTRAVENOUS | Status: AC
  Administered 2015-11-26: 1000 mL via INTRAVENOUS

## 2015-11-26 MED ORDER — DEXTROSE 5 % IV SOLN
1.0000 g | Freq: Once | INTRAVENOUS | Status: AC
  Administered 2015-11-26: 1 g via INTRAVENOUS
  Filled 2015-11-26: qty 10

## 2015-11-26 NOTE — ED Notes (Signed)
PT saw Dr. Sharyn LullHarwani and was placed on Augmentin for possible sinus infection.  Pt not feeling better and is complaining of weakness, hoarse voice, cough non-productive, and low-grade fever

## 2015-11-26 NOTE — Discharge Instructions (Signed)

## 2015-11-26 NOTE — ED Provider Notes (Signed)
CSN: [id number]     Arrival date & time 11/26/15  1213 History   First MD Initiated Contact with Patient 11/26/15 1507     Chief Complaint  Patient presents with  . Weakness     (Consider location/radiation/quality/duration/timing/severity/associated sxs/prior Treatment) HPI 75 y.o. female presents to the ED noting progressively worsening cough since early last week. She saw her cardiologist for her sx and was rx'd augmentin and has been taking that since Friday evening to no avail of her sx. She notes generalized weakness and a low grade fever at home. She denies any productivity to her cough, but does not some intermittent post-tussive non-bloody emesis. She lives alone and has had some difficulty going about her normal ADLs due to her sx feeling like "a wet dish rag all the time." No hx of prior PNA. No known sick contacts or recent travel.    Past Medical History  Diagnosis Date  . Anxiety   . Coronary artery disease    Past Surgical History  Procedure Laterality Date  . Cardiac catheterization N/A 08/04/2015    Procedure: Left Heart Cath and Coronary Angiography;  Surgeon: Rinaldo CloudMohan Harwani, MD;  [facility];  Service: Cardiovascular;  Laterality: N/A;  . Cardiac catheterization N/A 08/08/2015    Procedure: Coronary Stent Intervention;  Surgeon: Rinaldo CloudMohan Harwani, MD;  Location: [facility] INVASIVE CV LAB;  Service: Cardiovascular;  Laterality: N/A;   No family history on file. Social History  Substance Use Topics  . Smoking status: Never Smoker   . Smokeless tobacco: None  . Alcohol Use: No   OB History    No data available     Review of Systems  Constitutional: Positive for fever, chills, activity change and fatigue.  HENT: Positive for congestion, rhinorrhea and sinus pressure.   Respiratory: Positive for cough and shortness of breath. Negative for chest tightness.   Cardiovascular: Negative for chest pain.  Gastrointestinal: Negative for nausea, vomiting, abdominal  pain and diarrhea.  Musculoskeletal: Negative for back pain and neck pain.  Skin: Negative for rash.  Neurological: Negative for dizziness, syncope and headaches.  All other systems reviewed and are negative.     Allergies  Review of patient's allergies indicates no known allergies.  Home Medications   Prior to Admission medications   Medication Sig Start Date End Date Taking? Authorizing Provider  acetaminophen (TYLENOL) 500 MG tablet Take 500 mg by mouth every 6 (six) hours as needed for headache.   Yes Historical Provider, MD  amoxicillin-clavulanate (AUGMENTIN) 875-125 MG tablet Take 1 tablet by mouth 2 (two) times daily. 11/24/15  Yes Historical Provider, MD  aspirin EC 81 MG EC tablet Take 1 tablet (81 mg total) by mouth daily. 08/11/15  Yes Rinaldo CloudMohan Harwani, MD  atorvastatin (LIPITOR) 80 MG tablet Take 1 tablet (80 mg total) by mouth daily at 6 PM. 08/11/15  Yes Rinaldo CloudMohan Harwani, MD  ferrous sulfate 325 (65 FE) MG tablet Take 1 tablet (325 mg total) by mouth 3 (three) times daily with meals. 08/11/15  Yes Rinaldo CloudMohan Harwani, MD  metoprolol tartrate (LOPRESSOR) 25 MG tablet Take 0.5 tablets (12.5 mg total) by mouth 2 (two) times daily. 08/11/15  Yes Rinaldo CloudMohan Harwani, MD  Multiple Vitamin (MULTIVITAMIN WITH MINERALS) TABS tablet Take 1 tablet by mouth 3 (three) times daily.   Yes Historical Provider, MD  nitroGLYCERIN (NITROSTAT) 0.4 MG SL tablet Place 1 tablet (0.4 mg total) under the tongue every 5 (five) minutes as needed for chest pain (CP or SOB). 08/11/15  Yes Rinaldo Cloud, MD  pantoprazole (PROTONIX) 40 MG tablet Take 1 tablet (40 mg total) by mouth daily at 6 (six) AM. 08/11/15  Yes Rinaldo Cloud, MD  ticagrelor (BRILINTA) 90 MG TABS tablet Take 1 tablet (90 mg total) by mouth 2 (two) times daily. 08/11/15  Yes Rinaldo Cloud, MD  bisacodyl (DULCOLAX) 5 MG EC tablet Take 1 tablet (5 mg total) by mouth daily as needed for moderate constipation. 08/11/15   Rinaldo Cloud, MD  levofloxacin (LEVAQUIN)  750 MG tablet Take 1 tablet (750 mg total) by mouth daily. 11/26/15 12/03/15  Francoise Ceo, DO   BP 141/53 mmHg  Pulse 114  Temp(Src) 98.3 F (36.8 C) (Oral)  Resp 17  SpO2 97% Physical Exam  Constitutional: She is oriented to person, place, and time. She appears well-developed and well-nourished. No distress.  HENT:  Head: Normocephalic and atraumatic.  Nose: Nose normal.  Mouth/Throat: Oropharynx is clear and moist.  Eyes: Conjunctivae and EOM are normal. Pupils are equal, round, and reactive to light.  Neck: Normal range of motion. Neck supple.  Cardiovascular: Normal rate, regular rhythm, normal heart sounds and intact distal pulses.   Pulmonary/Chest: Effort normal. No respiratory distress. She has no wheezes. She has rhonchi in the right lower field and the left lower field. She has rales. She exhibits no tenderness.  Abdominal: Soft. She exhibits no distension. There is no tenderness.  Musculoskeletal: She exhibits no edema or tenderness.  Neurological: She is alert and oriented to person, place, and time. No cranial nerve deficit. Coordination normal.  Skin: Skin is warm and dry. No rash noted. She is not diaphoretic.  Nursing note and vitals reviewed.   ED Course  Procedures (including critical care time) Labs Review Labs Reviewed  BASIC METABOLIC PANEL - Abnormal; Notable for the following:    Potassium 3.2 (*)    Glucose, Bld 131 (*)    All other components within normal limits  CBC - Abnormal; Notable for the following:    WBC 13.1 (*)    RBC 3.36 (*)    Hemoglobin 9.7 (*)    HCT 29.5 (*)    All other components within normal limits  URINALYSIS, ROUTINE W REFLEX MICROSCOPIC (NOT AT [facility]) - Abnormal; Notable for the following:    APPearance CLOUDY (*)    Hgb urine dipstick TRACE (*)    Leukocytes, UA SMALL (*)    All other components within normal limits  URINE MICROSCOPIC-ADD ON - Abnormal; Notable for the following:    Squamous Epithelial / LPF 0-5 (*)     Bacteria, UA RARE (*)    All other components within normal limits  I-STAT CG4 LACTIC ACID, ED  I-STAT CG4 LACTIC ACID, ED    Imaging Review Dg Chest 2 View  11/26/2015  CLINICAL DATA:  cough, weakness, and loss of appetite for a few days. EXAM: CHEST  2 VIEW COMPARISON:  08/04/2015, 10/20/2011 FINDINGS: Heart size and vascular pattern are normal. Pleural apical thickening on the right stable. Right lung clear otherwise appear Mild infiltrate posterior left lower lobe new from prior study. IMPRESSION: Subtle left lower lobe infiltrate could represent pneumonia. Followup PA and lateral chest X-ray is recommended in 3-4 weeks following trial of antibiotic therapy to ensure resolution and exclude underlying malignancy. Electronically Signed   By: Esperanza Heir M.D.   On: 11/26/2015 14:17   I have personally reviewed and evaluated these images and lab results as part of my medical decision-making.   EKG Interpretation  None      MDM  75 y.o. female with worsening cough and fatigue on augmentin since Friday to no avail of her sx. Physical exam, as above, AF, with tachycardia but normal BP, normal O2 and resp rate. Pulm exam for bibasilar rales and rhonchi, worse on the left. EKG shows sinus tach with intermittent PVCs but no evidence of acute ischemic changes. CXR shows LLL infiltrate concerning for PNA given hx. No recent admissions so felt to be CAP. Labs showed mild leukocytosis of 13.1, anemia similar to and improved from prior with hg of 9.7, normal lactate, electrolytes and renal fxn.  She was given a dose of rocephin and azithromycin and an IV fluid bolus in the ED and stated that she felt significantly better. She was able to ambulate without difficulty, nor hypoxia in the ED. She was started on levaquin and was recommended to follow up closely with her PCP in a few days. Strict return precautions were given. This plan was discussed with the patient at the bedside and she stated both  understanding and agreement.   Final diagnoses:  CAP (community acquired pneumonia)        Francoise Ceo, DO 11/27/15 1124  Blane Ohara, MD 12/03/15 [phone]

## 2015-11-26 NOTE — ED Notes (Signed)
Ambulated in hallway without difficulty, no SOB. sats 97% on R/A

## 2015-11-27 ENCOUNTER — Ambulatory Visit (INDEPENDENT_AMBULATORY_CARE_PROVIDER_SITE_OTHER): Payer: Commercial Managed Care - HMO | Admitting: Ophthalmology

## 2015-11-28 DIAGNOSIS — J45909 Unspecified asthma, uncomplicated: Secondary | ICD-10-CM | POA: Diagnosis not present

## 2015-11-28 DIAGNOSIS — J189 Pneumonia, unspecified organism: Secondary | ICD-10-CM | POA: Diagnosis not present

## 2015-12-07 DIAGNOSIS — J189 Pneumonia, unspecified organism: Secondary | ICD-10-CM | POA: Diagnosis not present

## 2015-12-19 DIAGNOSIS — E559 Vitamin D deficiency, unspecified: Secondary | ICD-10-CM | POA: Diagnosis not present

## 2015-12-19 DIAGNOSIS — D62 Acute posthemorrhagic anemia: Secondary | ICD-10-CM | POA: Diagnosis not present

## 2015-12-19 DIAGNOSIS — Z79899 Other long term (current) drug therapy: Secondary | ICD-10-CM | POA: Diagnosis not present

## 2015-12-19 DIAGNOSIS — I2510 Atherosclerotic heart disease of native coronary artery without angina pectoris: Secondary | ICD-10-CM | POA: Diagnosis not present

## 2015-12-19 DIAGNOSIS — Z0000 Encounter for general adult medical examination without abnormal findings: Secondary | ICD-10-CM | POA: Diagnosis not present

## 2015-12-28 ENCOUNTER — Ambulatory Visit
Admission: RE | Admit: 2015-12-28 | Discharge: 2015-12-28 | Disposition: A | Discharge status: Home / Self Care | Payer: Medicare HMO | Source: Ambulatory Visit | Attending: Family Medicine | Admitting: Family Medicine

## 2015-12-28 ENCOUNTER — Other Ambulatory Visit: Payer: Self-pay | Admitting: Family Medicine

## 2015-12-28 DIAGNOSIS — J189 Pneumonia, unspecified organism: Secondary | ICD-10-CM

## 2016-01-10 DIAGNOSIS — N952 Postmenopausal atrophic vaginitis: Secondary | ICD-10-CM | POA: Diagnosis not present

## 2016-02-08 ENCOUNTER — Ambulatory Visit (INDEPENDENT_AMBULATORY_CARE_PROVIDER_SITE_OTHER): Payer: Medicare HMO | Admitting: Ophthalmology

## 2016-02-08 DIAGNOSIS — H33302 Unspecified retinal break, left eye: Secondary | ICD-10-CM

## 2016-02-08 DIAGNOSIS — H353131 Nonexudative age-related macular degeneration, bilateral, early dry stage: Secondary | ICD-10-CM

## 2016-02-08 DIAGNOSIS — H35343 Macular cyst, hole, or pseudohole, bilateral: Secondary | ICD-10-CM | POA: Diagnosis not present

## 2016-02-16 DIAGNOSIS — E785 Hyperlipidemia, unspecified: Secondary | ICD-10-CM | POA: Diagnosis not present

## 2016-02-16 DIAGNOSIS — Z955 Presence of coronary angioplasty implant and graft: Secondary | ICD-10-CM | POA: Diagnosis not present

## 2016-02-16 DIAGNOSIS — I2510 Atherosclerotic heart disease of native coronary artery without angina pectoris: Secondary | ICD-10-CM | POA: Diagnosis not present

## 2016-02-16 DIAGNOSIS — I10 Essential (primary) hypertension: Secondary | ICD-10-CM | POA: Diagnosis not present

## 2016-02-16 DIAGNOSIS — K219 Gastro-esophageal reflux disease without esophagitis: Secondary | ICD-10-CM | POA: Diagnosis not present

## 2016-02-26 ENCOUNTER — Other Ambulatory Visit: Payer: Self-pay | Admitting: Family Medicine

## 2016-02-26 DIAGNOSIS — Z1231 Encounter for screening mammogram for malignant neoplasm of breast: Secondary | ICD-10-CM

## 2016-03-15 ENCOUNTER — Ambulatory Visit
Admission: RE | Admit: 2016-03-15 | Discharge: 2016-03-15 | Disposition: A | Discharge status: Home / Self Care | Payer: Medicare HMO | Source: Ambulatory Visit | Attending: Family Medicine | Admitting: Family Medicine

## 2016-03-15 DIAGNOSIS — Z1231 Encounter for screening mammogram for malignant neoplasm of breast: Secondary | ICD-10-CM

## 2016-03-15 DIAGNOSIS — R05 Cough: Secondary | ICD-10-CM | POA: Diagnosis not present

## 2016-03-15 DIAGNOSIS — N952 Postmenopausal atrophic vaginitis: Secondary | ICD-10-CM | POA: Diagnosis not present

## 2016-03-20 ENCOUNTER — Other Ambulatory Visit: Payer: Self-pay | Admitting: Family Medicine

## 2016-03-20 DIAGNOSIS — R928 Other abnormal and inconclusive findings on diagnostic imaging of breast: Secondary | ICD-10-CM

## 2016-03-28 ENCOUNTER — Ambulatory Visit
Admission: RE | Admit: 2016-03-28 | Discharge: 2016-03-28 | Disposition: A | Discharge status: Home / Self Care | Payer: Medicare HMO | Source: Ambulatory Visit | Attending: Family Medicine | Admitting: Family Medicine

## 2016-03-28 DIAGNOSIS — N952 Postmenopausal atrophic vaginitis: Secondary | ICD-10-CM | POA: Diagnosis not present

## 2016-03-28 DIAGNOSIS — Z23 Encounter for immunization: Secondary | ICD-10-CM | POA: Diagnosis not present

## 2016-03-28 DIAGNOSIS — R05 Cough: Secondary | ICD-10-CM | POA: Diagnosis not present

## 2016-03-28 DIAGNOSIS — R928 Other abnormal and inconclusive findings on diagnostic imaging of breast: Secondary | ICD-10-CM

## 2016-03-28 DIAGNOSIS — N63 Unspecified lump in breast: Secondary | ICD-10-CM | POA: Diagnosis not present

## 2016-04-02 ENCOUNTER — Ambulatory Visit
Admission: RE | Admit: 2016-04-02 | Discharge: 2016-04-02 | Disposition: A | Discharge status: Home / Self Care | Payer: Medicare HMO | Source: Ambulatory Visit | Attending: Family Medicine | Admitting: Family Medicine

## 2016-04-02 DIAGNOSIS — N6489 Other specified disorders of breast: Secondary | ICD-10-CM | POA: Diagnosis not present

## 2016-04-02 DIAGNOSIS — R928 Other abnormal and inconclusive findings on diagnostic imaging of breast: Secondary | ICD-10-CM | POA: Diagnosis not present

## 2016-04-22 DIAGNOSIS — Z0100 Encounter for examination of eyes and vision without abnormal findings: Secondary | ICD-10-CM | POA: Diagnosis not present

## 2016-05-22 DIAGNOSIS — I2510 Atherosclerotic heart disease of native coronary artery without angina pectoris: Secondary | ICD-10-CM | POA: Diagnosis not present

## 2016-05-22 DIAGNOSIS — E785 Hyperlipidemia, unspecified: Secondary | ICD-10-CM | POA: Diagnosis not present

## 2016-05-22 DIAGNOSIS — I252 Old myocardial infarction: Secondary | ICD-10-CM | POA: Diagnosis not present

## 2016-05-22 DIAGNOSIS — I10 Essential (primary) hypertension: Secondary | ICD-10-CM | POA: Diagnosis not present

## 2016-05-22 DIAGNOSIS — K219 Gastro-esophageal reflux disease without esophagitis: Secondary | ICD-10-CM | POA: Diagnosis not present

## 2016-06-17 DIAGNOSIS — R69 Illness, unspecified: Secondary | ICD-10-CM | POA: Diagnosis not present

## 2016-06-28 DIAGNOSIS — J209 Acute bronchitis, unspecified: Secondary | ICD-10-CM | POA: Diagnosis not present

## 2016-07-10 ENCOUNTER — Other Ambulatory Visit: Payer: Self-pay | Admitting: Family Medicine

## 2016-07-10 ENCOUNTER — Ambulatory Visit
Admission: RE | Admit: 2016-07-10 | Discharge: 2016-07-10 | Disposition: A | Discharge status: Home / Self Care | Payer: Medicare HMO | Source: Ambulatory Visit | Attending: Family Medicine | Admitting: Family Medicine

## 2016-07-10 DIAGNOSIS — J690 Pneumonitis due to inhalation of food and vomit: Secondary | ICD-10-CM

## 2016-07-10 DIAGNOSIS — R05 Cough: Secondary | ICD-10-CM | POA: Diagnosis not present

## 2016-08-28 ENCOUNTER — Other Ambulatory Visit: Payer: Self-pay | Admitting: Cardiology

## 2016-08-28 DIAGNOSIS — I252 Old myocardial infarction: Secondary | ICD-10-CM | POA: Diagnosis not present

## 2016-08-28 DIAGNOSIS — R079 Chest pain, unspecified: Secondary | ICD-10-CM

## 2016-08-28 DIAGNOSIS — I2510 Atherosclerotic heart disease of native coronary artery without angina pectoris: Secondary | ICD-10-CM | POA: Diagnosis not present

## 2016-08-28 DIAGNOSIS — K219 Gastro-esophageal reflux disease without esophagitis: Secondary | ICD-10-CM | POA: Diagnosis not present

## 2016-08-28 DIAGNOSIS — I10 Essential (primary) hypertension: Secondary | ICD-10-CM | POA: Diagnosis not present

## 2016-08-28 DIAGNOSIS — E785 Hyperlipidemia, unspecified: Secondary | ICD-10-CM | POA: Diagnosis not present

## 2016-08-30 DIAGNOSIS — R69 Illness, unspecified: Secondary | ICD-10-CM | POA: Diagnosis not present

## 2016-08-30 DIAGNOSIS — G3184 Mild cognitive impairment, so stated: Secondary | ICD-10-CM | POA: Diagnosis not present

## 2016-09-11 ENCOUNTER — Ambulatory Visit (HOSPITAL_COMMUNITY)
Admission: RE | Admit: 2016-09-11 | Discharge: 2016-09-11 | Disposition: A | Discharge status: Home / Self Care | Payer: Medicare HMO | Source: Ambulatory Visit | Attending: Cardiology | Admitting: Cardiology

## 2016-09-11 DIAGNOSIS — R079 Chest pain, unspecified: Secondary | ICD-10-CM | POA: Diagnosis not present

## 2016-09-11 DIAGNOSIS — E785 Hyperlipidemia, unspecified: Secondary | ICD-10-CM | POA: Diagnosis not present

## 2016-09-11 DIAGNOSIS — I10 Essential (primary) hypertension: Secondary | ICD-10-CM | POA: Diagnosis not present

## 2016-09-11 DIAGNOSIS — I252 Old myocardial infarction: Secondary | ICD-10-CM | POA: Diagnosis not present

## 2016-09-11 DIAGNOSIS — I2510 Atherosclerotic heart disease of native coronary artery without angina pectoris: Secondary | ICD-10-CM | POA: Diagnosis not present

## 2016-09-11 LAB — BASIC METABOLIC PANEL
Anion gap: 8 (ref 5–15)
BUN: 12 mg/dL (ref 6–20)
CO2: 29 mmol/L (ref 22–32)
Calcium: 9.3 mg/dL (ref 8.9–10.3)
Chloride: 106 mmol/L (ref 101–111)
Creatinine, Ser: 0.61 mg/dL (ref 0.44–1.00)
GFR calc Af Amer: >60 mL/min (ref >60)
GFR calc non Af Amer: >60 mL/min (ref >60)
Glucose, Bld: 115 mg/dL — ABNORMAL HIGH (ref 65–99)
Potassium: 3.8 mmol/L (ref 3.5–5.1)
Sodium: 143 mmol/L (ref 135–145)

## 2016-09-11 LAB — LIPID PANEL
Cholesterol: 119 mg/dL (ref 0–200)
HDL: 39 mg/dL — ABNORMAL LOW (ref >40)
LDL Cholesterol: 67 mg/dL (ref 0–99)
Total CHOL/HDL Ratio: 3.1 RATIO
Triglycerides: 64 mg/dL (ref <150)
VLDL: 13 mg/dL (ref 0–40)

## 2016-09-11 LAB — HEPATIC FUNCTION PANEL
ALT: 19 U/L (ref 14–54)
AST: 23 U/L (ref 15–41)
Albumin: 3.9 g/dL (ref 3.5–5.0)
Alkaline Phosphatase: 98 U/L (ref 38–126)
Bilirubin, Direct: 0.1 mg/dL (ref 0.1–0.5)
Indirect Bilirubin: 0.9 mg/dL (ref 0.3–0.9)
Total Bilirubin: 1 mg/dL (ref 0.3–1.2)
Total Protein: 6.5 g/dL (ref 6.5–8.1)

## 2016-09-11 MED ORDER — REGADENOSON 0.4 MG/5ML IV SOLN
INTRAVENOUS | Status: AC
  Administered 2016-09-11: 0.4 mg
  Filled 2016-09-11: qty 5

## 2016-09-11 MED ORDER — TECHNETIUM TC 99M TETROFOSMIN IV KIT
30.0000 | PACK | Freq: Once | INTRAVENOUS | Status: AC | PRN
  Administered 2016-09-11: 30 via INTRAVENOUS

## 2016-09-11 MED ORDER — TECHNETIUM TC 99M TETROFOSMIN IV KIT
10.0000 | PACK | Freq: Once | INTRAVENOUS | Status: AC | PRN
  Administered 2016-09-11: 10 via INTRAVENOUS

## 2016-10-01 DIAGNOSIS — I2510 Atherosclerotic heart disease of native coronary artery without angina pectoris: Secondary | ICD-10-CM | POA: Diagnosis not present

## 2016-10-01 DIAGNOSIS — Z7982 Long term (current) use of aspirin: Secondary | ICD-10-CM | POA: Diagnosis not present

## 2016-10-01 DIAGNOSIS — Z7902 Long term (current) use of antithrombotics/antiplatelets: Secondary | ICD-10-CM | POA: Diagnosis not present

## 2016-10-01 DIAGNOSIS — Z6824 Body mass index (BMI) 24.0-24.9, adult: Secondary | ICD-10-CM | POA: Diagnosis not present

## 2016-10-01 DIAGNOSIS — R69 Illness, unspecified: Secondary | ICD-10-CM | POA: Diagnosis not present

## 2016-10-01 DIAGNOSIS — K08409 Partial loss of teeth, unspecified cause, unspecified class: Secondary | ICD-10-CM | POA: Diagnosis not present

## 2016-10-01 DIAGNOSIS — K219 Gastro-esophageal reflux disease without esophagitis: Secondary | ICD-10-CM | POA: Diagnosis not present

## 2016-10-01 DIAGNOSIS — R233 Spontaneous ecchymoses: Secondary | ICD-10-CM | POA: Diagnosis not present

## 2016-10-01 DIAGNOSIS — G3184 Mild cognitive impairment, so stated: Secondary | ICD-10-CM | POA: Diagnosis not present

## 2016-10-01 DIAGNOSIS — Z0000 Encounter for general adult medical examination without abnormal findings: Secondary | ICD-10-CM | POA: Diagnosis not present

## 2016-12-12 DIAGNOSIS — I10 Essential (primary) hypertension: Secondary | ICD-10-CM | POA: Diagnosis not present

## 2016-12-12 DIAGNOSIS — E785 Hyperlipidemia, unspecified: Secondary | ICD-10-CM | POA: Diagnosis not present

## 2016-12-12 DIAGNOSIS — D649 Anemia, unspecified: Secondary | ICD-10-CM | POA: Diagnosis not present

## 2016-12-12 DIAGNOSIS — I2510 Atherosclerotic heart disease of native coronary artery without angina pectoris: Secondary | ICD-10-CM | POA: Diagnosis not present

## 2016-12-12 DIAGNOSIS — I252 Old myocardial infarction: Secondary | ICD-10-CM | POA: Diagnosis not present

## 2016-12-13 IMAGING — CR DG CHEST 1V PORT
1 series · 1 of 1 positions shown · non-contrast
Comparison: Chest x-ray dated 10/20/2011.

CLINICAL DATA: Intermittent episodes of midsternal chest pain over
the past week relieved with burping.

EXAM:
PORTABLE CHEST 1 VIEW

[AP]
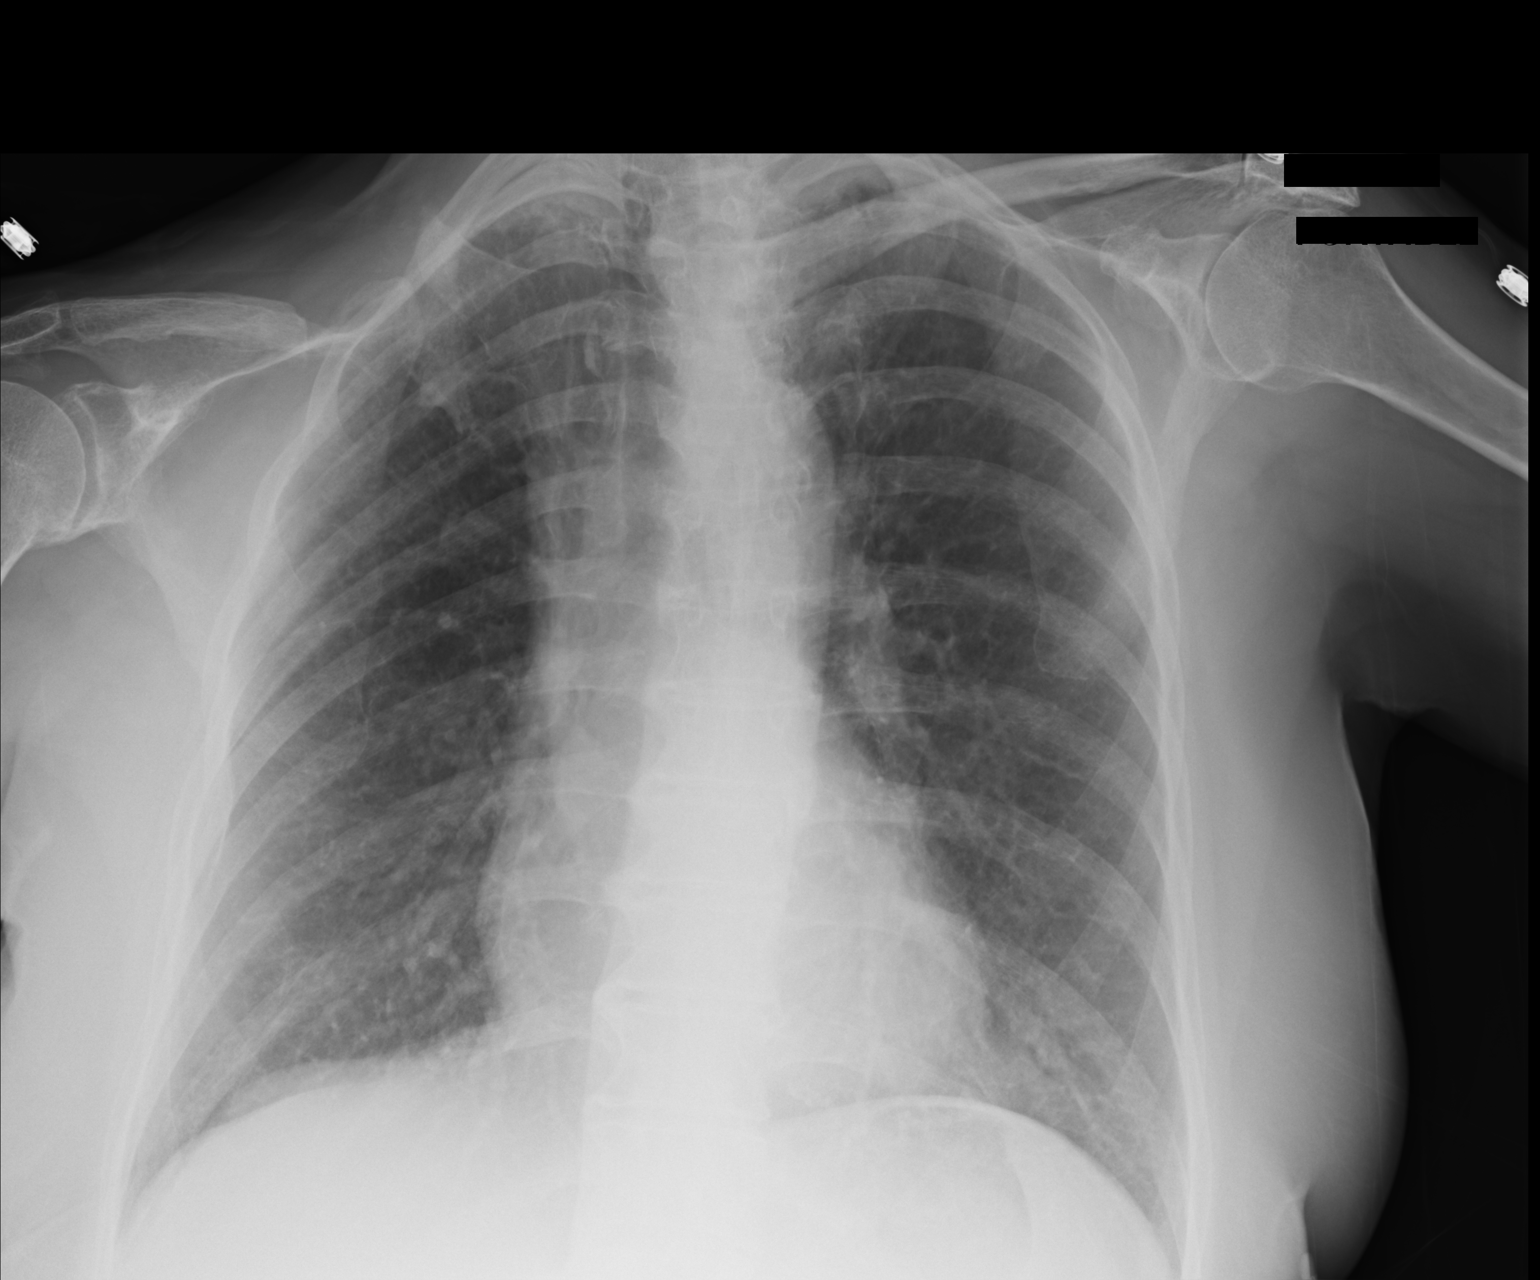

[1 of 1 positions shown; findings below may reference images not displayed]

FINDINGS: Heart size is normal. Overall cardiomediastinal silhouette is stable
in size and configuration.

There is stable mild pleural thickening at each lung apex. Mild
scarring/ fibrosis again noted at the lung bases. No new lung
findings. No evidence of pneumonia. No pleural effusion. No
pneumothorax. Osseous structures about the chest are unremarkable.
IMPRESSION: Stable chest x-ray. No evidence of acute cardiopulmonary
abnormality.

## 2016-12-31 DIAGNOSIS — I2510 Atherosclerotic heart disease of native coronary artery without angina pectoris: Secondary | ICD-10-CM | POA: Diagnosis not present

## 2016-12-31 DIAGNOSIS — N390 Urinary tract infection, site not specified: Secondary | ICD-10-CM | POA: Diagnosis not present

## 2016-12-31 DIAGNOSIS — G4733 Obstructive sleep apnea (adult) (pediatric): Secondary | ICD-10-CM | POA: Diagnosis not present

## 2016-12-31 DIAGNOSIS — Z79899 Other long term (current) drug therapy: Secondary | ICD-10-CM | POA: Diagnosis not present

## 2016-12-31 DIAGNOSIS — Z0000 Encounter for general adult medical examination without abnormal findings: Secondary | ICD-10-CM | POA: Diagnosis not present

## 2016-12-31 DIAGNOSIS — R350 Frequency of micturition: Secondary | ICD-10-CM | POA: Diagnosis not present

## 2016-12-31 DIAGNOSIS — K219 Gastro-esophageal reflux disease without esophagitis: Secondary | ICD-10-CM | POA: Diagnosis not present

## 2016-12-31 DIAGNOSIS — E7800 Pure hypercholesterolemia, unspecified: Secondary | ICD-10-CM | POA: Diagnosis not present

## 2016-12-31 DIAGNOSIS — E559 Vitamin D deficiency, unspecified: Secondary | ICD-10-CM | POA: Diagnosis not present

## 2017-01-14 DIAGNOSIS — J019 Acute sinusitis, unspecified: Secondary | ICD-10-CM | POA: Diagnosis not present

## 2017-02-14 ENCOUNTER — Ambulatory Visit (INDEPENDENT_AMBULATORY_CARE_PROVIDER_SITE_OTHER): Payer: Medicare HMO | Admitting: Ophthalmology

## 2017-02-15 DIAGNOSIS — R69 Illness, unspecified: Secondary | ICD-10-CM | POA: Diagnosis not present

## 2017-02-19 DIAGNOSIS — G4733 Obstructive sleep apnea (adult) (pediatric): Secondary | ICD-10-CM | POA: Diagnosis not present

## 2017-02-28 ENCOUNTER — Emergency Department (HOSPITAL_COMMUNITY): Payer: Medicare HMO

## 2017-02-28 ENCOUNTER — Encounter (HOSPITAL_COMMUNITY): Payer: Self-pay | Admitting: Emergency Medicine

## 2017-02-28 ENCOUNTER — Emergency Department (HOSPITAL_COMMUNITY)
Admission: EM | Admit: 2017-02-28 | Discharge: 2017-02-28 | Disposition: A | Discharge status: Home / Self Care | Payer: Medicare HMO | Attending: Emergency Medicine | Admitting: Emergency Medicine

## 2017-02-28 DIAGNOSIS — Z79899 Other long term (current) drug therapy: Secondary | ICD-10-CM | POA: Diagnosis not present

## 2017-02-28 DIAGNOSIS — M25511 Pain in right shoulder: Secondary | ICD-10-CM | POA: Insufficient documentation

## 2017-02-28 DIAGNOSIS — M791 Myalgia: Secondary | ICD-10-CM | POA: Diagnosis not present

## 2017-02-28 DIAGNOSIS — Z7902 Long term (current) use of antithrombotics/antiplatelets: Secondary | ICD-10-CM | POA: Diagnosis not present

## 2017-02-28 DIAGNOSIS — S4991XA Unspecified injury of right shoulder and upper arm, initial encounter: Secondary | ICD-10-CM | POA: Diagnosis not present

## 2017-02-28 DIAGNOSIS — M542 Cervicalgia: Secondary | ICD-10-CM | POA: Diagnosis not present

## 2017-02-28 DIAGNOSIS — I2510 Atherosclerotic heart disease of native coronary artery without angina pectoris: Secondary | ICD-10-CM | POA: Diagnosis not present

## 2017-02-28 DIAGNOSIS — M7918 Myalgia, other site: Secondary | ICD-10-CM

## 2017-02-28 MED ORDER — ACETAMINOPHEN 325 MG PO TABS
ORAL_TABLET | ORAL | Status: AC
  Filled 2017-02-28: qty 2

## 2017-02-28 MED ORDER — ACETAMINOPHEN 325 MG PO TABS
650.0000 mg | ORAL_TABLET | Freq: Once | ORAL | Status: AC
  Administered 2017-02-28: 650 mg via ORAL
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
## 2017-02-28 NOTE — ED Notes (Signed)
Pt provided water and snack at this time per PA Idol.
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
## 2017-03-17 DIAGNOSIS — E785 Hyperlipidemia, unspecified: Secondary | ICD-10-CM | POA: Diagnosis not present

## 2017-03-17 DIAGNOSIS — I2510 Atherosclerotic heart disease of native coronary artery without angina pectoris: Secondary | ICD-10-CM | POA: Diagnosis not present

## 2017-03-17 DIAGNOSIS — K219 Gastro-esophageal reflux disease without esophagitis: Secondary | ICD-10-CM | POA: Diagnosis not present

## 2017-03-17 DIAGNOSIS — I252 Old myocardial infarction: Secondary | ICD-10-CM | POA: Diagnosis not present

## 2017-03-17 DIAGNOSIS — I10 Essential (primary) hypertension: Secondary | ICD-10-CM | POA: Diagnosis not present

## 2017-04-03 DIAGNOSIS — G4733 Obstructive sleep apnea (adult) (pediatric): Secondary | ICD-10-CM | POA: Diagnosis not present

## 2017-05-03 DIAGNOSIS — G4733 Obstructive sleep apnea (adult) (pediatric): Secondary | ICD-10-CM | POA: Diagnosis not present

## 2017-06-03 DIAGNOSIS — G4733 Obstructive sleep apnea (adult) (pediatric): Secondary | ICD-10-CM | POA: Diagnosis not present

## 2017-06-25 DIAGNOSIS — G4733 Obstructive sleep apnea (adult) (pediatric): Secondary | ICD-10-CM | POA: Diagnosis not present

## 2017-07-03 DIAGNOSIS — G4733 Obstructive sleep apnea (adult) (pediatric): Secondary | ICD-10-CM | POA: Diagnosis not present

## 2017-11-04 DIAGNOSIS — I2510 Atherosclerotic heart disease of native coronary artery without angina pectoris: Secondary | ICD-10-CM | POA: Diagnosis not present

## 2017-11-04 DIAGNOSIS — E785 Hyperlipidemia, unspecified: Secondary | ICD-10-CM | POA: Diagnosis not present

## 2017-11-04 DIAGNOSIS — J309 Allergic rhinitis, unspecified: Secondary | ICD-10-CM | POA: Diagnosis not present

## 2017-11-04 DIAGNOSIS — K219 Gastro-esophageal reflux disease without esophagitis: Secondary | ICD-10-CM | POA: Diagnosis not present

## 2017-11-04 DIAGNOSIS — I10 Essential (primary) hypertension: Secondary | ICD-10-CM | POA: Diagnosis not present

## 2017-11-04 DIAGNOSIS — I252 Old myocardial infarction: Secondary | ICD-10-CM | POA: Diagnosis not present

## 2017-11-07 DIAGNOSIS — R69 Illness, unspecified: Secondary | ICD-10-CM | POA: Diagnosis not present

## 2017-11-07 DIAGNOSIS — J329 Chronic sinusitis, unspecified: Secondary | ICD-10-CM | POA: Diagnosis not present

## 2017-11-21 DIAGNOSIS — I2510 Atherosclerotic heart disease of native coronary artery without angina pectoris: Secondary | ICD-10-CM | POA: Diagnosis not present

## 2017-11-21 DIAGNOSIS — I10 Essential (primary) hypertension: Secondary | ICD-10-CM | POA: Diagnosis not present

## 2017-11-21 DIAGNOSIS — E785 Hyperlipidemia, unspecified: Secondary | ICD-10-CM | POA: Diagnosis not present

## 2018-02-05 DIAGNOSIS — J209 Acute bronchitis, unspecified: Secondary | ICD-10-CM | POA: Diagnosis not present

## 2018-02-12 DIAGNOSIS — R69 Illness, unspecified: Secondary | ICD-10-CM | POA: Diagnosis not present

## 2018-02-12 DIAGNOSIS — Z79899 Other long term (current) drug therapy: Secondary | ICD-10-CM | POA: Diagnosis not present

## 2018-02-12 DIAGNOSIS — I2510 Atherosclerotic heart disease of native coronary artery without angina pectoris: Secondary | ICD-10-CM | POA: Diagnosis not present

## 2018-02-12 DIAGNOSIS — Z0000 Encounter for general adult medical examination without abnormal findings: Secondary | ICD-10-CM | POA: Diagnosis not present

## 2018-02-12 DIAGNOSIS — Z1211 Encounter for screening for malignant neoplasm of colon: Secondary | ICD-10-CM | POA: Diagnosis not present

## 2018-02-12 DIAGNOSIS — E785 Hyperlipidemia, unspecified: Secondary | ICD-10-CM | POA: Diagnosis not present

## 2018-02-12 DIAGNOSIS — E559 Vitamin D deficiency, unspecified: Secondary | ICD-10-CM | POA: Diagnosis not present

## 2018-02-12 DIAGNOSIS — D692 Other nonthrombocytopenic purpura: Secondary | ICD-10-CM | POA: Diagnosis not present

## 2018-02-18 ENCOUNTER — Encounter: Payer: Self-pay | Admitting: Neurology

## 2018-02-24 ENCOUNTER — Encounter: Payer: Self-pay | Admitting: Neurology

## 2018-02-24 ENCOUNTER — Ambulatory Visit: Payer: Medicare HMO | Admitting: Neurology

## 2018-02-24 ENCOUNTER — Other Ambulatory Visit: Payer: Self-pay

## 2018-02-24 VITALS — BP 138/72 | HR 69 | Ht 67.0 in | Wt 143.0 lb

## 2018-02-24 DIAGNOSIS — R413 Other amnesia: Secondary | ICD-10-CM | POA: Diagnosis not present

## 2018-02-24 DIAGNOSIS — K219 Gastro-esophageal reflux disease without esophagitis: Secondary | ICD-10-CM | POA: Diagnosis not present

## 2018-02-24 DIAGNOSIS — F0390 Unspecified dementia without behavioral disturbance: Secondary | ICD-10-CM | POA: Diagnosis not present

## 2018-02-24 DIAGNOSIS — E785 Hyperlipidemia, unspecified: Secondary | ICD-10-CM | POA: Diagnosis not present

## 2018-02-24 DIAGNOSIS — I2510 Atherosclerotic heart disease of native coronary artery without angina pectoris: Secondary | ICD-10-CM | POA: Diagnosis not present

## 2018-02-24 DIAGNOSIS — R69 Illness, unspecified: Secondary | ICD-10-CM | POA: Diagnosis not present

## 2018-02-24 DIAGNOSIS — F03A0 Unspecified dementia, mild, without behavioral disturbance, psychotic disturbance, mood disturbance, and anxiety: Secondary | ICD-10-CM

## 2018-02-24 DIAGNOSIS — I10 Essential (primary) hypertension: Secondary | ICD-10-CM | POA: Diagnosis not present

## 2018-02-24 DIAGNOSIS — I252 Old myocardial infarction: Secondary | ICD-10-CM | POA: Diagnosis not present

## 2018-02-24 DIAGNOSIS — J309 Allergic rhinitis, unspecified: Secondary | ICD-10-CM | POA: Diagnosis not present

## 2018-02-24 MED ORDER — DONEPEZIL HCL 10 MG PO TABS: ORAL_TABLET | ORAL | 11 refills | Status: DC

## 2018-02-24 NOTE — Patient Instructions (Addendum)
1. Start Aricept (Donepezil) 10mg : take 1/2 tablet daily for 2 weeks, then increase to 1 tablet daily  2. Schedule MRI brain without contrast  We have sent a referral to [facility] for your MRI and they will call you directly to schedule your appt. They are located at [address] [facility]. If you need to contact them directly please call [phone].    3. No long-distance driving, restrict driving to 5 mile radius, daytime driving.   4. Follow-up in 6 months, call for any changes  FALL PRECAUTIONS: Be cautious when walking. Scan the area for obstacles that may increase the risk of trips and falls. When getting up in the mornings, sit up at the edge of the bed for a few minutes before getting out of bed. Consider elevating the bed at the head end to avoid drop of blood pressure when getting up. Walk always in a well-lit room (use night lights in the walls). Avoid area rugs or power cords from appliances in the middle of the walkways. Use a walker or a cane if necessary and consider physical therapy for balance exercise. Get your eyesight checked regularly.  FINANCIAL OVERSIGHT: Supervision, especially oversight when making financial decisions or transactions is also recommended.  HOME SAFETY: Consider the safety of the kitchen when operating appliances like stoves, microwave oven, and blender. Consider having supervision and share cooking responsibilities until no longer able to participate in those. Accidents with firearms and other hazards in the house should be identified and addressed as well.  DRIVING: Regarding driving, in patients with progressive memory problems, driving will be impaired. We advise to have someone else do the driving if trouble finding directions or if minor accidents are reported. Independent driving assessment is available to determine safety of driving.  ABILITY TO BE LEFT ALONE: If patient is unable to contact 911 operator, consider using LifeLine, or when the  need is there, arrange for someone to stay with patients. Smoking is a fire hazard, consider supervision or cessation. Risk of wandering should be assessed by caregiver and if detected at any point, supervision and safe proof recommendations should be instituted.  MEDICATION SUPERVISION: Inability to self-administer medication needs to be constantly addressed. Implement a mechanism to ensure safe administration of the medications.  RECOMMENDATIONS FOR ALL PATIENTS WITH MEMORY PROBLEMS: 1. Continue to exercise (Recommend 30 minutes of walking everyday, or 3 hours every week) 2. Increase social interactions - continue going to [place] and enjoy social gatherings with friends and family 3. Eat healthy, avoid fried foods and eat more fruits and vegetables 4. Maintain adequate blood pressure, blood sugar, and blood cholesterol level. Reducing the risk of stroke and cardiovascular disease also helps promoting better memory. 5. Avoid stressful situations. Live a simple life and avoid aggravations. Organize your time and prepare for the next day in anticipation. 6. Sleep well, avoid any interruptions of sleep and avoid any distractions in the bedroom that may interfere with adequate sleep quality 7. Avoid sugar, avoid sweets as there is a strong link between excessive sugar intake, diabetes, and cognitive impairment We discussed the Mediterranean diet, which has been shown to help patients reduce the risk of progressive memory disorders and reduces cardiovascular risk. This includes eating fish, eat fruits and green leafy vegetables, nuts like almonds and hazelnuts, walnuts, and also use olive oil. Avoid fast foods and fried foods as much as possible. Avoid sweets and sugar as sugar use has been linked to worsening of memory function.  There is  always a concern of gradual progression of memory problems. If this is the case, then we may need to adjust level of care according to patient needs. Support, both to  the patient and caregiver, should then be put into place.

## 2018-02-24 NOTE — Progress Notes (Signed)
NEUROLOGY CONSULTATION NOTE  Tiffany Mack MRN: [id number] DOB: [birth year]  Referring provider: Dr. Mila Palmer Primary care provider: Dr. Mila Palmer  Reason for consult:  dementia  Dear Dr Paulino Rily:  Thank you for your kind referral of Tiffany Mack for consultation of the above symptoms. Although her history is well known to you, please allow me to reiterate it for the purpose of our medical record. The patient was accompanied to the clinic by her son who also provides collateral information. Records and images were personally reviewed where available.  HISTORY OF PRESENT ILLNESS: This is a 77 year old right-handed woman with a history of hyperlipidemia, sleep apnea, presenting for evaluation of worsening memory. She knows her memory is not as good as it used to be, she states she forgets dates. Her son started noticing memory changes over the past year. She would occasionally repeat herself and would misplace things frequently. She would hide her purse in weird places. He started helping her with managing finances after she missed a few bills 6 months and with house utilities. She was missing medications and now uses a Pillpak which has helped. She denies getting lost driving, but her son reminds her that she ended up in [place] 2 months ago, she was going to [place] which is [place], but went [place] and ended up in [place]. She does not remember this. Her son reports she is fine with local driving. Her son thinks she had a hallucination 2-3 months ago, she was convinced someone walked in the house while she was washing dishes and never left. She says a little girl came in the sun room to the back of the house. No personality changes, her son reports she has always been OCD and has difficulties sitting still, always locking doors. No wandering behavior at night, sleep is good. She had an MMSE with Dr. Paulino Rily in 08/2016, 25/30, most recently on 01/2018 it was 20/30. Her  mother had memory issues. She denies any history of significant head injuries, she rarely drinks alcohol. She has occasional constipation, otherwise denies any headaches, dizziness, diplopia, dysarthria, dysphagia, neck/back pain, focal numbness/tingling/weakness, bladder dysfunction. No anosmia, tremors, no falls.  Laboratory Data: TSH and B12 normal  PAST MEDICAL HISTORY: Past Medical History:  Diagnosis Date  . Anxiety   . Coronary artery disease     PAST SURGICAL HISTORY: Past Surgical History:  Procedure Laterality Date  . CARDIAC CATHETERIZATION N/A 08/04/2015   Procedure: Left Heart Cath and Coronary Angiography;  Surgeon: Rinaldo Cloud, MD;  Location: [facility] INVASIVE CV LAB;  Service: Cardiovascular;  Laterality: N/A;  . CARDIAC CATHETERIZATION N/A 08/08/2015   Procedure: Coronary Stent Intervention;  Surgeon: Rinaldo Cloud, MD;  Location: [facility] INVASIVE CV LAB;  Service: Cardiovascular;  Laterality: N/A;    MEDICATIONS: Current Outpatient Medications on File Prior to Visit  Medication Sig Dispense Refill  . acetaminophen (TYLENOL) 500 MG tablet Take 500 mg by mouth every 6 (six) hours as needed for headache.    Marland Kitchen amoxicillin-clavulanate (AUGMENTIN) 875-125 MG tablet Take 1 tablet by mouth 2 (two) times daily.    . Artificial Tear Ointment (DRY EYES OP) Apply 1 drop to eye daily as needed (dry eyes).    Marland Kitchen aspirin EC 81 MG EC tablet Take 1 tablet (81 mg total) by mouth daily. 30 tablet 3  . atorvastatin (LIPITOR) 80 MG tablet Take 1 tablet (80 mg total) by mouth daily at 6 PM. 30 tablet 3  . bisacodyl (DULCOLAX) 5 MG EC tablet  Take 1 tablet (5 mg total) by mouth daily as needed for moderate constipation. 30 tablet 0  . clopidogrel (PLAVIX) 75 MG tablet Take 75 mg by mouth daily.    . ferrous sulfate 325 (65 FE) MG tablet Take 1 tablet (325 mg total) by mouth 3 (three) times daily with meals. 90 tablet 3  . Menthol, Topical Analgesic, (BIOFREEZE ROLL-ON EX) Apply 1 application topically  daily as needed (pain).    . metoprolol tartrate (LOPRESSOR) 25 MG tablet Take 0.5 tablets (12.5 mg total) by mouth 2 (two) times daily. (Patient taking differently: Take 25 mg by mouth daily. ) 60 tablet 3  . Multiple Vitamin (MULTIVITAMIN WITH MINERALS) TABS tablet Take 1 tablet by mouth 3 (three) times daily.    . nitroGLYCERIN (NITROSTAT) 0.4 MG SL tablet Place 1 tablet (0.4 mg total) under the tongue every 5 (five) minutes as needed for chest pain (CP or SOB). 25 tablet 12  . pantoprazole (PROTONIX) 40 MG tablet Take 1 tablet (40 mg total) by mouth daily at 6 (six) AM. 30 tablet 3  . ticagrelor (BRILINTA) 90 MG TABS tablet Take 1 tablet (90 mg total) by mouth 2 (two) times daily. 60 tablet 11  . venlafaxine (EFFEXOR) 37.5 MG tablet Take 1 tablet by mouth daily.     No current facility-administered medications on file prior to visit.     ALLERGIES: No Known Allergies  FAMILY HISTORY: No family history on file.  SOCIAL HISTORY: Social History   Socioeconomic History  . Marital status: Single    Spouse name: Not on file  . Number of children: Not on file  . Years of education: Not on file  . Highest education level: Not on file  Occupational History  . Not on file  Social Needs  . Financial resource strain: Not on file  . Food insecurity:    Worry: Not on file    Inability: Not on file  . Transportation needs:    Medical: Not on file    Non-medical: Not on file  Tobacco Use  . Smoking status: Never Smoker  . Smokeless tobacco: Never Used  Substance and Sexual Activity  . Alcohol use: No  . Drug use: No  . Sexual activity: Not on file  Lifestyle  . Physical activity:    Days per week: Not on file    Minutes per session: Not on file  . Stress: Not on file  Relationships  . Social connections:    Talks on phone: Not on file    Gets together: Not on file    Attends religious service: Not on file    Active member of club or organization: Not on file    Attends  meetings of clubs or organizations: Not on file    Relationship status: Not on file  . Intimate partner violence:    Fear of current or ex partner: Not on file    Emotionally abused: Not on file    Physically abused: Not on file    Forced sexual activity: Not on file  Other Topics Concern  . Not on file  Social History Narrative  . Not on file    REVIEW OF SYSTEMS: Constitutional: No fevers, chills, or sweats, no generalized fatigue, change in appetite Eyes: No visual changes, double vision, eye pain Ear, nose and throat: No hearing loss, ear pain, nasal congestion, sore throat Cardiovascular: No chest pain, palpitations Respiratory:  No shortness of breath at rest or with exertion, wheezes GastrointestinaI:  No nausea, vomiting, diarrhea, abdominal pain, fecal incontinence Genitourinary:  No dysuria, urinary retention or frequency Musculoskeletal:  No neck pain, back pain Integumentary: No rash, pruritus, skin lesions Neurological: as above Psychiatric: No depression, insomnia, anxiety Endocrine: No palpitations, fatigue, diaphoresis, mood swings, change in appetite, change in weight, increased thirst Hematologic/Lymphatic:  No anemia, purpura, petechiae. Allergic/Immunologic: no itchy/runny eyes, nasal congestion, recent allergic reactions, rashes  PHYSICAL EXAM: Vitals:   02/24/18 1407  BP: 138/72  Pulse: 69  SpO2: 98%   General: No acute distress Head:  Normocephalic/atraumatic Eyes: Fundoscopic exam shows bilateral sharp discs, no vessel changes, exudates, or hemorrhages Neck: supple, no paraspinal tenderness, full range of motion Back: No paraspinal tenderness Heart: regular rate and rhythm Lungs: Clear to auscultation bilaterally. Vascular: No carotid bruits. Skin/Extremities: No rash, no edema Neurological Exam: Mental status: alert and oriented to person, place, and time, no dysarthria or aphasia, Fund of knowledge is appropriate.  Recent and remote memory are  intact.  Attention and concentration are normal.    Able to name objects and repeat phrases.  Montreal Cognitive Assessment  02/24/2018  Visuospatial/ Executive (0/5) 2  Naming (0/3) 2  Attention: Read list of digits (0/2) 2  Attention: Read list of letters (0/1) 1  Attention: Serial 7 subtraction starting at 100 (0/3) 3  Language: Repeat phrase (0/2) 2  Language : Fluency (0/1) 0  Abstraction (0/2) 2  Delayed Recall (0/5) 0  Orientation (0/6) 6  Total 20   Cranial nerves: CN I: not tested CN II: pupils equal, round and reactive to light, visual fields intact, fundi unremarkable. CN III, IV, VI:  full range of motion, no nystagmus, no ptosis CN V: facial sensation intact CN VII: upper and lower face symmetric CN VIII: hearing intact to finger rub CN IX, X: gag intact, uvula midline CN XI: sternocleidomastoid and trapezius muscles intact CN XII: tongue midline Bulk & Tone: normal, no fasciculations. Motor: 5/5 throughout with no pronator drift. Sensation: intact to light touch, cold, pin, vibration and joint position sense.  No extinction to double simultaneous stimulation.  Romberg test negative Deep Tendon Reflexes: +2 throughout, no ankle clonus Plantar responses: downgoing bilaterally Cerebellar: no incoordination on finger to nose, heel to shin. No dysdiadochokinesia Gait: narrow-based and steady, difficulty with tandem walk Tremor: none  IMPRESSION: This is a 77 year old right-handed woman with a history of hyperlipidemia, sleep apnea, presenting for evaluation of worsening memory. 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 There may have been one instance of visual hallucinations but this has not been consistent. MRI brain without contrast will be ordered to assess for underlying structural abnormality. We discussed starting Aricept, including side effects and expectations from the medication. We  discussed driving restriction to only daytime driving within a 5 mile radius, and if difficulties arise with this, to stop driving. Her son will start monitoring medication intake more closely. We discussed the importance of control of vascular risk factors, physical exercise, and brain stimulation exercises. She will follow-up in 6 months and knows to call for any changes.   Thank you for allowing me to participate in the care of this patient. Please do not hesitate to call for any questions or concerns.   Patrcia Dolly, M.D.  CC: Dr. Mick Sell

## 2018-03-17 DIAGNOSIS — R197 Diarrhea, unspecified: Secondary | ICD-10-CM | POA: Diagnosis not present

## 2018-03-17 DIAGNOSIS — R112 Nausea with vomiting, unspecified: Secondary | ICD-10-CM | POA: Diagnosis not present

## 2018-03-17 DIAGNOSIS — R300 Dysuria: Secondary | ICD-10-CM | POA: Diagnosis not present

## 2018-03-17 DIAGNOSIS — Z1211 Encounter for screening for malignant neoplasm of colon: Secondary | ICD-10-CM | POA: Diagnosis not present

## 2018-03-18 ENCOUNTER — Inpatient Hospital Stay: Admission: RE | Admit: 2018-03-18 | Payer: Medicare HMO | Source: Ambulatory Visit

## 2018-03-21 ENCOUNTER — Inpatient Hospital Stay (HOSPITAL_COMMUNITY)
Admission: EM | Admit: 2018-03-21 | Discharge: 2018-03-24 | DRG: 690 | Disposition: A | Discharge status: Home / Self Care | Payer: Medicare HMO | Attending: Internal Medicine | Admitting: Internal Medicine

## 2018-03-21 ENCOUNTER — Other Ambulatory Visit: Payer: Self-pay

## 2018-03-21 ENCOUNTER — Encounter (HOSPITAL_COMMUNITY): Payer: Self-pay

## 2018-03-21 ENCOUNTER — Emergency Department (HOSPITAL_COMMUNITY): Payer: Medicare HMO

## 2018-03-21 DIAGNOSIS — I2510 Atherosclerotic heart disease of native coronary artery without angina pectoris: Secondary | ICD-10-CM | POA: Diagnosis present

## 2018-03-21 DIAGNOSIS — R197 Diarrhea, unspecified: Secondary | ICD-10-CM | POA: Diagnosis not present

## 2018-03-21 DIAGNOSIS — E440 Moderate protein-calorie malnutrition: Secondary | ICD-10-CM

## 2018-03-21 DIAGNOSIS — R1111 Vomiting without nausea: Secondary | ICD-10-CM | POA: Diagnosis not present

## 2018-03-21 DIAGNOSIS — B9620 Unspecified Escherichia coli [E. coli] as the cause of diseases classified elsewhere: Secondary | ICD-10-CM | POA: Diagnosis present

## 2018-03-21 DIAGNOSIS — N390 Urinary tract infection, site not specified: Secondary | ICD-10-CM | POA: Diagnosis not present

## 2018-03-21 DIAGNOSIS — R69 Illness, unspecified: Secondary | ICD-10-CM | POA: Diagnosis not present

## 2018-03-21 DIAGNOSIS — R112 Nausea with vomiting, unspecified: Secondary | ICD-10-CM | POA: Diagnosis not present

## 2018-03-21 DIAGNOSIS — G309 Alzheimer's disease, unspecified: Secondary | ICD-10-CM | POA: Diagnosis not present

## 2018-03-21 DIAGNOSIS — E860 Dehydration: Secondary | ICD-10-CM | POA: Diagnosis not present

## 2018-03-21 DIAGNOSIS — F419 Anxiety disorder, unspecified: Secondary | ICD-10-CM | POA: Diagnosis present

## 2018-03-21 DIAGNOSIS — Z7902 Long term (current) use of antithrombotics/antiplatelets: Secondary | ICD-10-CM | POA: Diagnosis not present

## 2018-03-21 DIAGNOSIS — N3080 Other cystitis without hematuria: Secondary | ICD-10-CM | POA: Diagnosis present

## 2018-03-21 DIAGNOSIS — R1110 Vomiting, unspecified: Secondary | ICD-10-CM | POA: Diagnosis not present

## 2018-03-21 DIAGNOSIS — R1013 Epigastric pain: Secondary | ICD-10-CM | POA: Diagnosis not present

## 2018-03-21 DIAGNOSIS — E875 Hyperkalemia: Secondary | ICD-10-CM | POA: Diagnosis present

## 2018-03-21 DIAGNOSIS — E785 Hyperlipidemia, unspecified: Secondary | ICD-10-CM | POA: Diagnosis present

## 2018-03-21 DIAGNOSIS — I252 Old myocardial infarction: Secondary | ICD-10-CM | POA: Diagnosis not present

## 2018-03-21 DIAGNOSIS — E876 Hypokalemia: Secondary | ICD-10-CM | POA: Diagnosis present

## 2018-03-21 DIAGNOSIS — F0390 Unspecified dementia without behavioral disturbance: Secondary | ICD-10-CM | POA: Diagnosis present

## 2018-03-21 DIAGNOSIS — Z9861 Coronary angioplasty status: Secondary | ICD-10-CM

## 2018-03-21 DIAGNOSIS — Z79899 Other long term (current) drug therapy: Secondary | ICD-10-CM

## 2018-03-21 DIAGNOSIS — F0280 Dementia in other diseases classified elsewhere without behavioral disturbance: Secondary | ICD-10-CM | POA: Diagnosis not present

## 2018-03-21 DIAGNOSIS — I10 Essential (primary) hypertension: Secondary | ICD-10-CM | POA: Diagnosis not present

## 2018-03-21 HISTORY — DX: Unspecified dementia, unspecified severity, without behavioral disturbance, psychotic disturbance, mood disturbance, and anxiety: F03.90

## 2018-03-21 LAB — CBC
HCT: 42.5 % (ref 36.0–46.0)
Hemoglobin: 14.1 g/dL (ref 12.0–15.0)
MCH: 29.4 pg (ref 26.0–34.0)
MCHC: 33.2 g/dL (ref 30.0–36.0)
MCV: 88.5 fL (ref 78.0–100.0)
Platelets: 239 10*3/uL (ref 150–400)
RBC: 4.8 MIL/uL (ref 3.87–5.11)
RDW: 12.1 % (ref 11.5–15.5)
WBC: 9.6 10*3/uL (ref 4.0–10.5)

## 2018-03-21 LAB — TROPONIN I: Troponin I: <0.03 ng/mL (ref <0.03)

## 2018-03-21 LAB — COMPREHENSIVE METABOLIC PANEL
ALT: 22 U/L (ref 0–44)
AST: 22 U/L (ref 15–41)
Albumin: 3.9 g/dL (ref 3.5–5.0)
Alkaline Phosphatase: 94 U/L (ref 38–126)
Anion gap: 11 (ref 5–15)
BUN: <5 mg/dL — ABNORMAL LOW (ref 8–23)
CO2: 31 mmol/L (ref 22–32)
Calcium: 9.7 mg/dL (ref 8.9–10.3)
Chloride: 97 mmol/L — ABNORMAL LOW (ref 98–111)
Creatinine, Ser: 0.87 mg/dL (ref 0.44–1.00)
GFR calc Af Amer: >60 mL/min (ref >60)
GFR calc non Af Amer: >60 mL/min (ref >60)
Glucose, Bld: 132 mg/dL — ABNORMAL HIGH (ref 70–99)
Potassium: 3.2 mmol/L — ABNORMAL LOW (ref 3.5–5.1)
Sodium: 139 mmol/L (ref 135–145)
Total Bilirubin: 1.4 mg/dL — ABNORMAL HIGH (ref 0.3–1.2)
Total Protein: 6.9 g/dL (ref 6.5–8.1)

## 2018-03-21 LAB — URINALYSIS, ROUTINE W REFLEX MICROSCOPIC
Bilirubin Urine: NEGATIVE
Glucose, UA: NEGATIVE mg/dL
Ketones, ur: NEGATIVE mg/dL
Nitrite: POSITIVE — AB
Protein, ur: NEGATIVE mg/dL
Specific Gravity, Urine: 1.009 (ref 1.005–1.030)
Trans Epithel, UA: 1
WBC, UA: >50 WBC/hpf — ABNORMAL HIGH (ref 0–5)
pH: 6 (ref 5.0–8.0)

## 2018-03-21 LAB — I-STAT CHEM 8, ED
BUN: 5 mg/dL — ABNORMAL LOW (ref 8–23)
Calcium, Ion: 1.18 mmol/L (ref 1.15–1.40)
Chloride: 95 mmol/L — ABNORMAL LOW (ref 98–111)
Creatinine, Ser: 0.7 mg/dL (ref 0.44–1.00)
Glucose, Bld: 123 mg/dL — ABNORMAL HIGH (ref 70–99)
HCT: 44 % (ref 36.0–46.0)
Hemoglobin: 15 g/dL (ref 12.0–15.0)
Potassium: 2.7 mmol/L — CL (ref 3.5–5.1)
Sodium: 140 mmol/L (ref 135–145)
TCO2: 31 mmol/L (ref 22–32)

## 2018-03-21 LAB — I-STAT CG4 LACTIC ACID, ED
Lactic Acid, Venous: 1.73 mmol/L (ref 0.5–1.9)
Lactic Acid, Venous: 1.21 mmol/L (ref 0.5–1.9)

## 2018-03-21 LAB — LIPASE, BLOOD: Lipase: 256 U/L — ABNORMAL HIGH (ref 11–51)

## 2018-03-21 MED ORDER — SODIUM CHLORIDE 0.9 % IV BOLUS
500.0000 mL | Freq: Once | INTRAVENOUS | Status: AC
  Administered 2018-03-21: 500 mL via INTRAVENOUS

## 2018-03-21 MED ORDER — ENOXAPARIN SODIUM 40 MG/0.4ML ~~LOC~~ SOLN
40.0000 mg | SUBCUTANEOUS | Status: DC
  Administered 2018-03-21 – 2018-03-23 (×3): 40 mg via SUBCUTANEOUS
  Filled 2018-03-21 (×3): qty 0.4

## 2018-03-21 MED ORDER — ONDANSETRON HCL 4 MG PO TABS: 4.0000 mg | ORAL_TABLET | Freq: Four times a day (QID) | ORAL | Status: DC | PRN

## 2018-03-21 MED ORDER — IOPAMIDOL (ISOVUE-300) INJECTION 61%
100.0000 mL | Freq: Once | INTRAVENOUS | Status: AC | PRN
  Administered 2018-03-21: 100 mL via INTRAVENOUS

## 2018-03-21 MED ORDER — VENLAFAXINE HCL ER 37.5 MG PO CP24
37.5000 mg | ORAL_CAPSULE | Freq: Every day | ORAL | Status: DC
  Administered 2018-03-22 – 2018-03-24 (×3): 37.5 mg via ORAL
  Filled 2018-03-21 (×3): qty 1

## 2018-03-21 MED ORDER — SODIUM CHLORIDE 0.9 % IV SOLN
1.0000 g | INTRAVENOUS | Status: DC
  Administered 2018-03-21: 1 g via INTRAVENOUS
  Filled 2018-03-21: qty 10

## 2018-03-21 MED ORDER — ACETAMINOPHEN 325 MG PO TABS: 650.0000 mg | ORAL_TABLET | Freq: Four times a day (QID) | ORAL | Status: DC | PRN

## 2018-03-21 MED ORDER — ALBUTEROL SULFATE (2.5 MG/3ML) 0.083% IN NEBU: 2.5000 mg | INHALATION_SOLUTION | RESPIRATORY_TRACT | Status: DC | PRN

## 2018-03-21 MED ORDER — ASPIRIN EC 81 MG PO TBEC
81.0000 mg | DELAYED_RELEASE_TABLET | Freq: Every day | ORAL | Status: DC
  Administered 2018-03-22 – 2018-03-24 (×3): 81 mg via ORAL
  Filled 2018-03-21 (×3): qty 1

## 2018-03-21 MED ORDER — ONDANSETRON HCL 4 MG/2ML IJ SOLN: 4.0000 mg | Freq: Four times a day (QID) | INTRAMUSCULAR | Status: DC | PRN

## 2018-03-21 MED ORDER — ATORVASTATIN CALCIUM 80 MG PO TABS
80.0000 mg | ORAL_TABLET | Freq: Every day | ORAL | Status: DC
  Administered 2018-03-21 – 2018-03-23 (×3): 80 mg via ORAL
  Filled 2018-03-21 (×3): qty 1

## 2018-03-21 MED ORDER — SODIUM CHLORIDE 0.9 % IV SOLN
1.0000 g | INTRAVENOUS | Status: AC
  Administered 2018-03-21: 1 g via INTRAVENOUS
  Filled 2018-03-21: qty 10

## 2018-03-21 MED ORDER — POTASSIUM CHLORIDE CRYS ER 20 MEQ PO TBCR
40.0000 meq | EXTENDED_RELEASE_TABLET | Freq: Once | ORAL | Status: AC
  Administered 2018-03-21: 40 meq via ORAL
  Filled 2018-03-21: qty 2

## 2018-03-21 MED ORDER — ACETAMINOPHEN 650 MG RE SUPP: 650.0000 mg | Freq: Four times a day (QID) | RECTAL | Status: DC | PRN

## 2018-03-21 MED ORDER — SODIUM CHLORIDE 0.9 % IV SOLN
1.0000 g | INTRAVENOUS | Status: DC
  Administered 2018-03-22: 1 g via INTRAVENOUS
  Filled 2018-03-21: qty 10

## 2018-03-21 MED ORDER — LORATADINE 10 MG PO TABS
10.0000 mg | ORAL_TABLET | Freq: Every day | ORAL | Status: DC
  Administered 2018-03-22 – 2018-03-24 (×3): 10 mg via ORAL
  Filled 2018-03-21 (×3): qty 1

## 2018-03-21 MED ORDER — METOPROLOL TARTRATE 12.5 MG HALF TABLET
12.5000 mg | ORAL_TABLET | Freq: Two times a day (BID) | ORAL | Status: DC
  Administered 2018-03-21 – 2018-03-24 (×5): 12.5 mg via ORAL
  Filled 2018-03-21 (×6): qty 1

## 2018-03-21 MED ORDER — MORPHINE SULFATE (PF) 4 MG/ML IV SOLN
4.0000 mg | Freq: Once | INTRAVENOUS | Status: DC
  Filled 2018-03-21: qty 1

## 2018-03-21 MED ORDER — POTASSIUM CHLORIDE IN NACL 20-0.9 MEQ/L-% IV SOLN
INTRAVENOUS | Status: DC
  Administered 2018-03-21 – 2018-03-22 (×3): via INTRAVENOUS
  Filled 2018-03-21 (×5): qty 1000

## 2018-03-21 MED ORDER — IOPAMIDOL (ISOVUE-300) INJECTION 61%
INTRAVENOUS | Status: AC
  Filled 2018-03-21: qty 100

## 2018-03-21 MED ORDER — NITROGLYCERIN 0.4 MG SL SUBL: 0.4000 mg | SUBLINGUAL_TABLET | SUBLINGUAL | Status: DC | PRN

## 2018-03-21 MED ORDER — SODIUM CHLORIDE 0.9 % IV SOLN
INTRAVENOUS | Status: DC
  Administered 2018-03-21: 15:00:00 via INTRAVENOUS

## 2018-03-21 MED ORDER — PANTOPRAZOLE SODIUM 40 MG PO TBEC
40.0000 mg | DELAYED_RELEASE_TABLET | Freq: Every day | ORAL | Status: DC
  Administered 2018-03-22 – 2018-03-24 (×3): 40 mg via ORAL
  Filled 2018-03-21 (×3): qty 1

## 2018-03-21 MED ORDER — PANTOPRAZOLE SODIUM 40 MG PO TBEC: 40.0000 mg | DELAYED_RELEASE_TABLET | Freq: Every day | ORAL | Status: DC

## 2018-03-21 NOTE — ED Triage Notes (Signed)
Patient complains of increased weakness following vomiting and diarrhea x 3 days. States that she has started with BRAT diet but states the weakness continuing, pale on arrival. Mild dementia per family. denies pain

## 2018-03-21 NOTE — H&P (Signed)
HISTORY AND PHYSICAL       PATIENT DETAILS Name: Tiffany Mack Age: 77 y.o. Sex: female Date of Birth: 03/20/1941 Admit Date: 03/21/2018 ZOX:WRUEAVW, Jasmine December, MD   Patient coming from: Home   CHIEF COMPLAINT:  Weakness, nausea, vomiting for the past 2-3 days Diarrhea (resolved) for 2 days approximately 5 days back.  HPI: Tiffany Mack is a 77 y.o. female with medical history significant of CAD status post PCI 2-3 years back, hypertension, dyslipidemia, mild dementia presents to the hospital for evaluation of the above-noted complaints.  Please note patient seems somewhat confused-hence most of the history is obtained after speaking with the patient's son at bedside.  Apparently this past Monday, patient developed few loose stools that the family describes as diarrhea.  This gradually resolved-however patient then started developing some vomiting, following which patient then started having weakness and decreased appetite.  Family is not aware of fever-but they did notice some tremors-and patient appeared flushed at times.  After repeatedly questioning-patient does acknowledge some frequency of urination and dysuria over the past few days.  She denies any pelvic pain.  She has not had diarrhea for the past couple of days-in fact has not had a bowel movement today.  Patient denies any abdominal pain.  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  I was subsequently asked to admit this patient for further evaluation and treatment.  ED Course:  Afebrile-stable vital signs.  UA positive for UTI, lipase was elevated-CT scan positive for air in the urinary bladder.  Patient also found to have hyperkalemia.  Started on IV fluids, potassium supplementation and IV Rocephin.  Note: Lives at: Home Mobility:  Independent Chronic Indwelling Foley:no   REVIEW  OF SYSTEMS:  Constitutional:   No  weight loss, night sweats  HEENT:    No headaches, Dysphagia,Tooth/dental problems,Sore throat  Cardio-vascular: No chest pain,Orthopnea, PND,lower extremity edem  GI:  No heartburn, indigestion, abdominal pain,  diarrhea, melena or hematochezia  Resp: No shortness of breath, cough, hemoptysis,plueritic chest pain.   Skin:  No rash or lesions.  GU:  No change in color of urine  No flank pain.  Musculoskeletal: No joint pain or swelling.  No decreased range of motion.  No back pain.  Endocrine: No heat intolerance, no cold intolerance, no polyuria, no polydipsia  Psych: No change in mood or affect.    ALLERGIES:  No Known Allergies  PAST MEDICAL HISTORY: Past Medical History:  Diagnosis Date  . Anxiety   . Coronary artery disease   . Dementia     PAST SURGICAL HISTORY: Past Surgical History:  Procedure Laterality Date  . CARDIAC CATHETERIZATION N/A 08/04/2015   Procedure: Left Heart Cath and Coronary Angiography;  Surgeon: Rinaldo Cloud, MD;  Location: [facility] INVASIVE CV LAB;  Service: Cardiovascular;  Laterality: N/A;  . CARDIAC CATHETERIZATION N/A 08/08/2015   Procedure: Coronary Stent Intervention;  Surgeon: Rinaldo Cloud, MD;  Location: [facility] INVASIVE CV LAB;  Service: Cardiovascular;  Laterality: N/A;    MEDICATIONS AT HOME: Prior to Admission medications   Medication Sig Start Date End Date Taking? Authorizing Provider  Artificial Tear Ointment (DRY EYES OP) Place 1 drop into both eyes daily as needed (dry eyes).    Yes [provider]  aspirin EC 81 MG EC tablet Take 1 tablet (81 mg total) by mouth daily. 08/11/15  Yes Rinaldo Cloud, MD  atorvastatin (LIPITOR) 80  MG tablet Take 1 tablet (80 mg total) by mouth daily at 6 PM. 08/11/15  Yes Rinaldo Cloud, MD  cetirizine (ZYRTEC) 10 MG tablet Take 10 mg by mouth daily as needed for allergies.   Yes [provider]  clopidogrel (PLAVIX) 75 MG tablet Take 75 mg by  mouth daily.   Yes [provider]  metoprolol tartrate (LOPRESSOR) 25 MG tablet Take 0.5 tablets (12.5 mg total) by mouth 2 (two) times daily. Patient taking differently: Take 12.5 mg by mouth daily.  08/11/15  Yes Rinaldo Cloud, MD  ondansetron (ZOFRAN) 8 MG tablet Take 8 mg by mouth every 8 (eight) hours as needed for nausea/vomiting. 03/17/18  Yes [provider]  pantoprazole (PROTONIX) 40 MG tablet Take 1 tablet (40 mg total) by mouth daily at 6 (six) AM. 08/11/15  Yes Rinaldo Cloud, MD  venlafaxine (EFFEXOR) 37.5 MG tablet Take 1 tablet by mouth daily. 01/28/17  Yes [provider]  nitroGLYCERIN (NITROSTAT) 0.4 MG SL tablet Place 1 tablet (0.4 mg total) under the tongue every 5 (five) minutes as needed for chest pain (CP or SOB). 08/11/15   Rinaldo Cloud, MD    FAMILY HISTORY: No family history of renal disease.   SOCIAL HISTORY:  reports that she has never smoked. She has never used smokeless tobacco. She reports that she does not drink alcohol or use drugs.  PHYSICAL EXAM: Blood pressure (!) 156/56, pulse (!) 57, temperature 97.8 F (36.6 C), temperature source Oral, resp. rate 16, height 5\' 7"  (1.702 m), weight 63.5 kg, SpO2 98 %.  General appearance :Awake, minimally confused-answers some questions appropriately.  Speech clear.  Nonacute appearing.   Eyes:Pink conjunctiva HEENT: Atraumatic and Normocephalic Neck: supple, Resp:Good air entry bilaterally, no added sounds  CVS: S1 S2 regular, no murmurs.  GI: Bowel sounds present, Non tender and not distended with no gaurding, rigidity or rebound.No organomegaly Extremities: B/L Lower Ext shows no edema, both legs are warm to touch Neurology:  speech clear,Non focal, sensation is grossly intact. Musculoskeletal:gait appears to be normal.No digital cyanosis Skin:No Rash, warm and dry Wounds:N/A  LABS ON ADMISSION:  I have personally reviewed following labs and imaging studies  CBC: Recent Labs    Lab Apr 09, 2018 1132 04/09/2018 1330  WBC 9.6  --   HGB 14.1 15.0  HCT 42.5 44.0  MCV 88.5  --   PLT 239  --     Basic Metabolic Panel: Recent Labs  Lab 04-09-18 1132 04/09/2018 1330  NA 139 140  K 3.2* 2.7*  CL 97* 95*  CO2 31  --   GLUCOSE 132* 123*  BUN <5* 5*  CREATININE 0.87 0.70  CALCIUM 9.7  --     GFR: Estimated Creatinine Clearance: 57.3 mL/min (by C-G formula based on SCr of 0.7 mg/dL).  Liver Function Tests: Recent Labs  Lab 04-09-18 1132  AST 22  ALT 22  ALKPHOS 94  BILITOT 1.4*  PROT 6.9  ALBUMIN 3.9   Recent Labs  Lab 04/09/2018 1132  LIPASE 256*   No results for input(s): AMMONIA in the last 168 hours.  Coagulation Profile: No results for input(s): INR, PROTIME in the last 168 hours.  Cardiac Enzymes: Recent Labs  Lab 2018-04-09 1319  TROPONINI <0.03    BNP (last 3 results) No results for input(s): PROBNP in the last 8760 hours.  HbA1C: No results for input(s): HGBA1C in the last 72 hours.  CBG: No results for input(s): GLUCAP in the last 168 hours.  Lipid  Profile: No results for input(s): CHOL, HDL, LDLCALC, TRIG, CHOLHDL, LDLDIRECT in the last 72 hours.  Thyroid Function Tests: No results for input(s): TSH, T4TOTAL, FREET4, T3FREE, THYROIDAB in the last 72 hours.  Anemia Panel: No results for input(s): VITAMINB12, FOLATE, FERRITIN, TIBC, IRON, RETICCTPCT in the last 72 hours.  Urine analysis:    Component Value Date/Time   COLORURINE YELLOW 03/21/2018 1346   APPEARANCEUR CLOUDY (A) 03/21/2018 1346   LABSPEC 1.009 03/21/2018 1346   PHURINE 6.0 03/21/2018 1346   GLUCOSEU NEGATIVE 03/21/2018 1346   HGBUR MODERATE (A) 03/21/2018 1346   BILIRUBINUR NEGATIVE 03/21/2018 1346   KETONESUR NEGATIVE 03/21/2018 1346   PROTEINUR NEGATIVE 03/21/2018 1346   NITRITE POSITIVE (A) 03/21/2018 1346   LEUKOCYTESUR LARGE (A) 03/21/2018 1346    Sepsis Labs: Lactic Acid, Venous    Component Value Date/Time   LATICACIDVEN 1.21 03/21/2018  1337     Microbiology: No results found for this or any previous visit (from the past 240 hour(s)).    RADIOLOGIC STUDIES ON ADMISSION: Ct Abdomen Pelvis W Contrast  Result Date: 03/21/2018 CLINICAL DATA:  77 year old with weakness following vomiting and diarrhea. Abdominal distension. EXAM: CT ABDOMEN AND PELVIS WITH CONTRAST TECHNIQUE: Multidetector CT imaging of the abdomen and pelvis was performed using the standard protocol following bolus administration of intravenous contrast. CONTRAST:  100mL ISOVUE-300 IOPAMIDOL (ISOVUE-300) INJECTION 61% COMPARISON:  None. FINDINGS: Lower chest: Lung bases are clear. Evidence for coronary artery calcifications. Hepatobiliary: Normal appearance of the liver, gallbladder and portal venous system. No biliary dilatation. Pancreas: Unremarkable. No pancreatic ductal dilatation or surrounding inflammatory changes. Spleen: Normal in size without focal abnormality. Adrenals/Urinary Tract: Normal adrenal glands. Small amount of gas in the urinary bladder. Normal appearance of both kidneys without hydronephrosis. Stomach/Bowel: Small hiatal hernia. Normal appearance of the duodenum. Normal appearance of the small and large bowel without acute inflammation or obstruction. Appendix is not confidently identified but no inflammatory changes in this area. Vascular/Lymphatic: Atherosclerotic disease involving the abdominal aorta. 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 This structure measures roughly 7 mm and could represent a thrombosed or partially thrombosed saccular aneurysm or old penetrating ulcer. Main visceral arteries are patent. Atherosclerotic disease involving the SMA approximately 3 cm from the origin and causing at least 50% narrowing in this region. Bilateral renal arteries are patent without significant stenosis. Inferior mesenteric artery  is patent. No lymph node enlargement in the abdomen or pelvis. Reproductive: Uterus and bilateral adnexa are unremarkable. Other: Negative for free fluid.  Negative for free air. Musculoskeletal: No acute bone abnormality. IMPRESSION: Small amount of gas in the urinary bladder. Findings could be related to recent catheterization and recommend clinical correlation with regards to a urinary tract infection. No significant bladder wall thickening and no inflammatory changes involving the kidneys or urinary bladder. Aortic Atherosclerosis (ICD10-I70.0). Irregularity of the infrarenal abdominal aorta without aneurysm. 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 This is probably thrombosed or partially thrombosed. In addition, there is atherosclerotic disease involving the distal aspect of the main SMA. Although there are atherosclerotic calcifications involving the visceral arteries, the distribution is not suggestive for a chronic mesenteric ischemia pattern. The abdominal aorta and small saccular aneurysm could be better characterized with a CTA of the abdomen and pelvis. No inflammatory changes in this area. No evidence for an aortic rupture. Small hiatal  hernia. Electronically Signed   By: Richarda Overlie M.D.   On: 03/21/2018 14:53    EKG:  Personally reviewed-sinus rhythm  ASSESSMENT AND PLAN: Emphysematous cystitis: UA positive for UTI, air in the urinary bladder on CT scan (no history of recent catheterization)-does acknowledge some symptoms of dysuria and frequency-start Rocephin, follow cultures.  Due to nausea/intermittent vomiting- will not be able to tolerate oral antibiotic therapy. Follow urine culture (have ordered stat-but already on Rocephin in the ED-hence may be sterile)  Vomiting: Etiology unclear-not sure if this is related to above-her lipase is also somewhat elevated-however she does not have any abdominal pain, CT of the abdomen does  not show any evidence of pancreatitis as well.  Will start full liquids and slowly advance-continue as needed antiemetics, gentle hydration with IVF.  Hypokalemia: Secondary to vomiting-replete and recheck.  Generalized weakness weakness: Secondary to dehydration, hypokalemia-UTI-we will need PT evaluation.  Patient has a nonfocal exam.  Hypertension: Continue with metoprolol.  CAD: No anginal symptoms-continue with aspirin, Plavix, metoprolol and statin.  Dementia: Suspect this is mild-Per family-patient still drives-and is independent with activities of daily living.  She appears to be mildly but pleasantly confused during my exam.  Further plan will depend as patient's clinical course evolves and further radiologic and laboratory data become available. Patient will be monitored closely.  Above noted plan was discussed with patient/family  face to face at bedside, they were in agreement.   CONSULTS: None   DVT Prophylaxis: Prophylactic Lovenox   Code Status: Full Code  Disposition Plan: Home in 2-3 days.  May require home health services.  Admission status: Inpatient going to medical floor  The medical decision making on this patient was of high complexity and the patient is at high risk for clinical deterioration, therefore this is a level 3 visit.  Total time spent  55 minutes.Greater than 50% of this time was spent in counseling, explanation of diagnosis, planning of further management, and coordination of care.  Jeoffrey Massed [facility] Pager [phone]  If 7PM-7AM, please contact night-coverage [email or web address] Password [facility] 03/21/2018, 4:05 PM

## 2018-03-21 NOTE — ED Provider Notes (Signed)
[facility] EMERGENCY DEPARTMENT Provider Note   CSN: [id number] Arrival date & time: 03/21/18  1057     History   Chief Complaint Chief Complaint  Patient presents with  . nausea/vomiting/weakness    HPI Tiffany Mack is a 77 y.o. female.  77 year old female presents with 3 days of vomiting which is not been bilious as well as diarrhea which is since improved.  Seen by her physician and given a course of antiemetics but continues to complain of abdominal discomfort mostly at her epigastric region.  No cardiac complaints at this time.  Symptoms persistent nothing makes it worse and denies any urinary symptoms.     Past Medical History:  Diagnosis Date  . Anxiety   . Coronary artery disease   . Dementia     Patient Active Problem List   Diagnosis Date Noted  . Acute non Q wave MI (myocardial infarction), initial episode of care [facility]) 08/04/2015    Past Surgical History:  Procedure Laterality Date  . CARDIAC CATHETERIZATION N/A 08/04/2015   Procedure: Left Heart Cath and Coronary Angiography;  Surgeon: Rinaldo Cloud, MD;  Location: [facility] INVASIVE CV LAB;  Service: Cardiovascular;  Laterality: N/A;  . CARDIAC CATHETERIZATION N/A 08/08/2015   Procedure: Coronary Stent Intervention;  Surgeon: Rinaldo Cloud, MD;  Location: [facility] INVASIVE CV LAB;  Service: Cardiovascular;  Laterality: N/A;     OB History   None      Home Medications    Prior to Admission medications   Medication Sig Start Date End Date Taking? Authorizing Provider  acetaminophen (TYLENOL) 500 MG tablet Take 500 mg by mouth every 6 (six) hours as needed for headache.    [provider]  Artificial Tear Ointment (DRY EYES OP) Apply 1 drop to eye daily as needed (dry eyes).    [provider]  aspirin EC 81 MG EC tablet Take 1 tablet (81 mg total) by mouth daily. 08/11/15   Rinaldo Cloud, MD  atorvastatin (LIPITOR) 80 MG tablet Take 1 tablet (80 mg total) by mouth daily  at 6 PM. 08/11/15   Rinaldo Cloud, MD  bisacodyl (DULCOLAX) 5 MG EC tablet Take 1 tablet (5 mg total) by mouth daily as needed for moderate constipation. 08/11/15   Rinaldo Cloud, MD  clopidogrel (PLAVIX) 75 MG tablet Take 75 mg by mouth daily.    [provider]  donepezil (ARICEPT) 10 MG tablet Take 1/2 tablet daily for 2 weeks, then increase to 1 tablet daily 02/24/18   Van Clines, MD  ferrous sulfate 325 (65 FE) MG tablet Take 1 tablet (325 mg total) by mouth 3 (three) times daily with meals. 08/11/15   Rinaldo Cloud, MD  Menthol, Topical Analgesic, (BIOFREEZE ROLL-ON EX) Apply 1 application topically daily as needed (pain).    [provider]  metoprolol tartrate (LOPRESSOR) 25 MG tablet Take 0.5 tablets (12.5 mg total) by mouth 2 (two) times daily. Patient taking differently: Take 25 mg by mouth daily.  08/11/15   Rinaldo Cloud, MD  Multiple Vitamin (MULTIVITAMIN WITH MINERALS) TABS tablet Take 1 tablet by mouth 3 (three) times daily.    [provider]  nitroGLYCERIN (NITROSTAT) 0.4 MG SL tablet Place 1 tablet (0.4 mg total) under the tongue every 5 (five) minutes as needed for chest pain (CP or SOB). 08/11/15   Rinaldo Cloud, MD  pantoprazole (PROTONIX) 40 MG tablet Take 1 tablet (40 mg total) by mouth daily at 6 (six) AM. 08/11/15   Rinaldo Cloud, MD  ticagrelor (BRILINTA) 90 MG TABS tablet Take 1 tablet (90 mg total) by mouth 2 (two) times daily. 08/11/15   [name], Mohan, MD  venlafaxine (EFFEXOR) 37.5 MG tablet Take 1 tablet by mouth daily. 01/28/17   [provider]    Family History No family history on file.  Social History Social History   Tobacco Use  . Smoking status: Never Smoker  . Smokeless tobacco: Never Used  Substance Use Topics  . Alcohol use: No  . Drug use: No     Allergies   Patient has no known allergies.   Review of Systems Review of Systems  All other systems reviewed and are negative.    Physical  Exam Updated Vital Signs BP (!) 157/51 (BP Location: Left Arm)   Pulse 62   Temp 97.8 F (36.6 C) (Oral)   Resp 18   Ht 1.702 m (5\' 7" )   Wt 63.5 kg   SpO2 100%   BMI 21.93 kg/m   Physical Exam  Constitutional: She is oriented to person, place, and time. She appears well-developed and well-nourished.  Non-toxic appearance. No distress.  HENT:  Head: Normocephalic and atraumatic.  Eyes: Pupils are equal, round, and reactive to light. Conjunctivae, EOM and lids are normal.  Neck: Normal range of motion. Neck supple. No tracheal deviation present. No thyroid mass present.  Cardiovascular: Normal rate, regular rhythm and normal heart sounds. Exam reveals no gallop.  No murmur heard. Pulmonary/Chest: Effort normal and breath sounds normal. No stridor. No respiratory distress. She has no decreased breath sounds. She has no wheezes. She has no rhonchi. She has no rales.  Abdominal: Soft. Normal appearance and bowel sounds are normal. She exhibits no distension. There is tenderness in the epigastric area. There is no rigidity, no rebound, no guarding and no CVA tenderness.    Musculoskeletal: Normal range of motion. She exhibits no edema or tenderness.  Neurological: She is alert and oriented to person, place, and time. She has normal strength. No cranial nerve deficit or sensory deficit. GCS eye subscore is 4. GCS verbal subscore is 5. GCS motor subscore is 6.  Skin: Skin is warm and dry. No abrasion and no rash noted.  Psychiatric: She has a normal mood and affect. Her speech is normal and behavior is normal.  Nursing note and vitals reviewed.    ED Treatments / Results  Labs (all labs ordered are listed, but only abnormal results are displayed) Labs Reviewed  LIPASE, BLOOD - Abnormal; Notable for the following components:      Result Value   Lipase 256 (*)    All other components within normal limits  COMPREHENSIVE METABOLIC PANEL - Abnormal; Notable for the following components:    Potassium 3.2 (*)    Chloride 97 (*)    Glucose, Bld 132 (*)    BUN <5 (*)    Total Bilirubin 1.4 (*)    All other components within normal limits  CBC  URINALYSIS, ROUTINE W REFLEX MICROSCOPIC  TROPONIN I  I-STAT CG4 LACTIC ACID, ED    EKG None  Radiology No results found.  Procedures Procedures (including critical care time)  Medications Ordered in ED Medications  sodium chloride 0.9 % bolus 500 mL (has no administration in time range)  0.9 %  sodium chloride infusion (has no administration in time range)  morphine 4 MG/ML injection 4 mg (has no administration in time range)     Initial Impression / Assessment and Plan / ED Course  I  have reviewed the triage vital signs and the nursing notes.  Pertinent labs & imaging results that were available during my care of the patient were reviewed by me and considered in my medical decision making (see chart for details).     Start IV antibiotics for her UTI.  Abdominal CT was performed due to elevated lipase which did not show any acute pancreatitis.  Will be admitted to the hospitalist service  Final Clinical Impressions(s) / ED Diagnoses   Final diagnoses:  None    ED Discharge Orders    None       Lorre Nick, MD 03/21/18 1528

## 2018-03-21 NOTE — Progress Notes (Signed)
Tiffany HomesGayle Mack [id number] Admission Data: 03/21/2018 4:56 PM Attending Provider: Maretta BeesGhimire, Shanker M, MD  FIE:PPIRJJOPCP:Wolters, Jasmine DecemberSharon, MD Consults/ Treatment Team:   [name] Mack is a 77 y.o. female patient admitted from ED awake, alert  & orientated  X 3,  Full Code, VSS - Blood pressure (!) 166/51, pulse (!) 56, temperature (!) 97.4 F (36.3 C), temperature source Oral, resp. rate 16, height 5\' 7"  (1.702 m), weight 63.5 kg, SpO2 100 %., on RA, no c/o shortness of breath, no c/o chest pain, no distress noted. Patient does not have telemetry ordered.    IV site WDL:  forearm left, condition patent and no redness with a transparent dsg that's clean dry and intact.  Allergies:  No Known Allergies   Past Medical History:  Diagnosis Date  . Anxiety   . Coronary artery disease   . Dementia     History:  obtained from chart review and patient's son. Tobacco/alcohol: denied none  Pt orientation to unit, room and routine. Information packet given to patient/family and safety video watched.  Admission INP armband ID verified with patient/family, and in place. SR up x 2, fall risk assessment complete with Patient and family verbalizing understanding of risks associated with falls. Pt verbalizes an understanding of how to use the call bell and to call for help before getting out of bed.  Skin, clean-dry- intact without evidence of bruising, or skin tears.   No evidence of skin break down noted on exam with Midge AverVicki Smith, RN.   Will continue to monitor and assist as needed.  Lyndal Pulleyassidy L Alizza Sacra, RN 03/21/2018 4:56 PM

## 2018-03-22 ENCOUNTER — Other Ambulatory Visit: Payer: Self-pay

## 2018-03-22 DIAGNOSIS — R1111 Vomiting without nausea: Secondary | ICD-10-CM

## 2018-03-22 LAB — BASIC METABOLIC PANEL
Anion gap: 7 (ref 5–15)
BUN: <5 mg/dL — ABNORMAL LOW (ref 8–23)
CO2: 32 mmol/L (ref 22–32)
Calcium: 9.1 mg/dL (ref 8.9–10.3)
Chloride: 103 mmol/L (ref 98–111)
Creatinine, Ser: 0.62 mg/dL (ref 0.44–1.00)
GFR calc Af Amer: >60 mL/min (ref >60)
GFR calc non Af Amer: >60 mL/min (ref >60)
Glucose, Bld: 107 mg/dL — ABNORMAL HIGH (ref 70–99)
Potassium: 4.2 mmol/L (ref 3.5–5.1)
Sodium: 142 mmol/L (ref 135–145)

## 2018-03-22 LAB — CBC
HCT: 40 % (ref 36.0–46.0)
Hemoglobin: 13 g/dL (ref 12.0–15.0)
MCH: 28.8 pg (ref 26.0–34.0)
MCHC: 32.5 g/dL (ref 30.0–36.0)
MCV: 88.5 fL (ref 78.0–100.0)
Platelets: 193 10*3/uL (ref 150–400)
RBC: 4.52 MIL/uL (ref 3.87–5.11)
RDW: 12.2 % (ref 11.5–15.5)
WBC: 9.6 10*3/uL (ref 4.0–10.5)

## 2018-03-22 MED ORDER — ENSURE ENLIVE PO LIQD
237.0000 mL | Freq: Two times a day (BID) | ORAL | Status: DC
  Administered 2018-03-23 – 2018-03-24 (×3): 237 mL via ORAL

## 2018-03-22 NOTE — Progress Notes (Signed)
PROGRESS NOTE  Tiffany Mack ZOX:[id number] DOB: 1941/04/03 DOA: 03/21/2018 PCP: Mila PalmerWolters, Sharon, MD  HPI/Recap of past 24 hours:  Tiffany Mack is a 77 y.o. female with medical history significant of CAD status post PCI 2-3 years back, hypertension, dyslipidemia, mild dementia presents to the hospital for evaluation of the above-noted complaints.  Please note patient seems somewhat confused-hence most of the history is obtained after speaking with the patient's son at bedside.  Apparently this past Monday, patient developed few loose stools that the family describes as diarrhea.  This gradually resolved-however patient then started developing some vomiting, following which patient then started having weakness and decreased appetite.  Family is not aware of fever-but they did notice some tremors-and patient appeared flushed at times.  After repeatedly questioning-patient does acknowledge some frequency of urination and dysuria over the past few days.  She denies any pelvic pain.  She has not had diarrhea for the past couple of days-in fact has not had a bowel movement today.  Patient denies any abdominal pain.  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 Afebrile-stable vital signs.  UA positive for UTI, lipase was elevated-CT scan positive for air in the urinary bladder.  Patient also found to have hyperkalemia.  Started on IV fluids, potassium supplementation and IV Rocephin.   Subjective.:  Patient seen at bedside she denies any fever she has not had much appetite had been on clear liquids she would like to advance to regular diet to see if she can tolerate that.  We will advance her diet to full diet  Assessment/Plan: Active Problems:   Emphysematous cystitis   Vomiting   HTN (hypertension)   Dementia   CAD (coronary artery disease)  Emphysematous cystitis: UA  positive for UTI, air in the urinary bladder on CT scan (no history of recent catheterization)-does acknowledge some symptoms of dysuria and frequency-start Rocephin, follow cultures.  Due to nausea/intermittent vomiting- will not be able to tolerate oral antibiotic therapy. Follow urine culture (have ordered stat-but already on Rocephin in the ED-hence may be sterile)  Vomiting: Etiology unclear-not sure if this is related to above-her lipase is also somewhat elevated-however she does not have any abdominal pain, CT of the abdomen does not show any evidence of pancreatitis as well.  Will start full liquids and slowly advance-continue as needed antiemetics, gentle hydration with IVF.  Hypokalemia: Secondary to vomiting-replete and recheck.  Generalized weakness weakness: Secondary to dehydration, hypokalemia-UTI-we will need PT evaluation.  Patient has a nonfocal exam.  Hypertension: Continue with metoprolol.  CAD: No anginal symptoms-continue with aspirin, Plavix, metoprolol and statin.  Dementia: Suspect this is mild-Per family-patient still drives-and is independent with activities of daily living.  She appears to be mildly but pleasantly confused during my exam.  Family Communication: Above noted plan was discussed with patient/family  face to face at bedside, they were in agreement.   CONSULTS: None   DVT Prophylaxis: Prophylactic Lovenox   Code Status: Full Code  Disposition Plan: Home in 2-3 days.  May require home health services.  Admission status: Inpatient  Procedures:  none  Antimicrobials: IV Rocephin.    Objective: Vitals:   03/22/18 0508 03/22/18 1027  BP: (!) 136/58 (!) 139/53  Pulse: (!) 59 62  Resp: 17   Temp: 98.6 F (37 C)   SpO2: 97%     Intake/Output Summary (Last 24 hours) at 03/22/2018 1147 Last data filed at 03/22/2018  0900 Gross per 24 hour  Intake 1049.78 ml  Output 1850 ml  Net -800.22 ml   Filed Weights   03/21/18 1126  03/22/18 0452  Weight: 63.5 kg 61.1 kg   Body mass index is 21.1 kg/m.  Exam:  General appearance :Awake, minimally confused-answers some questions appropriately.  Speech clear.  Nonacute appearing.   Eyes:Pink conjunctiva HEENT: Atraumatic and Normocephalic Neck: supple, Resp:Good air entry bilaterally, no added sounds  CVS: S1 S2 regular, no murmurs.  GI: Bowel sounds present, Non tender and not distended with no gaurding, rigidity or rebound.No organomegaly Extremities: B/L Lower Ext shows no edema, both legs are warm to touch Neurology:  speech clear,Non focal, sensation is grossly intact. Musculoskeletal:gait appears to be normal.No digital cyanosis Skin:No Rash, warm and dry Wounds:N/A Psychiatry: Behavior is appropriate   Data Reviewed: CBC: Recent Labs  Lab 03/21/18 1132 03/21/18 1330 03/22/18 0430  WBC 9.6  --  9.6  HGB 14.1 15.0 13.0  HCT 42.5 44.0 40.0  MCV 88.5  --  88.5  PLT 239  --  193   Basic Metabolic Panel: Recent Labs  Lab 03/21/18 1132 03/21/18 1330 03/22/18 0430  NA 139 140 142  K 3.2* 2.7* 4.2  CL 97* 95* 103  CO2 31  --  32  GLUCOSE 132* 123* 107*  BUN <5* 5* <5*  CREATININE 0.87 0.70 0.62  CALCIUM 9.7  --  9.1   GFR: Estimated Creatinine Clearance: 56.8 mL/min (by C-G formula based on SCr of 0.62 mg/dL). Liver Function Tests: Recent Labs  Lab 03/21/18 1132  AST 22  ALT 22  ALKPHOS 94  BILITOT 1.4*  PROT 6.9  ALBUMIN 3.9   Recent Labs  Lab 03/21/18 1132  LIPASE 256*   No results for input(s): AMMONIA in the last 168 hours. Coagulation Profile: No results for input(s): INR, PROTIME in the last 168 hours. Cardiac Enzymes: Recent Labs  Lab 03/21/18 1319  TROPONINI <0.03   BNP (last 3 results) No results for input(s): PROBNP in the last 8760 hours. HbA1C: No results for input(s): HGBA1C in the last 72 hours. CBG: No results for input(s): GLUCAP in the last 168 hours. Lipid Profile: No results for input(s): CHOL,  HDL, LDLCALC, TRIG, CHOLHDL, LDLDIRECT in the last 72 hours. Thyroid Function Tests: No results for input(s): TSH, T4TOTAL, FREET4, T3FREE, THYROIDAB in the last 72 hours. Anemia Panel: No results for input(s): VITAMINB12, FOLATE, FERRITIN, TIBC, IRON, RETICCTPCT in the last 72 hours. Urine analysis:    Component Value Date/Time   COLORURINE YELLOW 03/21/2018 1346   APPEARANCEUR CLOUDY (A) 03/21/2018 1346   LABSPEC 1.009 03/21/2018 1346   PHURINE 6.0 03/21/2018 1346   GLUCOSEU NEGATIVE 03/21/2018 1346   HGBUR MODERATE (A) 03/21/2018 1346   BILIRUBINUR NEGATIVE 03/21/2018 1346   KETONESUR NEGATIVE 03/21/2018 1346   PROTEINUR NEGATIVE 03/21/2018 1346   NITRITE POSITIVE (A) 03/21/2018 1346   LEUKOCYTESUR LARGE (A) 03/21/2018 1346   Sepsis Labs: @LABRCNTIP (procalcitonin:4,lacticidven:4)  ) Recent Results (from the past 240 hour(s))  Culture, Urine     Status: Abnormal (Preliminary result)   Collection Time: 03/21/18  4:41 PM  Result Value Ref Range Status   Specimen Description URINE, RANDOM  Final   Special Requests   Final    NONE Performed at [facility], [address], [place], [place] [address]    Culture >=100,000 COLONIES/mL GRAM NEGATIVE RODS (A)  Final   Report Status PENDING  Incomplete      Studies: Ct  Abdomen Pelvis W Contrast  Result Date: 03/21/2018 CLINICAL DATA:  77 year old with weakness following vomiting and diarrhea. Abdominal distension. EXAM: CT ABDOMEN AND PELVIS WITH CONTRAST TECHNIQUE: Multidetector CT imaging of the abdomen and pelvis was performed using the standard protocol following bolus administration of intravenous contrast. CONTRAST:  ISOVUE-300 IOPAMIDOL (ISOVUE-300) INJECTION 61% COMPARISON:  None. FINDINGS: Lower chest: Lung bases are clear. Evidence for coronary artery calcifications. Hepatobiliary: Normal appearance of the liver, gallbladder and portal venous system. No biliary dilatation. Pancreas: Unremarkable. No pancreatic  ductal dilatation or surrounding inflammatory changes. Spleen: Normal in size without focal abnormality. Adrenals/Urinary Tract: Normal adrenal glands. Small amount of gas in the urinary bladder. Normal appearance of both kidneys without hydronephrosis. Stomach/Bowel: Small hiatal hernia. Normal appearance of the duodenum. Normal appearance of the small and large bowel without acute inflammation or obstruction. Appendix is not confidently identified but no inflammatory changes in this area. Vascular/Lymphatic: Atherosclerotic disease involving the abdominal aorta. 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 This structure measures roughly 7 mm and could represent a thrombosed or partially thrombosed saccular aneurysm or old penetrating ulcer. Main visceral arteries are patent. Atherosclerotic disease involving the SMA approximately 3 cm from the origin and causing at least 50% narrowing in this region. Bilateral renal arteries are patent without significant stenosis. Inferior mesenteric artery is patent. No lymph node enlargement in the abdomen or pelvis. Reproductive: Uterus and bilateral adnexa are unremarkable. Other: Negative for free fluid.  Negative for free air. Musculoskeletal: No acute bone abnormality. IMPRESSION: Small amount of gas in the urinary bladder. Findings could be related to recent catheterization and recommend clinical correlation with regards to a urinary tract infection. No significant bladder wall thickening and no inflammatory changes involving the kidneys or urinary bladder. Aortic Atherosclerosis (ICD10-I70.0). Irregularity of the infrarenal abdominal aorta without aneurysm. 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 This is probably thrombosed or partially thrombosed. In addition, there is atherosclerotic  disease involving the distal aspect of the main SMA. Although there are atherosclerotic calcifications involving the visceral arteries, the distribution is not suggestive for a chronic mesenteric ischemia pattern. The abdominal aorta and small saccular aneurysm could be better characterized with a CTA of the abdomen and pelvis. No inflammatory changes in this area. No evidence for an aortic rupture. Small hiatal hernia. Electronically Signed   By: Richarda Overlie M.D.   On: 03/21/2018 14:53    Scheduled Meds: . aspirin EC  81 mg Oral Daily  . atorvastatin  80 mg Oral q1800  . enoxaparin (LOVENOX) injection  40 mg Subcutaneous Q24H  . loratadine  10 mg Oral Daily  . metoprolol tartrate  12.5 mg Oral BID  . pantoprazole  40 mg Oral Q0600  . venlafaxine XR  37.5 mg Oral Daily    Continuous Infusions: . 0.9 % NaCl with KCl 20 mEq / L 75 mL/hr at 03/22/18 0736  . cefTRIAXone (ROCEPHIN)  IV       LOS: 1 day     Myrtie Neither, MD [facility]  To reach me or the doctor on call, go to: [email or web address] Password [facility]  03/22/2018, 11:47 AM

## 2018-03-22 NOTE — Evaluation (Signed)
Physical Therapy Evaluation Patient Details Name: Tiffany Mack MRN: [id number] DOB: 07-02-1941 Today's Date: 03/22/2018   History of Present Illness     77 y.o. female with medical history significant of CAD status post PCI 2-3 years back, hypertension, dyslipidemia, mild dementia presents to the hospital for evaluation of weakness, nausea, vomiting for the past 2-3 days, and diarrhea (resolved) for 2 days approximately 5 days back.     Clinical Impression  Pt presents at/near baseline functional level, able to change positions and move around with no physical assist.  Son and his girlfriend live with and care for pt who has mild short term memory problems due to mild dementia.  No balance or gait deficits and no PT needs identified.  Recommend home at d/c with no follow up planned.    Follow Up Recommendations No PT follow up    Equipment Recommendations  None recommended by PT    Recommendations for Other Services       Precautions / Restrictions Precautions Precautions: Fall Precaution Comments: up with S Restrictions Weight Bearing Restrictions: No      Mobility  Bed Mobility Overal bed mobility: Independent                Transfers Overall transfer level: Independent Equipment used: None                Ambulation/Gait Ambulation/Gait assistance: Modified independent (Device/Increase time) Gait Distance (Feet): 300 Feet Assistive device: None Gait Pattern/deviations: Step-through pattern     General Gait Details: no problems or issues noted, cardiovasc endurance intact and no balance or instabilty noted; pt denies worry/concern about falling  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 Patients Only)       Balance Overall balance assessment: Needs assistance Sitting-balance support: No upper extremity supported;Feet supported Sitting balance-Leahy Scale: Normal     Standing balance support: No upper  extremity supported;During functional activity Standing balance-Leahy Scale: Good Standing balance comment: no obvious issues, screened with rhomberg EO/EC >30 sec each          Rhomberg - Eyes Opened: 30 Rhomberg - Eyes Closed: 30(mild incr sway, no loss of balance cues)                 Pertinent Vitals/Pain Pain Assessment: No/denies pain    Home Living Family/patient expects to be discharged to:: Private residence Living Arrangements: Children Available Help at Discharge: Family;Available 24 hours/day Type of Home: House Home Access: Level entry     Home Layout: One level Home Equipment: None Additional Comments: son and his g'friend live with and take care of pt and home    Prior Function Level of Independence: Needs assistance   Gait / Transfers Assistance Needed: no problems here; mild occ balance but no issues  ADL's / Homemaking Assistance Needed: downstairs laundry, other tasks as needed        Hand Dominance   Dominant Hand: Right    Extremity/Trunk Assessment   Upper Extremity Assessment Upper Extremity Assessment: Overall WFL for tasks assessed    Lower Extremity Assessment Lower Extremity Assessment: Overall WFL for tasks assessed    Cervical / Trunk Assessment Cervical / Trunk Assessment: Normal  Communication   Communication: No difficulties  Cognition Arousal/Alertness: Awake/alert Behavior During Therapy: WFL for tasks assessed/performed Overall Cognitive Status: History of cognitive impairments - at baseline  General Comments: mild dementia with short term memory problems (asks if she can still go dancing then asks again 3 min later)      General Comments General comments (skin integrity, edema, etc.): son at bedside to assist with history; attentive and helpfu;l    Exercises     Assessment/Plan    PT Assessment Patent does not need any further PT services  PT Problem List          PT Treatment Interventions      PT Goals (Current goals can be found in the Care Plan section)  Acute Rehab PT Goals Patient Stated Goal: home, go dancing again PT Goal Formulation: All assessment and education complete, DC therapy    Frequency     Barriers to discharge        Co-evaluation               AM-PAC PT "6 Clicks" Daily Activity  Outcome Measure Difficulty turning over in bed (including adjusting bedclothes, sheets and blankets)?: None Difficulty moving from lying on back to sitting on the side of the bed? : None Difficulty sitting down on and standing up from a chair with arms (e.g., wheelchair, bedside commode, etc,.)?: None Help needed moving to and from a bed to chair (including a wheelchair)?: None Help needed walking in hospital room?: None Help needed climbing 3-5 steps with a railing? : None 6 Click Score: 24    End of Session   Activity Tolerance: Patient tolerated treatment well Patient left: in bed;with call bell/phone within reach;with family/visitor present Nurse Communication: Mobility status PT Visit Diagnosis: Muscle weakness (generalized) (M62.81)    Time: [id number] PT Time Calculation (min) (ACUTE ONLY): 34 min   Charges:   PT Evaluation $PT Eval Low Complexity: 1 Low PT Treatments $Therapeutic Exercise: 8-22 mins        Narda AmberJennifer Sonika Levins, PT, DPT, MS Board Certified Geriatric Clinical Specialist   Dennis BastMartin, Keron Koffman Galloway 03/22/2018, 2:20 PM

## 2018-03-23 ENCOUNTER — Other Ambulatory Visit: Payer: Medicare HMO

## 2018-03-23 DIAGNOSIS — I10 Essential (primary) hypertension: Secondary | ICD-10-CM

## 2018-03-23 DIAGNOSIS — I2510 Atherosclerotic heart disease of native coronary artery without angina pectoris: Secondary | ICD-10-CM

## 2018-03-23 DIAGNOSIS — N3080 Other cystitis without hematuria: Principal | ICD-10-CM

## 2018-03-23 LAB — CBC WITH DIFFERENTIAL/PLATELET
Abs Immature Granulocytes: 0 10*3/uL (ref 0.0–0.1)
Basophils Absolute: 0.1 10*3/uL (ref 0.0–0.1)
Basophils Relative: 1 %
Eosinophils Absolute: 0.2 10*3/uL (ref 0.0–0.7)
Eosinophils Relative: 2 %
HCT: 37.9 % (ref 36.0–46.0)
Hemoglobin: 12.2 g/dL (ref 12.0–15.0)
Immature Granulocytes: 0 %
Lymphocytes Relative: 20 %
Lymphs Abs: 1.8 10*3/uL (ref 0.7–4.0)
MCH: 28.8 pg (ref 26.0–34.0)
MCHC: 32.2 g/dL (ref 30.0–36.0)
MCV: 89.4 fL (ref 78.0–100.0)
Monocytes Absolute: 0.7 10*3/uL (ref 0.1–1.0)
Monocytes Relative: 7 %
Neutro Abs: 6.6 10*3/uL (ref 1.7–7.7)
Neutrophils Relative %: 70 %
Platelets: 186 10*3/uL (ref 150–400)
RBC: 4.24 MIL/uL (ref 3.87–5.11)
RDW: 12.3 % (ref 11.5–15.5)
WBC: 9.5 10*3/uL (ref 4.0–10.5)

## 2018-03-23 LAB — BASIC METABOLIC PANEL
Anion gap: 4 — ABNORMAL LOW (ref 5–15)
BUN: 8 mg/dL (ref 8–23)
CO2: 28 mmol/L (ref 22–32)
Calcium: 8.5 mg/dL — ABNORMAL LOW (ref 8.9–10.3)
Chloride: 108 mmol/L (ref 98–111)
Creatinine, Ser: 0.66 mg/dL (ref 0.44–1.00)
GFR calc Af Amer: >60 mL/min (ref >60)
GFR calc non Af Amer: >60 mL/min (ref >60)
Glucose, Bld: 107 mg/dL — ABNORMAL HIGH (ref 70–99)
Potassium: 3.7 mmol/L (ref 3.5–5.1)
Sodium: 140 mmol/L (ref 135–145)

## 2018-03-23 LAB — URINE CULTURE: Culture: >=100000 — AB

## 2018-03-23 MED ORDER — CEPHALEXIN 500 MG PO CAPS
500.0000 mg | ORAL_CAPSULE | Freq: Three times a day (TID) | ORAL | Status: DC
  Administered 2018-03-24: 500 mg via ORAL
  Filled 2018-03-23: qty 1

## 2018-03-23 MED ORDER — POLYETHYLENE GLYCOL 3350 17 G PO PACK
17.0000 g | PACK | Freq: Every day | ORAL | Status: DC
  Administered 2018-03-23 – 2018-03-24 (×2): 17 g via ORAL
  Filled 2018-03-23 (×2): qty 1

## 2018-03-23 MED ORDER — CLOPIDOGREL BISULFATE 75 MG PO TABS
75.0000 mg | ORAL_TABLET | Freq: Every day | ORAL | Status: DC
  Administered 2018-03-23 – 2018-03-24 (×2): 75 mg via ORAL
  Filled 2018-03-23 (×2): qty 1

## 2018-03-23 MED ORDER — SODIUM CHLORIDE 0.9 % IV SOLN
1.0000 g | INTRAVENOUS | Status: AC
  Administered 2018-03-23: 1 g via INTRAVENOUS
  Filled 2018-03-23: qty 10

## 2018-03-23 NOTE — Progress Notes (Signed)
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  GOAL:   Patient will meet greater than or equal to 90% of their needs  MONITOR:   PO intake, Supplement acceptance, Weight trends, I & O's, Labs  REASON FOR ASSESSMENT:   Malnutrition Screening Tool    ASSESSMENT:   77 year old female who presented to the ED with N/V and weakness. PMH significant for CAD, dyslipidemia, hypertension, dementia, and anxiety.  RN at bedside providing nursing care at time of visit. No family present at time of visit.  Spoke with pt at bedside. Pt reports she currently has no appetite. Noted lunch meal tray with approximately 25% meal completion.  Pt states that her appetite is normally "good" PTA. Pt reports eating 2 meals daily. Pt shares that she is "not much of a cook anymore." Pt endorses drinking water and soda throughout the day.  Breakfast: scrambled eggs, bacon, toast, and coffee Lunch: "munching" Dinner: out  Pt is agreeable to receiving Ensure Enlive during admission and would prefer the vanilla flavor.  Pt reports her UBW as 140-142 lbs and denies recent weight loss. Pt shares that someone at church told her that she lost weight, but she doesn't think that she has. Per weight history in chart, pt has lost 8.4 lbs over the past month. This is a 5.9% weight loss which is significant for timeframe.  Meal Completion: 100% charted for all meals; however, 25% of lunch tray at bedside at time of visit  Medications reviewed and include: Ensure Enlive BID, 40 mg Protonix  daily, Miralax daily, IV KCl, IV antibiotics  Labs reviewed.  UOP: 1250 ml x 24 hours  NUTRITION - FOCUSED PHYSICAL EXAM:    Most Recent Value  Orbital Region  Moderate depletion  Upper Arm Region  Moderate depletion  Thoracic and Lumbar Region  Mild depletion  Buccal Region  Mild depletion  Temple Region  Mild depletion  Clavicle Bone Region  Moderate depletion  Clavicle and Acromion Bone Region  Moderate depletion  Scapular Bone Region  Unable to assess  Dorsal Hand  Mild depletion  Patellar Region  Moderate depletion  Anterior Thigh Region  Moderate depletion  Posterior Calf Region  Mild depletion  Edema (RD Assessment)  Mild  Hair  Reviewed  Eyes  Reviewed  Mouth  Reviewed  Skin  Reviewed  Nails  Reviewed       Diet Order:   Diet Order            Diet regular Room service appropriate? Yes; Fluid consistency: Thin  Diet effective now              EDUCATION NEEDS:   No education needs have been identified at this time  Skin:  Skin Assessment: Reviewed RN Assessment  Last BM:  PTA  Height:   Ht Readings from Last 1 Encounters:  03/21/18 5\' 7"  (1.702 m)    Weight:   Wt Readings from Last 1 Encounters:  03/22/18 61.1 kg    Ideal Body Weight:  61.36 kg  BMI:  Body mass index is 21.1 kg/m.  Estimated Nutritional Needs:   Kcal:  1500-1700  Protein:  75-90 grams  Fluid:  >/= 1.6 L    Tiffany ReadingKate Jablonski Mckensey Berghuis, MS, RD, LDN Pager: [phone] Weekend/After Hours: [phone]

## 2018-03-23 NOTE — Progress Notes (Signed)
[facility] PROGRESS NOTE  Tiffany Mack ZOX:[id number] DOB: [birth year] DOA: 03/21/2018  PCP: Mila Palmer, MD  Brief History/Interval Summary: 77 y.o.femalewith medical history significant ofCAD status post PCI 2-3 years back, hypertension, dyslipidemia, mild dementia presented to the hospital for evaluation of nausea vomiting and diarrhea.  Because of worsening weakness, poor appetite, intermittent nausea-patient was brought to the emergency room, for further work-up which revealed UTI and concern for emphysematous cystitis.    Reason for Visit: UTI  Consultants: None  Procedures: None  Antibiotics: Ceftriaxone  Subjective/Interval History: Patient noted to be pleasantly confused.  Her sister and son are at the bedside.  Patient states that she is feeling better compared to before.  Nausea appears to be improving.  No abdominal pain.  ROS: No headaches  Objective:  Vital Signs  Vitals:   03/22/18 1340 03/22/18 1813 03/22/18 2212 03/23/18 0539  BP: (!) 104/44 (!) 120/46 (!) 104/56 (!) 129/51  Pulse: 68 70 72 66  Resp: 18  18 (!) 21  Temp: 97.9 F (36.6 C)  98 F (36.7 C) 98.2 F (36.8 C)  TempSrc:   Oral Oral  SpO2: 100% 98% 95% 98%  Weight:      Height:        Intake/Output Summary (Last 24 hours) at 03/23/2018 1308 Last data filed at 03/23/2018 0900 Gross per 24 hour  Intake 1150 ml  Output 1700 ml  Net -550 ml   Filed Weights   03/21/18 1126 03/22/18 0452  Weight: 63.5 kg 61.1 kg    General appearance: alert, cooperative, appears stated age and no distress Head: Normocephalic, without obvious abnormality, atraumatic Resp: clear to auscultation bilaterally Cardio: regular rate and rhythm, S1, S2 normal, no murmur, click, rub or gallop GI: soft, non-tender; bowel sounds normal; no masses,  no organomegaly Extremities: extremities normal, atraumatic, no cyanosis or edema Neurologic: No focal neurological deficits.  Lab Results:  Data  Reviewed: I have personally reviewed following labs and imaging studies  CBC: Recent Labs  Lab 03/21/18 1132 03/21/18 1330 03/22/18 0430 03/23/18 0453  WBC 9.6  --  9.6 9.5  NEUTROABS  --   --   --  6.6  HGB 14.1 15.0 13.0 12.2  HCT 42.5 44.0 40.0 37.9  MCV 88.5  --  88.5 89.4  PLT 239  --  193 186    Basic Metabolic Panel: Recent Labs  Lab 03/21/18 1132 03/21/18 1330 03/22/18 0430 03/23/18 0453  NA 139 140 142 140  K 3.2* 2.7* 4.2 3.7  CL 97* 95* 103 108  CO2 31  --  32 28  GLUCOSE 132* 123* 107* 107*  BUN <5* 5* <5* 8  CREATININE 0.87 0.70 0.62 0.66  CALCIUM 9.7  --  9.1 8.5*    GFR: Estimated Creatinine Clearance: 56.8 mL/min (by C-G formula based on SCr of 0.66 mg/dL).  Liver Function Tests: Recent Labs  Lab 03/21/18 1132  AST 22  ALT 22  ALKPHOS 94  BILITOT 1.4*  PROT 6.9  ALBUMIN 3.9    Recent Labs  Lab 03/21/18 1132  LIPASE 256*   Cardiac Enzymes: Recent Labs  Lab 03/21/18 1319  TROPONINI <0.03     Recent Results (from the past 240 hour(s))  Culture, Urine     Status: Abnormal   Collection Time: 03/21/18  4:41 PM  Result Value Ref Range Status   Specimen Description URINE, RANDOM  Final   Special Requests   Final    NONE Performed at [facility]  Hospital Lab, [address], [place], [place] [address]    Culture >=100,000 COLONIES/mL ESCHERICHIA COLI (A)  Final   Report Status 03/23/2018 FINAL  Final   Organism ID, Bacteria ESCHERICHIA COLI (A)  Final      Susceptibility   Escherichia coli - MIC*    AMPICILLIN >=32 RESISTANT Resistant     CEFAZOLIN <=4 SENSITIVE Sensitive     CEFTRIAXONE <=1 SENSITIVE Sensitive     CIPROFLOXACIN <=0.25 SENSITIVE Sensitive     GENTAMICIN <=1 SENSITIVE Sensitive     IMIPENEM 1 SENSITIVE Sensitive     NITROFURANTOIN <=16 SENSITIVE Sensitive     TRIMETH/SULFA 40 SENSITIVE Sensitive     AMPICILLIN/SULBACTAM 16 INTERMEDIATE Intermediate     PIP/TAZO <=4 SENSITIVE Sensitive     Extended ESBL NEGATIVE  Sensitive     * >=100,000 COLONIES/mL ESCHERICHIA COLI      Radiology Studies: Ct Abdomen Pelvis W Contrast  Result Date: 03/21/2018 CLINICAL DATA:  77 year old with weakness following vomiting and diarrhea. Abdominal distension. EXAM: CT ABDOMEN AND PELVIS WITH CONTRAST TECHNIQUE: Multidetector CT imaging of the abdomen and pelvis was performed using the standard protocol following bolus administration of intravenous contrast. CONTRAST:  ISOVUE-300 IOPAMIDOL (ISOVUE-300) INJECTION 61% COMPARISON:  None. FINDINGS: Lower chest: Lung bases are clear. Evidence for coronary artery calcifications. Hepatobiliary: Normal appearance of the liver, gallbladder and portal venous system. No biliary dilatation. Pancreas: Unremarkable. No pancreatic ductal dilatation or surrounding inflammatory changes. Spleen: Normal in size without focal abnormality. Adrenals/Urinary Tract: Normal adrenal glands. Small amount of gas in the urinary bladder. Normal appearance of both kidneys without hydronephrosis. Stomach/Bowel: Small hiatal hernia. Normal appearance of the duodenum. Normal appearance of the small and large bowel without acute inflammation or obstruction. Appendix is not confidently identified but no inflammatory changes in this area. Vascular/Lymphatic: Atherosclerotic disease involving the abdominal aorta. 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 This structure measures roughly 7 mm and could represent a thrombosed or partially thrombosed saccular aneurysm or old penetrating ulcer. Main visceral arteries are patent. Atherosclerotic disease involving the SMA approximately 3 cm from the origin and causing at least 50% narrowing in this region. Bilateral renal arteries are patent without significant stenosis. Inferior mesenteric artery is patent. No lymph node enlargement in the abdomen or  pelvis. Reproductive: Uterus and bilateral adnexa are unremarkable. Other: Negative for free fluid.  Negative for free air. Musculoskeletal: No acute bone abnormality. IMPRESSION: Small amount of gas in the urinary bladder. Findings could be related to recent catheterization and recommend clinical correlation with regards to a urinary tract infection. No significant bladder wall thickening and no inflammatory changes involving the kidneys or urinary bladder. Aortic Atherosclerosis (ICD10-I70.0). Irregularity of the infrarenal abdominal aorta without aneurysm. 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 This is probably thrombosed or partially thrombosed. In addition, there is atherosclerotic disease involving the distal aspect of the main SMA. Although there are atherosclerotic calcifications involving the visceral arteries, the distribution is not suggestive for a chronic mesenteric ischemia pattern. The abdominal aorta and small saccular aneurysm could be better characterized with a CTA of the abdomen and pelvis. No inflammatory changes in this area. No evidence for an aortic rupture. Small hiatal hernia. Electronically Signed   By: Richarda Overlie M.D.   On: 03/21/2018 14:53     Medications:  Scheduled: . aspirin  EC  81 mg Oral Daily  . atorvastatin  80 mg Oral q1800  . clopidogrel  75 mg Oral Daily  . enoxaparin (LOVENOX) injection  40 mg Subcutaneous Q24H  . feeding supplement (ENSURE ENLIVE)  237 mL Oral BID BM  . loratadine  10 mg Oral Daily  . metoprolol tartrate  12.5 mg Oral BID  . pantoprazole  40 mg Oral Q0600  . venlafaxine XR  37.5 mg Oral Daily   Continuous: . 0.9 % NaCl with KCl 20 mEq / L 75 mL/hr at 03/23/18 0823  . cefTRIAXone (ROCEPHIN)  IV 1 g (03/22/18 1659)   ONG:[id number]:acetaminophen **OR** acetaminophen, albuterol, nitroGLYCERIN, ondansetron **OR** ondansetron (ZOFRAN) IV  Assessment/Plan:    Emphysematous cystitis UA  was positive for UTI.  Air noted in the urinary bladder.  No recent history of catheterization.  Patient started on ceftriaxone.  Urine culture positive for E. coli.  Sensitivities reviewed.  We will start her on Keflex after today's dose of ceftriaxone.  Nausea and vomiting Possibly related to UTI.  Abdomen remains benign.  CT scan did not show any other concerning findings.  Hypokalemia Secondary to GI loss.  Repleted.  Generalized weakness Possibly secondary to acute illness.  PT evaluation.  Essential hypertension Continue with metoprolol.  Blood pressure is reasonably well controlled.  Coronary artery disease Stable.  Continue with aspirin Plavix metoprolol and statin.  Atherosclerosis also noted in the abdominal aorta on CT scan.  Dementia This is mild according to family.  Pleasantly confused.   DVT Prophylaxis: Lovenox    Code Status: Full code Family Communication: Discussed with the patient and her family Disposition Plan: Management as outlined above.  Change to oral antibiotics later today.  Mobilize.  Possible discharge tomorrow.    LOS: 2 days   Osvaldo ShipperGokul Jelisa   [facility] Pager [phone] 03/23/2018, 1:08 PM  If 7PM-7AM, please contact night-coverage at [email or web address], password [email or web address]

## 2018-03-24 DIAGNOSIS — E440 Moderate protein-calorie malnutrition: Secondary | ICD-10-CM

## 2018-03-24 MED ORDER — POLYETHYLENE GLYCOL 3350 17 G PO PACK: 17.0000 g | PACK | Freq: Every day | ORAL | 0 refills | Status: DC | PRN

## 2018-03-24 MED ORDER — CEPHALEXIN 500 MG PO CAPS: 500.0000 mg | ORAL_CAPSULE | Freq: Three times a day (TID) | ORAL | 0 refills | Status: AC

## 2018-03-24 NOTE — Plan of Care (Signed)

## 2018-03-24 NOTE — Discharge Summary (Signed)
[facility]  Physician Discharge Summary   Patient ID: Tiffany Mack MRN: [id number] DOB/AGE: 77-Oct-1942 77 y.o.  Admit date: 03/21/2018 Discharge date: 03/24/2018  PCP: Mila Palmer, MD  DISCHARGE DIAGNOSES:  Emphysematous cystitis, improving Nausea likely secondary to cystitis, resolved Essential hypertension Coronary artery disease  RECOMMENDATIONS FOR OUTPATIENT FOLLOW UP: 1. Close outpatient follow-up with PCP within 1 week   DISCHARGE CONDITION: fair  Diet recommendation: As before  [facility] Weights   03/21/18 1126 03/22/18 0452  Weight: 63.5 kg 61.1 kg    INITIAL HISTORY: 77 y.o.femalewith medical history significant ofCAD status post PCI 2-3 years back, hypertension, dyslipidemia, mild dementia presented to the hospital for evaluation of nausea vomiting and diarrhea.  Because of worsening weakness, poor appetite, intermittent nausea-patient was brought to the emergency room, for further work-up which revealed UTI and concern for emphysematous cystitis.     HOSPITAL COURSE:   Emphysematous cystitis UA was positive for UTI.  Air noted in the urinary bladder.  No recent history of catheterization and concern for emphysematous cystitis.  Patient started on ceftriaxone.  Urine culture positive for E. coli.  Sensitivities reviewed.    Patient has clinically improved.  She was transitioned to Keflex.  Feels much better this morning.  Discussed with patient and her son.    Nausea and vomiting Possibly related to UTI.  Abdomen remains benign.  CT scan did not show any other concerning findings.  Symptoms have resolved.  Tolerating her diet well.  Hypokalemia Secondary to GI loss.  Repleted.  Generalized weakness Possibly secondary to acute illness.    Seen by physical therapy.  Essential hypertension Continue with metoprolol.  Blood pressure is reasonably well controlled.  Coronary artery disease Stable.  Continue with aspirin Plavix  metoprolol and statin.  Atherosclerosis also noted in the abdominal aorta on CT scan.  Dementia This is mild according to family.  Pleasantly confused.  Overall stable.  Okay for discharge home today.   PERTINENT LABS:  The results of significant diagnostics from this hospitalization (including imaging, microbiology, ancillary and laboratory) are listed below for reference.    Microbiology: Recent Results (from the past 240 hour(s))  Culture, Urine     Status: Abnormal   Collection Time: 03/21/18  4:41 PM  Result Value Ref Range Status   Specimen Description URINE, RANDOM  Final   Special Requests   Final    NONE Performed at [facility], [address] [place], [place] [address]    Culture >=100,000 COLONIES/mL ESCHERICHIA COLI (A)  Final   Report Status 03/23/2018 FINAL  Final   Organism ID, Bacteria ESCHERICHIA COLI (A)  Final      Susceptibility   Escherichia coli - MIC*    AMPICILLIN >=32 RESISTANT Resistant     CEFAZOLIN <=4 SENSITIVE Sensitive     CEFTRIAXONE <=1 SENSITIVE Sensitive     CIPROFLOXACIN <=0.25 SENSITIVE Sensitive     GENTAMICIN <=1 SENSITIVE Sensitive     IMIPENEM 1 SENSITIVE Sensitive     NITROFURANTOIN <=16 SENSITIVE Sensitive     TRIMETH/SULFA 40 SENSITIVE Sensitive     AMPICILLIN/SULBACTAM 16 INTERMEDIATE Intermediate     PIP/TAZO <=4 SENSITIVE Sensitive     Extended ESBL NEGATIVE Sensitive     * >=100,000 COLONIES/mL ESCHERICHIA COLI     Labs: Basic Metabolic Panel: Recent Labs  Lab 03/21/18 1132 03/21/18 1330 03/22/18 0430 03/23/18 0453  NA 139 140 142 140  K 3.2* 2.7* 4.2 3.7  CL 97* 95* 103 108  CO2 31  --  32 28  GLUCOSE 132* 123* 107* 107*  BUN <5* 5* <5* 8  CREATININE 0.87 0.70 0.62 0.66  CALCIUM 9.7  --  9.1 8.5*   Liver Function Tests: Recent Labs  Lab 03/21/18 1132  AST 22  ALT 22  ALKPHOS 94  BILITOT 1.4*  PROT 6.9  ALBUMIN 3.9   Recent Labs  Lab 03/21/18 1132  LIPASE 256*   CBC: Recent Labs  Lab  03/21/18 1132 03/21/18 1330 03/22/18 0430 03/23/18 0453  WBC 9.6  --  9.6 9.5  NEUTROABS  --   --   --  6.6  HGB 14.1 15.0 13.0 12.2  HCT 42.5 44.0 40.0 37.9  MCV 88.5  --  88.5 89.4  PLT 239  --  193 186   Cardiac Enzymes: Recent Labs  Lab 03/21/18 1319  TROPONINI <0.03     IMAGING STUDIES Ct Abdomen Pelvis W Contrast  Result Date: 03/21/2018 CLINICAL DATA:  77 year old with weakness following vomiting and diarrhea. Abdominal distension. EXAM: CT ABDOMEN AND PELVIS WITH CONTRAST TECHNIQUE: Multidetector CT imaging of the abdomen and pelvis was performed using the standard protocol following bolus administration of intravenous contrast. CONTRAST:  100mL ISOVUE-300 IOPAMIDOL (ISOVUE-300) INJECTION 61% COMPARISON:  None. FINDINGS: Lower chest: Lung bases are clear. Evidence for coronary artery calcifications. Hepatobiliary: Normal appearance of the liver, gallbladder and portal venous system. No biliary dilatation. Pancreas: Unremarkable. No pancreatic ductal dilatation or surrounding inflammatory changes. Spleen: Normal in size without focal abnormality. Adrenals/Urinary Tract: Normal adrenal glands. Small amount of gas in the urinary bladder. Normal appearance of both kidneys without hydronephrosis. Stomach/Bowel: Small hiatal hernia. Normal appearance of the duodenum. Normal appearance of the small and large bowel without acute inflammation or obstruction. Appendix is not confidently identified but no inflammatory changes in this area. Vascular/Lymphatic: Atherosclerotic disease involving the abdominal aorta. 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 This structure measures roughly 7 mm and could represent a thrombosed or partially thrombosed saccular aneurysm or old penetrating ulcer. Main visceral arteries are patent. Atherosclerotic disease involving the SMA  approximately 3 cm from the origin and causing at least 50% narrowing in this region. Bilateral renal arteries are patent without significant stenosis. Inferior mesenteric artery is patent. No lymph node enlargement in the abdomen or pelvis. Reproductive: Uterus and bilateral adnexa are unremarkable. Other: Negative for free fluid.  Negative for free air. Musculoskeletal: No acute bone abnormality. IMPRESSION: Small amount of gas in the urinary bladder. Findings could be related to recent catheterization and recommend clinical correlation with regards to a urinary tract infection. No significant bladder wall thickening and no inflammatory changes involving the kidneys or urinary bladder. Aortic Atherosclerosis (ICD10-I70.0). Irregularity of the infrarenal abdominal aorta without aneurysm. 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 This is probably thrombosed or partially thrombosed. In addition, there is atherosclerotic disease involving the distal aspect of the main SMA. Although there are atherosclerotic calcifications involving the visceral arteries, the distribution is not suggestive for a chronic mesenteric ischemia pattern. The abdominal aorta and small saccular aneurysm could be better characterized with a CTA of the abdomen and pelvis. No inflammatory changes in this area. No evidence for an aortic rupture. Small hiatal hernia. Electronically Signed   By: Richarda OverlieAdam  Henn M.D.   On: 03/21/2018 14:53    DISCHARGE EXAMINATION: Vitals:  03/23/18 1430 03/23/18 2213 03/24/18 0525 03/24/18 0938  BP: (!) 116/44 (!) 131/46 (!) 138/47 (!) 119/55  Pulse: 72 68 63 72  Resp: 18  17   Temp: 98.6 F (37 C)  98 F (36.7 C)   TempSrc:   Oral   SpO2: 98%  95%   Weight:      Height:       General appearance: alert, cooperative, appears stated age and no distress Resp: clear to auscultation bilaterally Cardio: regular rate and rhythm, S1, S2 normal, no  murmur, click, rub or gallop GI: soft, non-tender; bowel sounds normal; no masses,  no organomegaly  DISPOSITION: Home  Discharge Instructions    Call MD for:  difficulty breathing, headache or visual disturbances   Complete by:  As directed    Call MD for:  extreme fatigue   Complete by:  As directed    Call MD for:  persistant dizziness or light-headedness   Complete by:  As directed    Call MD for:  persistant nausea and vomiting   Complete by:  As directed    Call MD for:  severe uncontrolled pain   Complete by:  As directed    Call MD for:  temperature >100.4   Complete by:  As directed    Diet - low sodium heart healthy   Complete by:  As directed    Discharge instructions   Complete by:  As directed    Please be sure to follow-up with your primary care provider within 1 week.  You were cared for by a hospitalist during your hospital stay. If you have any questions about your discharge medications or the care you received while you were in the hospital after you are discharged, you can call the unit and asked to speak with the hospitalist on call if the hospitalist that took care of you is not available. Once you are discharged, your primary care physician will handle any further medical issues. Please note that NO REFILLS for any discharge medications will be authorized once you are discharged, as it is imperative that you return to your primary care physician (or establish a relationship with a primary care physician if you do not have one) for your aftercare needs so that they can reassess your need for medications and monitor your lab values. If you do not have a primary care physician, you can call [phone] for a physician referral.   Increase activity slowly   Complete by:  As directed         Allergies as of 03/24/2018   No Known Allergies     Medication List    TAKE these medications   aspirin 81 MG EC tablet Take 1 tablet (81 mg total) by mouth daily.     atorvastatin 80 MG tablet Commonly known as:  LIPITOR Take 1 tablet (80 mg total) by mouth daily at 6 PM.   cephALEXin 500 MG capsule Commonly known as:  KEFLEX Take 1 capsule (500 mg total) by mouth every 8 (eight) hours for 5 days.   cetirizine 10 MG tablet Commonly known as:  ZYRTEC Take 10 mg by mouth daily as needed for allergies.   clopidogrel 75 MG tablet Commonly known as:  PLAVIX Take 75 mg by mouth daily.   DRY EYES OP Place 1 drop into both eyes daily as needed (dry eyes).   metoprolol tartrate 25 MG tablet Commonly known as:  LOPRESSOR Take 0.5 tablets (12.5 mg total) by mouth 2 (  two) times daily. What changed:  when to take this   nitroGLYCERIN 0.4 MG SL tablet Commonly known as:  NITROSTAT Place 1 tablet (0.4 mg total) under the tongue every 5 (five) minutes as needed for chest pain (CP or SOB).   ondansetron 8 MG tablet Commonly known as:  ZOFRAN Take 8 mg by mouth every 8 (eight) hours as needed for nausea/vomiting.   pantoprazole 40 MG tablet Commonly known as:  PROTONIX Take 1 tablet (40 mg total) by mouth daily at 6 (six) AM.   polyethylene glycol packet Commonly known as:  MIRALAX / GLYCOLAX Take 17 g by mouth daily as needed for mild constipation.   venlafaxine 37.5 MG tablet Commonly known as:  EFFEXOR Take 1 tablet by mouth daily.        Follow-up Information    Mila Palmer, MD. Schedule an appointment as soon as possible for a visit in 1 week(s).   Specialty:  Family Medicine Contact information: [address] [place] [address] [phone]           TOTAL DISCHARGE TIME: 35 mins  Osvaldo Shipper  [facility] Pager [phone]  03/24/2018, 1:32 PM

## 2018-03-24 NOTE — Care Management Note (Signed)
Case Management Note  Patient Details  Name: [name] Johnson-Carter MRN: [id number] Date of Birth: 28-Jan-1941  Subjective/Objective:        Emphysematous cystitis.           PCP: Mila PalmerSharon Wolters  Action/Plan: Transition to home. No needs identified per NCM.   Expected Discharge Date:  03/24/18               Expected Discharge Plan:  Home/Self Care  In-House Referral:     Discharge planning Services     Post Acute Care Choice:    Choice offered to:     DME Arranged:    N/A DME Agency:   N/A  HH Arranged:   N/A HH Agency:   N/A  Status of Service:  Completed, signed off  If discussed at Long Length of Stay Meetings, dates discussed:    Additional Comments:  Epifanio LeschesCole, Brenten Janney Hudson, RN 03/24/2018, 11:35 AM

## 2018-03-24 NOTE — Care Management Important Message (Signed)
Important Message  Patient Details  Name: Tiffany Mack MRN: [id number] Date of Birth: 12-Dec-1940   Medicare Important Message Given:  Yes    Michella Detjen Stefan ChurchBratton 03/24/2018, 3:45 PM

## 2018-03-24 NOTE — Discharge Instructions (Signed)

## 2018-03-25 NOTE — Consult Note (Signed)
            [facility] Primary [facility]  03/25/2018  [name] Johnson-Carter August 03, 1940 [id number]   Went to seepatient at the bedside to identify possible discharge needs buthe wasdischarged homeper staff.  Per MD note, patient presentedto the hospital for evaluation of nausea vomiting and diarrhea.She had worsening weakness, poor appetite, intermittent nausea that revealed UTI and concern for emphysematous cystitis. Patient was treated and had clinically improved.  Patient has discharge instruction to follow-up withprimary care provider in 1 week.  Primary care provider's office is listed as providing transition of care (TOC) follow-up.    For additional questions please contact:  Karin GoldenLorraine A. Jadelyn Elks, BSN, RN-BC [facility] PRIMARY CARE Navigator Cell: [phone]

## 2018-03-26 DIAGNOSIS — E46 Unspecified protein-calorie malnutrition: Secondary | ICD-10-CM | POA: Diagnosis not present

## 2018-03-26 DIAGNOSIS — Z79899 Other long term (current) drug therapy: Secondary | ICD-10-CM | POA: Diagnosis not present

## 2018-03-26 DIAGNOSIS — I719 Aortic aneurysm of unspecified site, without rupture: Secondary | ICD-10-CM | POA: Diagnosis not present

## 2018-03-26 DIAGNOSIS — N390 Urinary tract infection, site not specified: Secondary | ICD-10-CM | POA: Diagnosis not present

## 2018-03-31 DIAGNOSIS — R5382 Chronic fatigue, unspecified: Secondary | ICD-10-CM | POA: Diagnosis not present

## 2018-03-31 DIAGNOSIS — R197 Diarrhea, unspecified: Secondary | ICD-10-CM | POA: Diagnosis not present

## 2018-03-31 DIAGNOSIS — R630 Anorexia: Secondary | ICD-10-CM | POA: Diagnosis not present

## 2018-04-03 ENCOUNTER — Inpatient Hospital Stay: Admission: RE | Admit: 2018-04-03 | Payer: Medicare HMO | Source: Ambulatory Visit

## 2018-04-03 ENCOUNTER — Encounter (HOSPITAL_COMMUNITY): Payer: Self-pay | Admitting: Emergency Medicine

## 2018-04-03 ENCOUNTER — Emergency Department (HOSPITAL_COMMUNITY)
Admission: EM | Admit: 2018-04-03 | Discharge: 2018-04-03 | Disposition: A | Discharge status: Home / Self Care | Payer: Medicare HMO | Attending: Emergency Medicine | Admitting: Emergency Medicine

## 2018-04-03 ENCOUNTER — Emergency Department (HOSPITAL_COMMUNITY): Payer: Medicare HMO

## 2018-04-03 DIAGNOSIS — I252 Old myocardial infarction: Secondary | ICD-10-CM | POA: Diagnosis not present

## 2018-04-03 DIAGNOSIS — R110 Nausea: Secondary | ICD-10-CM | POA: Diagnosis present

## 2018-04-03 DIAGNOSIS — R112 Nausea with vomiting, unspecified: Secondary | ICD-10-CM

## 2018-04-03 DIAGNOSIS — E876 Hypokalemia: Secondary | ICD-10-CM | POA: Diagnosis not present

## 2018-04-03 DIAGNOSIS — R197 Diarrhea, unspecified: Secondary | ICD-10-CM | POA: Diagnosis not present

## 2018-04-03 DIAGNOSIS — F0390 Unspecified dementia without behavioral disturbance: Secondary | ICD-10-CM | POA: Insufficient documentation

## 2018-04-03 DIAGNOSIS — R69 Illness, unspecified: Secondary | ICD-10-CM | POA: Diagnosis not present

## 2018-04-03 DIAGNOSIS — I10 Essential (primary) hypertension: Secondary | ICD-10-CM | POA: Diagnosis not present

## 2018-04-03 DIAGNOSIS — I2510 Atherosclerotic heart disease of native coronary artery without angina pectoris: Secondary | ICD-10-CM | POA: Diagnosis not present

## 2018-04-03 LAB — COMPREHENSIVE METABOLIC PANEL
ALT: 25 U/L (ref 0–44)
AST: 29 U/L (ref 15–41)
Albumin: 3.5 g/dL (ref 3.5–5.0)
Alkaline Phosphatase: 89 U/L (ref 38–126)
Anion gap: 8 (ref 5–15)
BUN: 14 mg/dL (ref 8–23)
CO2: 30 mmol/L (ref 22–32)
Calcium: 9.1 mg/dL (ref 8.9–10.3)
Chloride: 102 mmol/L (ref 98–111)
Creatinine, Ser: 0.8 mg/dL (ref 0.44–1.00)
GFR calc Af Amer: >60 mL/min (ref >60)
GFR calc non Af Amer: >60 mL/min (ref >60)
Glucose, Bld: 105 mg/dL — ABNORMAL HIGH (ref 70–99)
Potassium: 3.1 mmol/L — ABNORMAL LOW (ref 3.5–5.1)
Sodium: 140 mmol/L (ref 135–145)
Total Bilirubin: 1.7 mg/dL — ABNORMAL HIGH (ref 0.3–1.2)
Total Protein: 6.9 g/dL (ref 6.5–8.1)

## 2018-04-03 LAB — CBC
HCT: 43.2 % (ref 36.0–46.0)
Hemoglobin: 13.8 g/dL (ref 12.0–15.0)
MCH: 28.7 pg (ref 26.0–34.0)
MCHC: 31.9 g/dL (ref 30.0–36.0)
MCV: 89.8 fL (ref 78.0–100.0)
Platelets: 252 10*3/uL (ref 150–400)
RBC: 4.81 MIL/uL (ref 3.87–5.11)
RDW: 12 % (ref 11.5–15.5)
WBC: 9.2 10*3/uL (ref 4.0–10.5)

## 2018-04-03 LAB — URINALYSIS, ROUTINE W REFLEX MICROSCOPIC
Bilirubin Urine: NEGATIVE
Glucose, UA: NEGATIVE mg/dL
Ketones, ur: 5 mg/dL — AB
Nitrite: NEGATIVE
Protein, ur: NEGATIVE mg/dL
Specific Gravity, Urine: 1.017 (ref 1.005–1.030)
pH: 5 (ref 5.0–8.0)

## 2018-04-03 LAB — I-STAT CG4 LACTIC ACID, ED: Lactic Acid, Venous: 0.77 mmol/L (ref 0.5–1.9)

## 2018-04-03 LAB — I-STAT TROPONIN, ED: Troponin i, poc: 0.03 ng/mL (ref 0.00–0.08)

## 2018-04-03 LAB — LIPASE, BLOOD: Lipase: 50 U/L (ref 11–51)

## 2018-04-03 MED ORDER — ONDANSETRON 4 MG PO TBDP: 4.0000 mg | ORAL_TABLET | Freq: Three times a day (TID) | ORAL | 0 refills | Status: DC | PRN

## 2018-04-03 MED ORDER — POTASSIUM CHLORIDE CRYS ER 20 MEQ PO TBCR: 20.0000 meq | EXTENDED_RELEASE_TABLET | Freq: Two times a day (BID) | ORAL | 0 refills | Status: DC

## 2018-04-03 MED ORDER — LACTATED RINGERS IV BOLUS
500.0000 mL | Freq: Once | INTRAVENOUS | Status: AC
  Administered 2018-04-03: 500 mL via INTRAVENOUS

## 2018-04-03 MED ORDER — LOPERAMIDE HCL 2 MG PO CAPS: 2.0000 mg | ORAL_CAPSULE | Freq: Four times a day (QID) | ORAL | 0 refills | Status: DC | PRN

## 2018-04-03 NOTE — Care Management (Signed)
CM consult noted, CM went to patient's room to discuss the recommendation patient was discharged from room, ED CM will follow up with patient and family by phone within 24- 48 hours.

## 2018-04-03 NOTE — ED Provider Notes (Signed)
Emergency Department Provider Note   I have reviewed the triage vital signs and the nursing notes.   HISTORY  Chief Complaint Nausea and Diarrhea   HPI Tiffany Mack is a 77 y.o. female with PMH of CAD, Dementia, HTN, and recent admit for weakness and nausea/vomiting thought to be related to UTI. She was treated for UTI and urine culture confirmed E. Coli. No fever or chills at home. Patient denies any CP or SOB. No abdominal pain. Family reports occasional diarrhea and vomiting but nothing consistent. Has followed with her PCP who apparently re-checked blood work and UA. Patient reports feeling hungry now. Family states the patient has ben generally fatigued.   Level 5 caveat: Dementia.   Past Medical History:  Diagnosis Date  . Anxiety   . Coronary artery disease   . Dementia     Patient Active Problem List   Diagnosis Date Noted  . Malnutrition of moderate degree 03/24/2018  . 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 Vomiting 03/21/2018  . HTN (hypertension) 03/21/2018  . Dementia 03/21/2018  . CAD (coronary artery disease) 03/21/2018  . Acute non Q wave MI (myocardial infarction), initial episode of care [facility]) 08/04/2015    Past Surgical History:  Procedure Laterality Date  . CARDIAC CATHETERIZATION N/A 08/04/2015   Procedure: Left Heart Cath and Coronary Angiography;  Surgeon: Rinaldo Cloud, MD;  Location: [facility] INVASIVE CV LAB;  Service: Cardiovascular;  Laterality: N/A;  . CARDIAC CATHETERIZATION N/A 08/08/2015   Procedure: Coronary Stent Intervention;  Surgeon: Rinaldo Cloud, MD;  Location: [facility] INVASIVE CV LAB;  Service: Cardiovascular;  Laterality: N/A;    Allergies Patient has no known allergies.  History reviewed. No pertinent family history.  Social History Social History   Tobacco Use  . Smoking status: Never Smoker  . Smokeless tobacco: Never Used  Substance Use Topics  . Alcohol use: No  . Drug use: No    Review of Systems  Constitutional:  No fever/chills. Positive fatigue.  Eyes: No visual changes. ENT: No sore throat. Cardiovascular: Denies chest pain. Respiratory: Denies shortness of breath. Gastrointestinal: No abdominal pain. Positive nausea, vomiting, and diarrhea.  No constipation. Genitourinary: Negative for dysuria. Musculoskeletal: Negative for back pain. Skin: Negative for rash. Neurological: Negative for headaches, focal weakness or numbness.  10-point ROS otherwise negative.  ____________________________________________   PHYSICAL EXAM:  VITAL SIGNS: ED Triage Vitals [04/03/18 1419]  Enc Vitals Group     BP 116/64     Pulse Rate 79     Resp 16     Temp 98.2 F (36.8 C)     Temp Source Oral     SpO2 99 %     Pain Score 0   Constitutional: Alert and oriented with only mild confusion at times. Well appearing and in no acute distress. Eyes: Conjunctivae are normal. Head: Atraumatic. Nose: No congestion/rhinnorhea. Mouth/Throat: Mucous membranes are slightly dry.  Neck: No stridor.  Cardiovascular: Normal rate, regular rhythm. Good peripheral circulation. Grossly normal heart sounds.   Respiratory: Normal respiratory effort.  No retractions. Lungs CTAB. Gastrointestinal: Soft and nontender. No distention.  Musculoskeletal: No lower extremity tenderness nor edema. No gross deformities of extremities. Neurologic:  Normal speech and language. No gross focal neurologic deficits are appreciated.  Skin:  Skin is warm, dry and intact. No rash noted.  ____________________________________________   LABS (all labs ordered are listed, but only abnormal results are displayed)  Labs Reviewed  COMPREHENSIVE METABOLIC PANEL - Abnormal; Notable for the following components:  Result Value   Potassium 3.1 (*)    Glucose, Bld 105 (*)    Total Bilirubin 1.7 (*)    All other components within normal limits  URINALYSIS, ROUTINE W REFLEX MICROSCOPIC - Abnormal; Notable for the following components:    APPearance HAZY (*)    Hgb urine dipstick SMALL (*)    Ketones, ur 5 (*)    Leukocytes, UA MODERATE (*)    Bacteria, UA RARE (*)    Non Squamous Epithelial 0-5 (*)    All other components within normal limits  LIPASE, BLOOD  CBC  I-STAT TROPONIN, ED  I-STAT CG4 LACTIC ACID, ED   ____________________________________________  EKG   EKG Interpretation  Date/Time:  Friday April 03 2018 18:21:49 EDT Ventricular Rate:  63 PR Interval:    QRS Duration: 86 QT Interval:  408 QTC Calculation: 418 R Axis:   28 Text Interpretation:  Sinus rhythm Baseline wander in lead(s) V5 No STEMI.  Confirmed by Alona Bene [phone]) on 04/03/2018 11:53:11 PM       ____________________________________________  RADIOLOGY  Dg Abdomen Acute W/chest  Result Date: 04/03/2018 CLINICAL DATA:  Through history of nausea and diarrhea. The patient denies vomiting. EXAM: DG ABDOMEN ACUTE W/ 1V CHEST COMPARISON:  CT abdomen and pelvis 03/21/2018 FINDINGS: There is no evidence of dilated bowel loops or free intraperitoneal air. No radiopaque calculi or other significant radiographic abnormality is seen. Heart size and mediastinal contours are within normal limits. Both lungs are clear. IMPRESSION: Negative abdominal radiographs.  No acute cardiopulmonary disease. Electronically Signed   By: Marin Roberts M.D.   On: 04/03/2018 19:07    ____________________________________________   PROCEDURES  Procedure(s) performed:   Procedures  None ____________________________________________   INITIAL IMPRESSION / ASSESSMENT AND PLAN / ED COURSE  Pertinent labs & imaging results that were available during my care of the patient were reviewed by me and considered in my medical decision making (see chart for details).  Patient presents to the ED with generalized weakness and intermittent nausea, vomiting, and diarrhea. No UTI symptoms. N,V,D are not consistent. No focal neuro deficits. Labs reviewed with no  acute findings other than mild hypokalemia. Plan for home health order, potassium, and symptoms mgmt. Sending UA for culture without symptoms.   At this time, I do not feel there is any life-threatening condition present. I have reviewed and discussed all results (EKG, imaging, lab, urine as appropriate), exam findings with patient. I have reviewed nursing notes and appropriate previous records.  I feel the patient is safe to be discharged home without further emergent workup. Discussed usual and customary return precautions. Patient and family (if present) verbalize understanding and are comfortable with this plan.  Patient will follow-up with their primary care provider. If they do not have a primary care provider, information for follow-up has been provided to them. All questions have been answered.    ____________________________________________  FINAL CLINICAL IMPRESSION(S) / ED DIAGNOSES  Final diagnoses:  Nausea vomiting and diarrhea  Hypokalemia     MEDICATIONS GIVEN DURING THIS VISIT:  Medications  lactated ringers bolus 500 mL (0 mLs Intravenous Stopped 04/03/18 1939)     NEW OUTPATIENT MEDICATIONS STARTED DURING THIS VISIT:  Discharge Medication List as of 04/03/2018  9:05 PM    START taking these medications   Details  loperamide (IMODIUM) 2 MG capsule Take 1 capsule (2 mg total) by mouth 4 (four) times daily as needed for diarrhea or loose stools., Starting Fri 04/03/2018, Print  ondansetron (ZOFRAN ODT) 4 MG disintegrating tablet Take 1 tablet (4 mg total) by mouth every 8 (eight) hours as needed for nausea or vomiting., Starting Fri 04/03/2018, Print    potassium chloride SA (K-DUR,KLOR-CON) 20 MEQ tablet Take 1 tablet (20 mEq total) by mouth 2 (two) times daily for 5 days., Starting Fri 04/03/2018, Until Wed 04/08/2018, Print        Note:  This document was prepared using Dragon voice recognition software and may include unintentional dictation errors.  Alona Bene,  MD Emergency Medicine    Ailin Rochford, Arlyss Repress, MD 04/04/18 [phone]

## 2018-04-03 NOTE — ED Triage Notes (Signed)
Pt presents to ED for assessment of 3 weeks of n/v/d, intermittent in nature, no abdominal pain.  States she has tried the [employer] and was admitted last week for the same, but is still struggling with anorexia.  Pt is supposed to follow up with GI.

## 2018-04-03 NOTE — Discharge Instructions (Signed)

## 2018-04-03 NOTE — ED Notes (Signed)
Patient left at this time with all belongings. 

## 2018-04-05 ENCOUNTER — Telehealth: Payer: Self-pay | Admitting: Surgery

## 2018-04-05 NOTE — Telephone Encounter (Signed)
ED CM attempted to contact patient concerning HH recommendation, no answer and unable leave a VM. CM will follow up and make 2nd attempt 9/9.

## 2018-04-16 DIAGNOSIS — R194 Change in bowel habit: Secondary | ICD-10-CM | POA: Diagnosis not present

## 2018-04-16 DIAGNOSIS — R634 Abnormal weight loss: Secondary | ICD-10-CM | POA: Diagnosis not present

## 2018-04-17 ENCOUNTER — Ambulatory Visit
Admission: RE | Admit: 2018-04-17 | Discharge: 2018-04-17 | Disposition: A | Discharge status: Home / Self Care | Payer: Medicare HMO | Source: Ambulatory Visit | Attending: Neurology | Admitting: Neurology

## 2018-04-17 DIAGNOSIS — R413 Other amnesia: Secondary | ICD-10-CM | POA: Diagnosis not present

## 2018-04-22 ENCOUNTER — Telehealth: Payer: Self-pay

## 2018-04-22 NOTE — Telephone Encounter (Signed)
LMOM asking for return call to relay message below.  

## 2018-04-22 NOTE — Telephone Encounter (Signed)
-----   Message from Van ClinesKaren M Aquino, MD sent at 04/17/2018  1:53 PM EDT ----- Pls let patient/son know the MRI did not show any evidence of tumor, stroke, or bleed. It showed age-related changes. Thanks

## 2018-04-23 NOTE — Telephone Encounter (Signed)
Pt's son returned my call.  Relayed MRI results.

## 2018-05-20 DIAGNOSIS — E785 Hyperlipidemia, unspecified: Secondary | ICD-10-CM | POA: Diagnosis not present

## 2018-05-20 DIAGNOSIS — K219 Gastro-esophageal reflux disease without esophagitis: Secondary | ICD-10-CM | POA: Diagnosis not present

## 2018-05-20 DIAGNOSIS — I252 Old myocardial infarction: Secondary | ICD-10-CM | POA: Diagnosis not present

## 2018-05-20 DIAGNOSIS — I2510 Atherosclerotic heart disease of native coronary artery without angina pectoris: Secondary | ICD-10-CM | POA: Diagnosis not present

## 2018-05-20 DIAGNOSIS — J309 Allergic rhinitis, unspecified: Secondary | ICD-10-CM | POA: Diagnosis not present

## 2018-05-20 DIAGNOSIS — I10 Essential (primary) hypertension: Secondary | ICD-10-CM | POA: Diagnosis not present

## 2018-05-23 ENCOUNTER — Other Ambulatory Visit: Payer: Self-pay | Admitting: Gastroenterology

## 2018-05-23 DIAGNOSIS — Z1211 Encounter for screening for malignant neoplasm of colon: Secondary | ICD-10-CM

## 2018-06-04 DIAGNOSIS — Z0100 Encounter for examination of eyes and vision without abnormal findings: Secondary | ICD-10-CM | POA: Diagnosis not present

## 2018-08-04 DIAGNOSIS — R630 Anorexia: Secondary | ICD-10-CM | POA: Diagnosis not present

## 2018-08-04 DIAGNOSIS — R69 Illness, unspecified: Secondary | ICD-10-CM | POA: Diagnosis not present

## 2018-08-04 DIAGNOSIS — N390 Urinary tract infection, site not specified: Secondary | ICD-10-CM | POA: Diagnosis not present

## 2018-08-18 DIAGNOSIS — R69 Illness, unspecified: Secondary | ICD-10-CM | POA: Diagnosis not present

## 2018-08-18 DIAGNOSIS — R630 Anorexia: Secondary | ICD-10-CM | POA: Diagnosis not present

## 2018-08-18 DIAGNOSIS — E46 Unspecified protein-calorie malnutrition: Secondary | ICD-10-CM | POA: Diagnosis not present

## 2018-09-01 DIAGNOSIS — R51 Headache: Secondary | ICD-10-CM | POA: Diagnosis not present

## 2018-09-01 DIAGNOSIS — R69 Illness, unspecified: Secondary | ICD-10-CM | POA: Diagnosis not present

## 2018-09-01 DIAGNOSIS — N390 Urinary tract infection, site not specified: Secondary | ICD-10-CM | POA: Diagnosis not present

## 2018-09-01 DIAGNOSIS — E46 Unspecified protein-calorie malnutrition: Secondary | ICD-10-CM | POA: Diagnosis not present

## 2018-09-01 DIAGNOSIS — R197 Diarrhea, unspecified: Secondary | ICD-10-CM | POA: Diagnosis not present

## 2018-10-13 ENCOUNTER — Other Ambulatory Visit: Payer: Self-pay

## 2018-10-13 ENCOUNTER — Encounter: Payer: Self-pay | Admitting: Neurology

## 2018-10-13 ENCOUNTER — Ambulatory Visit (INDEPENDENT_AMBULATORY_CARE_PROVIDER_SITE_OTHER): Payer: Medicare HMO | Admitting: Neurology

## 2018-10-13 VITALS — BP 114/68 | HR 68 | Temp 97.6°F | Ht 67.0 in | Wt 138.0 lb

## 2018-10-13 DIAGNOSIS — F03A0 Unspecified dementia, mild, without behavioral disturbance, psychotic disturbance, mood disturbance, and anxiety: Secondary | ICD-10-CM

## 2018-10-13 DIAGNOSIS — F0390 Unspecified dementia without behavioral disturbance: Secondary | ICD-10-CM | POA: Diagnosis not present

## 2018-10-13 DIAGNOSIS — R69 Illness, unspecified: Secondary | ICD-10-CM | POA: Diagnosis not present

## 2018-10-13 NOTE — Progress Notes (Signed)
NEUROLOGY FOLLOW UP OFFICE NOTE  Tiffany Mack [id number] [birth year]  HISTORY OF PRESENT ILLNESS: I had the pleasure of seeing Tiffany Mack in follow-up in the neurology clinic on 10/13/2018.  The patient was last seen 8 months ago for mild dementia. She is again accompanied by her son who helps supplement the history today.  Records and images were personally reviewed where available.  I personally reviewed MRI brain without contrast done 03/2018 which did not show any acute changes. There was diffuse volume loss and mild chronic microvascular disease. 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 She was started on Donepezil 10mg  daily on her last visit and continues to take it. Her son reports that Rivastigmine patch was started 2 months ago by Dr. Paulino Rily for the memory, loss of appetite, diarrhea, and depression. She continues on both Donepezil and Rivastigmine, her son now administers medications. During the time these changes occurred, she was having back to back UTIs and was more confused and depressed. Her son feels her memory has been overall stable. He feels depression is better, she was sleeping a lot previously, this is improved. She is also on venlafaxine and mirtazapine. She continues to drive short distances without getting lost. Her son manages finances. She is independent with dressing and bathing. She has an aide coming twice a week for 4 hours to keep her company. She denies any headaches, dizziness, vision changes, focal numbness/tingling/weakness, no falls.   History on Initial Assessment 02/24/2018: This is a 78 year old right-handed woman with a history of hyperlipidemia, sleep apnea, presenting for evaluation of worsening memory. She knows her memory is not as good as it used to be, she states she forgets dates. Her son started noticing memory changes over the past year. She would occasionally repeat herself and would misplace things  frequently. She would hide her purse in weird places. He started helping her with managing finances after she missed a few bills 6 months and with house utilities. She was missing medications and now uses a [employer] which has helped. She denies getting lost driving, but her son reminds her that she ended up in [place] 2 months ago, she was going to [place] which is [place], but went [place] and ended up in [place]. She does not remember this. Her son reports she is fine with local driving. Her son thinks she had a hallucination 2-3 months ago, she was convinced someone walked in the house while she was washing dishes and never left. She says a little girl came in the sun room to the back of the house. No personality changes, her son reports she has always been OCD and has difficulties sitting still, always locking doors. No wandering behavior at night, sleep is good. She had an MMSE with Dr. Paulino Rily in 08/2016, 25/30, most recently on 01/2018 it was 20/30. Her mother had memory issues. She denies any history of significant head injuries, she rarely drinks alcohol. She has occasional constipation, otherwise denies any headaches, dizziness, diplopia, dysarthria, dysphagia, neck/back pain, focal numbness/tingling/weakness, bladder dysfunction. No anosmia, tremors, no falls.  PAST MEDICAL HISTORY: Past Medical History:  Diagnosis Date   Anxiety    Coronary artery disease    Dementia (HCC)     MEDICATIONS: Current Outpatient Medications on File Prior to Visit  Medication Sig Dispense Refill   aspirin EC 81 MG EC tablet Take 1 tablet (81 mg total) by mouth daily. 30 tablet 3   atorvastatin (LIPITOR) 80 MG tablet  Take 1 tablet (80 mg total) by mouth daily at 6 PM. 30 tablet 3   metoprolol tartrate (LOPRESSOR) 25 MG tablet Take 0.5 tablets (12.5 mg total) by mouth 2 (two) times daily. (Patient taking differently: Take 12.5 mg by mouth daily. ) 60 tablet 3   ondansetron (ZOFRAN) 8 MG tablet Take 8 mg  by mouth every 8 (eight) hours as needed for nausea/vomiting.     pantoprazole (PROTONIX) 40 MG tablet Take 1 tablet (40 mg total) by mouth daily at 6 (six) AM. 30 tablet 3   polyethylene glycol (MIRALAX / GLYCOLAX) packet Take 17 g by mouth daily as needed for mild constipation. 14 each 0   venlafaxine (EFFEXOR) 37.5 MG tablet Take 1 tablet by mouth daily.     loperamide (IMODIUM) 2 MG capsule Take 1 capsule (2 mg total) by mouth 4 (four) times daily as needed for diarrhea or loose stools. (Patient not taking: Reported on 10/13/2018) 12 capsule 0   nitroGLYCERIN (NITROSTAT) 0.4 MG SL tablet Place 1 tablet (0.4 mg total) under the tongue every 5 (five) minutes as needed for chest pain (CP or SOB). (Patient not taking: Reported on 10/13/2018) 25 tablet 12   No current facility-administered medications on file prior to visit.     ALLERGIES: No Known Allergies  FAMILY HISTORY: No family history on file.  SOCIAL HISTORY: Social History   Socioeconomic History   Marital status: Single    Spouse name: Not on file   Number of children: Not on file   Years of education: Not on file   Highest education level: Not on file  Occupational History   Not on file  Social Needs   Financial resource strain: Not on file   Food insecurity:    Worry: Not on file    Inability: Not on file   Transportation needs:    Medical: Not on file    Non-medical: Not on file  Tobacco Use   Smoking status: Never Smoker   Smokeless tobacco: Never Used  Substance and Sexual Activity   Alcohol use: No   Drug use: No   Sexual activity: Not on file  Lifestyle   Physical activity:    Days per week: Not on file    Minutes per session: Not on file   Stress: Not on file  Relationships   Social connections:    Talks on phone: Not on file    Gets together: Not on file    Attends religious service: Not on file    Active member of club or organization: Not on file    Attends meetings of clubs  or organizations: Not on file    Relationship status: Not on file   Intimate partner violence:    Fear of current or ex partner: Not on file    Emotionally abused: Not on file    Physically abused: Not on file    Forced sexual activity: Not on file  Other Topics Concern   Not on file  Social History Narrative   Not on file    REVIEW OF SYSTEMS: Constitutional: No fevers, chills, or sweats, no generalized fatigue, change in appetite Eyes: No visual changes, double vision, eye pain Ear, nose and throat: No hearing loss, ear pain, nasal congestion, sore throat Cardiovascular: No chest pain, palpitations Respiratory:  No shortness of breath at rest or with exertion, wheezes GastrointestinaI: No nausea, vomiting, diarrhea, abdominal pain, fecal incontinence Genitourinary:  No dysuria, urinary retention or frequency Musculoskeletal:  No  neck pain, back pain Integumentary: No rash, pruritus, skin lesions Neurological: as above Psychiatric: No depression, insomnia, anxiety Endocrine: No palpitations, fatigue, diaphoresis, mood swings, change in appetite, change in weight, increased thirst Hematologic/Lymphatic:  No anemia, purpura, petechiae. Allergic/Immunologic: no itchy/runny eyes, nasal congestion, recent allergic reactions, rashes  PHYSICAL EXAM: Vitals:   10/13/18 1534  BP: 114/68  Pulse: 68  Temp: 97.6 F (36.4 C)  SpO2: 98%   General: No acute distress Head:  Normocephalic/atraumatic Neck: supple, no paraspinal tenderness, full range of motion Heart:  Regular rate and rhythm Lungs:  Clear to auscultation bilaterally Back: No paraspinal tenderness Skin/Extremities: No rash, no edema Neurological Exam: alert and oriented to person, city, year. States it is April 23, Monday (it is Mar 17, Tues). No aphasia or dysarthria. Fund of knowledge is reduced.  Recent and remote memory are impaired.  Attention and concentration are reduced. Able to name objects, difficulty with  repetition. Montreal Cognitive Assessment  10/13/2018 02/24/2018  Visuospatial/ Executive (0/5) 2 2  Naming (0/3) 2 2  Attention: Read list of digits (0/2) 2 2  Attention: Read list of letters (0/1) 1 1  Attention: Serial 7 subtraction starting at 100 (0/3) 2 3  Language: Repeat phrase (0/2) 0 2  Language : Fluency (0/1) 0 0  Abstraction (0/2) 0 2  Delayed Recall (0/5) 1 0  Orientation (0/6) 2 6  Total 12 20    Cranial nerves: Pupils equal, round, reactive to light. Extraocular movements intact with no nystagmus. Visual fields full. Facial sensation intact. No facial asymmetry. Tongue, uvula, palate midline.  Motor: Bulk and tone normal, muscle strength 5/5 throughout with no pronator drift.  Sensation to light touch intact.  No extinction to double simultaneous stimulation.  Finger to nose testing intact.  Gait narrow-based and steady, difficulty with tandem walk.  Romberg negative.  IMPRESSION: This is a 78 yo RH woman with a history of hyperlipidemia, sleep apnea, with mild to moderate dementia, likely Alzheimer's disease. MRI brain no acute changes, mild chronic microvascular disease. She has been switched to Rivastigmine patch, however her son continued to give Donepezil as well. Instructed to stop Donepezil and continue patch. Continue to monitor driving. Her son now manages medications and finances. We again discussed the importance of control of vascular risk factors, physical exercise, and brain stimulation exercises. She will follow-up in 6 months and knows to call for any changes  Thank you for allowing me to participate in her care.  Please do not hesitate to call for any questions or concerns.  The duration of this appointment visit was 30 minutes of face-to-face time with the patient.  Greater than 50% of this time was spent in counseling, explanation of diagnosis, planning of further management, and coordination of care.   Patrcia Dolly, M.D.   CC: Dr. Paulino Rily

## 2018-10-13 NOTE — Patient Instructions (Signed)
1. Continue Rivastigmine patch daily 2. Follow-up in 6 months or so, call for any changes  FALL PRECAUTIONS: Be cautious when walking. Scan the area for obstacles that may increase the risk of trips and falls. When getting up in the mornings, sit up at the edge of the bed for a few minutes before getting out of bed. Consider elevating the bed at the head end to avoid drop of blood pressure when getting up. Walk always in a well-lit room (use night lights in the walls). Avoid area rugs or power cords from appliances in the middle of the walkways. Use a walker or a cane if necessary and consider physical therapy for balance exercise. Get your eyesight checked regularly.  HOME SAFETY: Consider the safety of the kitchen when operating appliances like stoves, microwave oven, and blender. Consider having supervision and share cooking responsibilities until no longer able to participate in those. Accidents with firearms and other hazards in the house should be identified and addressed as well.  DRIVING: Regarding driving, in patients with progressive memory problems, driving will be impaired. We advise to have someone else do the driving if trouble finding directions or if minor accidents are reported. Independent driving assessment is available to determine safety of driving.  ABILITY TO BE LEFT ALONE: If patient is unable to contact 911 operator, consider using LifeLine, or when the need is there, arrange for someone to stay with patients. Smoking is a fire hazard, consider supervision or cessation. Risk of wandering should be assessed by caregiver and if detected at any point, supervision and safe proof recommendations should be instituted.  RECOMMENDATIONS FOR ALL PATIENTS WITH MEMORY PROBLEMS: 1. Continue to exercise (Recommend 30 minutes of walking everyday, or 3 hours every week) 2. Increase social interactions - continue going to [place] and enjoy social gatherings with friends and family 3. Eat  healthy, avoid fried foods and eat more fruits and vegetables 4. Maintain adequate blood pressure, blood sugar, and blood cholesterol level. Reducing the risk of stroke and cardiovascular disease also helps promoting better memory. 5. Avoid stressful situations. Live a simple life and avoid aggravations. Organize your time and prepare for the next day in anticipation. 6. Sleep well, avoid any interruptions of sleep and avoid any distractions in the bedroom that may interfere with adequate sleep quality 7. Avoid sugar, avoid sweets as there is a strong link between excessive sugar intake, diabetes, and cognitive impairment The Mediterranean diet has been shown to help patients reduce the risk of progressive memory disorders and reduces cardiovascular risk. This includes eating fish, eat fruits and green leafy vegetables, nuts like almonds and hazelnuts, walnuts, and also use olive oil. Avoid fast foods and fried foods as much as possible. Avoid sweets and sugar as sugar use has been linked to worsening of memory function.  There is always a concern of gradual progression of memory problems. If this is the case, then we may need to adjust level of care according to patient needs. Support, both to the patient and caregiver, should then be put into place.

## 2019-03-01 DIAGNOSIS — R7301 Impaired fasting glucose: Secondary | ICD-10-CM | POA: Diagnosis not present

## 2019-03-01 DIAGNOSIS — Z0000 Encounter for general adult medical examination without abnormal findings: Secondary | ICD-10-CM | POA: Diagnosis not present

## 2019-03-01 DIAGNOSIS — I252 Old myocardial infarction: Secondary | ICD-10-CM | POA: Diagnosis not present

## 2019-03-01 DIAGNOSIS — E559 Vitamin D deficiency, unspecified: Secondary | ICD-10-CM | POA: Diagnosis not present

## 2019-03-01 DIAGNOSIS — E785 Hyperlipidemia, unspecified: Secondary | ICD-10-CM | POA: Diagnosis not present

## 2019-03-01 DIAGNOSIS — D692 Other nonthrombocytopenic purpura: Secondary | ICD-10-CM | POA: Diagnosis not present

## 2019-03-01 DIAGNOSIS — K219 Gastro-esophageal reflux disease without esophagitis: Secondary | ICD-10-CM | POA: Diagnosis not present

## 2019-03-01 DIAGNOSIS — N390 Urinary tract infection, site not specified: Secondary | ICD-10-CM | POA: Diagnosis not present

## 2019-03-01 DIAGNOSIS — I2510 Atherosclerotic heart disease of native coronary artery without angina pectoris: Secondary | ICD-10-CM | POA: Diagnosis not present

## 2019-03-01 DIAGNOSIS — I10 Essential (primary) hypertension: Secondary | ICD-10-CM | POA: Diagnosis not present

## 2019-03-01 DIAGNOSIS — G4733 Obstructive sleep apnea (adult) (pediatric): Secondary | ICD-10-CM | POA: Diagnosis not present

## 2019-03-01 DIAGNOSIS — Z79899 Other long term (current) drug therapy: Secondary | ICD-10-CM | POA: Diagnosis not present

## 2019-03-01 DIAGNOSIS — J309 Allergic rhinitis, unspecified: Secondary | ICD-10-CM | POA: Diagnosis not present

## 2019-05-26 ENCOUNTER — Other Ambulatory Visit: Payer: Self-pay

## 2019-05-26 ENCOUNTER — Encounter: Payer: Self-pay | Admitting: Neurology

## 2019-05-26 ENCOUNTER — Ambulatory Visit: Payer: Medicare HMO | Admitting: Neurology

## 2019-05-26 VITALS — BP 128/79 | HR 75 | Ht 67.0 in | Wt 162.0 lb

## 2019-05-26 DIAGNOSIS — R69 Illness, unspecified: Secondary | ICD-10-CM | POA: Diagnosis not present

## 2019-05-26 DIAGNOSIS — F0391 Unspecified dementia with behavioral disturbance: Secondary | ICD-10-CM | POA: Diagnosis not present

## 2019-05-26 DIAGNOSIS — F03B18 Unspecified dementia, moderate, with other behavioral disturbance: Secondary | ICD-10-CM

## 2019-05-26 MED ORDER — MEMANTINE HCL 10 MG PO TABS: ORAL_TABLET | ORAL | 11 refills | Status: DC

## 2019-05-26 NOTE — Patient Instructions (Signed)
1. Start Memantine (Namenda) 10mg : Take 1 tablet every night for 2 weeks, then increase to 1 tablet twice a day  2. Continue rivastigmine patch and all your other medications  3. Recommend 24/7 supervision  4. No further driving  5. Follow-up in 6 months, call for any changes  FALL PRECAUTIONS: Be cautious when walking. Scan the area for obstacles that may increase the risk of trips and falls. When getting up in the mornings, sit up at the edge of the bed for a few minutes before getting out of bed. Consider elevating the bed at the head end to avoid drop of blood pressure when getting up. Walk always in a well-lit room (use night lights in the walls). Avoid area rugs or power cords from appliances in the middle of the walkways. Use a walker or a cane if necessary and consider physical therapy for balance exercise. Get your eyesight checked regularly.  FINANCIAL OVERSIGHT: Supervision, especially oversight when making financial decisions or transactions is also recommended.  HOME SAFETY: Consider the safety of the kitchen when operating appliances like stoves, microwave oven, and blender. Consider having supervision and share cooking responsibilities until no longer able to participate in those. Accidents with firearms and other hazards in the house should be identified and addressed as well.  DRIVING: Regarding driving, in patients with progressive memory problems, driving will be impaired. We advise to have someone else do the driving if trouble finding directions or if minor accidents are reported. Independent driving assessment is available to determine safety of driving.  ABILITY TO BE LEFT ALONE: If patient is unable to contact 911 operator, consider using LifeLine, or when the need is there, arrange for someone to stay with patients. Smoking is a fire hazard, consider supervision or cessation. Risk of wandering should be assessed by caregiver and if detected at any point, supervision and  safe proof recommendations should be instituted.  MEDICATION SUPERVISION: Inability to self-administer medication needs to be constantly addressed. Implement a mechanism to ensure safe administration of the medications.  RECOMMENDATIONS FOR ALL PATIENTS WITH MEMORY PROBLEMS: 1. Continue to exercise (Recommend 30 minutes of walking everyday, or 3 hours every week) 2. Increase social interactions - continue going to [place] and enjoy social gatherings with friends and family 3. Eat healthy, avoid fried foods and eat more fruits and vegetables 4. Maintain adequate blood pressure, blood sugar, and blood cholesterol level. Reducing the risk of stroke and cardiovascular disease also helps promoting better memory. 5. Avoid stressful situations. Live a simple life and avoid aggravations. Organize your time and prepare for the next day in anticipation. 6. Sleep well, avoid any interruptions of sleep and avoid any distractions in the bedroom that may interfere with adequate sleep quality 7. Avoid sugar, avoid sweets as there is a strong link between excessive sugar intake, diabetes, and cognitive impairment The Mediterranean diet has been shown to help patients reduce the risk of progressive memory disorders and reduces cardiovascular risk. This includes eating fish, eat fruits and green leafy vegetables, nuts like almonds and hazelnuts, walnuts, and also use olive oil. Avoid fast foods and fried foods as much as possible. Avoid sweets and sugar as sugar use has been linked to worsening of memory function.  There is always a concern of gradual progression of memory problems. If this is the case, then we may need to adjust level of care according to patient needs. Support, both to the patient and caregiver, should then be put into place.

## 2019-05-26 NOTE — Progress Notes (Signed)
NEUROLOGY FOLLOW UP OFFICE NOTE  Tiffany Mack [id number] August 04, 1940  HISTORY OF PRESENT ILLNESS: I had the pleasure of seeing Tiffany Mack in follow-up in the neurology clinic on 05/26/2019.  The patient was last seen 7 months ago for dementia. She is again accompanied by her son who helps supplement the history today. MOCA 12/30 in March 2020. She is on he trivastigmine patch 9.5mg  daily. Her son manages medications, meals, finances. She does not drive. She continues to live alone, her sone lives 10 minutes away and visits frequently. She does not leave her house, no wandering, she says she is not comfortable outside her house. She says her memory is "so so, not the best, not the worst." Her son is reporting occasional hallucinations where there are peopl or babies in the house. There is an auditory and visual component, she states they talk to her, they are all over the house, in and out the door. She states they help themselves to food, a boxy of candy will be gone, "I am watching them very carefully." She hides things and they cannot find them. Her son reports she is fidgety and cannot sit still, they go somewhere then she would want to go home.   She denies any headaches but her son reminds her she is always pointing to one side of her head. She states her nose drips all the time. No dizziness, vision changes, focal numbness/tingling/weakness. She has had some stomach issues, during these times her son has found her 3-4 times on the bathroom floor wrapped in a towel. Sleep is good. She is listed to be taking mirtazapine and venlafaxine.  History on Initial Assessment 02/24/2018: This is a 78 year old right-handed woman with a history of hyperlipidemia, sleep apnea, presenting for evaluation of worsening memory. She knows her memory is not as good as it used to be, she states she forgets dates. Her son started noticing memory changes over the past year. She would occasionally repeat  herself and would misplace things frequently. She would hide her purse in weird places. He started helping her with managing finances after she missed a few bills 6 months and with house utilities. She was missing medications and now uses a Pillpak which has helped. She denies getting lost driving, but her son reminds her that she ended up in [place] 2 months ago, she was going to [place] which is [place], but went [place] and ended up in [place]. She does not remember this. Her son reports she is fine with local driving. Her son thinks she had a hallucination 2-3 months ago, she was convinced someone walked in the house while she was washing dishes and never left. She says a little girl came in the sun room to the back of the house. No personality changes, her son reports she has always been OCD and has difficulties sitting still, always locking doors. No wandering behavior at night, sleep is good. She had an MMSE with Dr. Paulino Rily in 08/2016, 25/30, most recently on 01/2018 it was 20/30. Her mother had memory issues. She denies any history of significant head injuries, she rarely drinks alcohol. She has occasional constipation, otherwise denies any headaches, dizziness, diplopia, dysarthria, dysphagia, neck/back pain, focal numbness/tingling/weakness, bladder dysfunction. No anosmia, tremors, no falls.  Diagnostic Data: MRI brain without contrast done 03/2018 did not show any acute changes. There was diffuse volume loss and mild chronic microvascular disease. 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   PAST  MEDICAL HISTORY: Past Medical History:  Diagnosis Date  . Anxiety   . Coronary artery disease   . Dementia [facility])     MEDICATIONS: Current Outpatient Medications on File Prior to Visit  Medication Sig Dispense Refill  . aspirin EC 81 MG EC tablet Take 1 tablet (81 mg total) by mouth daily. 30 tablet 3  . atorvastatin (LIPITOR) 80 MG tablet Take 1 tablet (80 mg  total) by mouth daily at 6 PM. 30 tablet 3  . loperamide (IMODIUM) 2 MG capsule Take 1 capsule (2 mg total) by mouth 4 (four) times daily as needed for diarrhea or loose stools. (Patient not taking: Reported on 10/13/2018) 12 capsule 0  . metoprolol tartrate (LOPRESSOR) 25 MG tablet Take 0.5 tablets (12.5 mg total) by mouth 2 (two) times daily. (Patient taking differently: Take 12.5 mg by mouth daily. ) 60 tablet 3  . mirtazapine (REMERON) 30 MG tablet     . nitroGLYCERIN (NITROSTAT) 0.4 MG SL tablet Place 1 tablet (0.4 mg total) under the tongue every 5 (five) minutes as needed for chest pain (CP or SOB). (Patient not taking: Reported on 10/13/2018) 25 tablet 12  . ondansetron (ZOFRAN) 8 MG tablet Take 8 mg by mouth every 8 (eight) hours as needed for nausea/vomiting.    . pantoprazole (PROTONIX) 40 MG tablet Take 1 tablet (40 mg total) by mouth daily at 6 (six) AM. 30 tablet 3  . polyethylene glycol (MIRALAX / GLYCOLAX) packet Take 17 g by mouth daily as needed for mild constipation. 14 each 0  . rivastigmine (EXELON) 9.5 mg/24hr Place 9.5 mg onto the skin daily.    Marland Kitchen venlafaxine (EFFEXOR) 37.5 MG tablet Take 1 tablet by mouth daily.     No current facility-administered medications on file prior to visit.     ALLERGIES: No Known Allergies  FAMILY HISTORY: No family history on file.  SOCIAL HISTORY: Social History   Socioeconomic History  . Marital status: Single    Spouse name: Not on file  . Number of children: Not on file  . Years of education: Not on file  . Highest education level: Not on file  Occupational History  . Not on file  Social Needs  . Financial resource strain: Not on file  . Food insecurity    Worry: Not on file    Inability: Not on file  . Transportation needs    Medical: Not on file    Non-medical: Not on file  Tobacco Use  . Smoking status: Never Smoker  . Smokeless tobacco: Never Used  Substance and Sexual Activity  . Alcohol use: No  . Drug use: No   . Sexual activity: Not on file  Lifestyle  . Physical activity    Days per week: Not on file    Minutes per session: Not on file  . Stress: Not on file  Relationships  . Social Musician on phone: Not on file    Gets together: Not on file    Attends religious service: Not on file    Active member of club or organization: Not on file    Attends meetings of clubs or organizations: Not on file    Relationship status: Not on file  . Intimate partner violence    Fear of current or ex partner: Not on file    Emotionally abused: Not on file    Physically abused: Not on file    Forced sexual activity: Not on file  Other  Topics Concern  . Not on file  Social History Narrative  . Not on file    REVIEW OF SYSTEMS: Constitutional: No fevers, chills, or sweats, no generalized fatigue, change in appetite Eyes: No visual changes, double vision, eye pain Ear, nose and throat: No hearing loss, ear pain, nasal congestion, sore throat Cardiovascular: No chest pain, palpitations Respiratory:  No shortness of breath at rest or with exertion, wheezes GastrointestinaI: No nausea, vomiting, diarrhea, abdominal pain, fecal incontinence Genitourinary:  No dysuria, urinary retention or frequency Musculoskeletal:  No neck pain, back pain Integumentary: No rash, pruritus, skin lesions Neurological: as above Psychiatric: No depression, insomnia, anxiety Endocrine: No palpitations, fatigue, diaphoresis, mood swings, change in appetite, change in weight, increased thirst Hematologic/Lymphatic:  No anemia, purpura, petechiae. Allergic/Immunologic: no itchy/runny eyes, nasal congestion, recent allergic reactions, rashes  PHYSICAL EXAM: Vitals:   05/26/19 1529  BP: 128/79  Pulse: 75   General: No acute distress Head:  Normocephalic/atraumatic Neck: supple, no paraspinal tenderness, full range of motion Heart:  Regular rate and rhythm Lungs:  Clear to auscultation bilaterally Back: No  paraspinal tenderness Skin/Extremities: No rash, no edema Neurological Exam: alert and oriented to person, city, year is [id number]. No aphasia or dysarthria. Fund of knowledge is reduced.  Recent and remote memory are impaired.  Attention and concentration are reduced.  [employer] Mental Exam 05/26/2019  Weekday Correct 1  Current year 0  What state are we in? 1  Amount spent 0  Amount left 0  # of Animals 0  5 objects recall 1  Number series 0  Hour markers 2  Time correct 0  Placed X in triangle correctly 0  Largest Figure 1  Name of female 0  Date back to work 0  Type of work 0  State she lived in 0  Total score 6    Montreal Cognitive Assessment  10/13/2018 02/24/2018  Visuospatial/ Executive (0/5) 2 2  Naming (0/3) 2 2  Attention: Read list of digits (0/2) 2 2  Attention: Read list of letters (0/1) 1 1  Attention: Serial 7 subtraction starting at 100 (0/3) 2 3  Language: Repeat phrase (0/2) 0 2  Language : Fluency (0/1) 0 0  Abstraction (0/2) 0 2  Delayed Recall (0/5) 1 0  Orientation (0/6) 2 6  Total 12 20    Cranial nerves: Pupils equal, round, reactive to light. Extraocular movements intact with no nystagmus. Visual fields full. Facial sensation intact. No facial asymmetry. Tongue, uvula, palate midline.  Motor: Bulk and tone normal, muscle strength 5/5 throughout with no pronator drift.  Finger to nose testing intact.  Gait narrow-based and steady, difficulty with tandem walk.  Romberg negative.  IMPRESSION: This is a 78 yo RH woman with a history of hyperlipidemia, sleep apnea, with moderate dementia, likely Alzheimer's disease. MRI brain no acute changes, mild chronic microvascular disease. She is now having hallucinations. SLUMS score 6/30. We discussed adding on Memantine to Rivastigmine, side effects and expectations from medication discussed. Start Memantine 10mg  1 tab qhs for 2 weeks, then increase to 1 tab BID. We discussed increasing supervision at home, no  further driving. We again discussed the importance of control of vascular risk factors, physical exercise, and brain stimulation exercises. She will follow-up in 6 months and knows to call for any changes  Thank you for allowing me to participate in her care.  Please do not hesitate to call for any questions or concerns.  The duration of this appointment visit was  30 minutes of face-to-face time with the patient.  Greater than 50% of this time was spent in counseling, explanation of diagnosis, planning of further management, and coordination of care.   Ellouise Newer, M.D.   CC: Dr. Stephanie Acre

## 2019-06-07 DIAGNOSIS — R69 Illness, unspecified: Secondary | ICD-10-CM | POA: Diagnosis not present

## 2019-06-07 DIAGNOSIS — I2510 Atherosclerotic heart disease of native coronary artery without angina pectoris: Secondary | ICD-10-CM | POA: Diagnosis not present

## 2019-06-07 DIAGNOSIS — K219 Gastro-esophageal reflux disease without esophagitis: Secondary | ICD-10-CM | POA: Diagnosis not present

## 2019-06-07 DIAGNOSIS — E785 Hyperlipidemia, unspecified: Secondary | ICD-10-CM | POA: Diagnosis not present

## 2019-06-07 DIAGNOSIS — E559 Vitamin D deficiency, unspecified: Secondary | ICD-10-CM | POA: Diagnosis not present

## 2019-06-07 DIAGNOSIS — I10 Essential (primary) hypertension: Secondary | ICD-10-CM | POA: Diagnosis not present

## 2019-06-07 DIAGNOSIS — R7303 Prediabetes: Secondary | ICD-10-CM | POA: Diagnosis not present

## 2019-06-07 DIAGNOSIS — J309 Allergic rhinitis, unspecified: Secondary | ICD-10-CM | POA: Diagnosis not present

## 2019-07-16 ENCOUNTER — Telehealth: Payer: Self-pay | Admitting: Neurology

## 2019-07-16 NOTE — Telephone Encounter (Signed)
Patient's son, Merry Proud, called and said he like to speak with a nurse regarding concerns about his mom. He said he is concerned because she has been sleeping a lot, seems to be having crazy dreams, and is overall much less alert.

## 2019-07-19 ENCOUNTER — Other Ambulatory Visit: Payer: Self-pay

## 2019-07-19 DIAGNOSIS — F0391 Unspecified dementia with behavioral disturbance: Secondary | ICD-10-CM

## 2019-07-19 DIAGNOSIS — F03B18 Unspecified dementia, moderate, with other behavioral disturbance: Secondary | ICD-10-CM

## 2019-07-19 NOTE — Telephone Encounter (Signed)
I thought son was administering medications? Pls confirm re: Namenda and Effexor if taking or not. We can adjust these if needed. For sudden changes in symptoms, would rule out infection. Pls order CBC, CMP, urinalysis. No falls?

## 2019-07-19 NOTE — Telephone Encounter (Signed)
Orders placed and son made aware. He is waiting to hear from pts PCP, Dr. Stephanie Acre for an appt today. If pt can get one he would like to have labs performed there. Son will call back with an update.

## 2019-07-19 NOTE — Telephone Encounter (Signed)
Per son pt is really depressed, less aware, sleeping a lot. These symptoms came on all of a sudden.  Pt continues to have hallucinations. Has seen a person in a body bag. Son can come home and find pt lying in the floor. Not from a fall. She will say the nurses put her there. Pt believes son is her deceased brother at times.  Son does not think pt is taking the Namenda or the Effexor. He is checking with the pharmacy now. Pt does continue using the Exelon Patch.

## 2019-07-23 ENCOUNTER — Other Ambulatory Visit: Payer: Self-pay

## 2019-07-23 ENCOUNTER — Emergency Department (HOSPITAL_COMMUNITY)
Admission: EM | Admit: 2019-07-23 | Discharge: 2019-07-23 | Disposition: A | Discharge status: Home / Self Care | Payer: Medicare HMO | Source: Home / Self Care

## 2019-07-23 ENCOUNTER — Encounter (HOSPITAL_COMMUNITY): Payer: Self-pay | Admitting: Emergency Medicine

## 2019-07-23 DIAGNOSIS — Z5321 Procedure and treatment not carried out due to patient leaving prior to being seen by health care provider: Secondary | ICD-10-CM | POA: Insufficient documentation

## 2019-07-23 DIAGNOSIS — R197 Diarrhea, unspecified: Secondary | ICD-10-CM | POA: Insufficient documentation

## 2019-07-23 DIAGNOSIS — R531 Weakness: Secondary | ICD-10-CM | POA: Insufficient documentation

## 2019-07-23 DIAGNOSIS — U071 COVID-19: Secondary | ICD-10-CM | POA: Diagnosis not present

## 2019-07-23 DIAGNOSIS — R630 Anorexia: Secondary | ICD-10-CM | POA: Insufficient documentation

## 2019-07-23 DIAGNOSIS — E876 Hypokalemia: Secondary | ICD-10-CM | POA: Diagnosis not present

## 2019-07-23 LAB — CBC
HCT: 39.6 % (ref 36.0–46.0)
Hemoglobin: 12.9 g/dL (ref 12.0–15.0)
MCH: 27.3 pg (ref 26.0–34.0)
MCHC: 32.6 g/dL (ref 30.0–36.0)
MCV: 83.7 fL (ref 80.0–100.0)
Platelets: 253 10*3/uL (ref 150–400)
RBC: 4.73 MIL/uL (ref 3.87–5.11)
RDW: 12.6 % (ref 11.5–15.5)
WBC: 7.8 10*3/uL (ref 4.0–10.5)
nRBC: 0 % (ref 0.0–0.2)

## 2019-07-23 LAB — BASIC METABOLIC PANEL
Anion gap: 11 (ref 5–15)
BUN: 9 mg/dL (ref 8–23)
CO2: 32 mmol/L (ref 22–32)
Calcium: 9.1 mg/dL (ref 8.9–10.3)
Chloride: 100 mmol/L (ref 98–111)
Creatinine, Ser: 0.81 mg/dL (ref 0.44–1.00)
GFR calc Af Amer: >60 mL/min (ref >60)
GFR calc non Af Amer: >60 mL/min (ref >60)
Glucose, Bld: 112 mg/dL — ABNORMAL HIGH (ref 70–99)
Potassium: 2.6 mmol/L — CL (ref 3.5–5.1)
Sodium: 143 mmol/L (ref 135–145)

## 2019-07-23 NOTE — ED Notes (Signed)
Pt became very restless.  RN explained to son that she was the next person to go back however she was too "figity" to stay.  RN recommended strongly that son take pt to her primary provider.  He agreed.

## 2019-07-23 NOTE — ED Triage Notes (Signed)
Patient presents with son c/o increased confusion x 2 weeks. Also new pressures to sacrum x 8-9 days. Family has been applying cream. Increased sleep and lack of appetite for past few days. Son states patient has been too drowsy to take home medications last night and this am.

## 2019-07-26 ENCOUNTER — Emergency Department (HOSPITAL_COMMUNITY): Payer: Medicare HMO

## 2019-07-26 ENCOUNTER — Encounter (HOSPITAL_COMMUNITY): Payer: Self-pay | Admitting: Emergency Medicine

## 2019-07-26 ENCOUNTER — Other Ambulatory Visit: Payer: Self-pay

## 2019-07-26 ENCOUNTER — Inpatient Hospital Stay (HOSPITAL_COMMUNITY)
Admission: EM | Admit: 2019-07-26 | Discharge: 2019-07-29 | DRG: 177 | Disposition: A | Discharge status: Home / Self Care | Payer: Medicare HMO | Attending: Internal Medicine | Admitting: Internal Medicine

## 2019-07-26 ENCOUNTER — Telehealth: Payer: Self-pay | Admitting: Neurology

## 2019-07-26 DIAGNOSIS — Z7982 Long term (current) use of aspirin: Secondary | ICD-10-CM

## 2019-07-26 DIAGNOSIS — F0390 Unspecified dementia without behavioral disturbance: Secondary | ICD-10-CM | POA: Diagnosis present

## 2019-07-26 DIAGNOSIS — F0280 Dementia in other diseases classified elsewhere without behavioral disturbance: Secondary | ICD-10-CM

## 2019-07-26 DIAGNOSIS — J189 Pneumonia, unspecified organism: Secondary | ICD-10-CM

## 2019-07-26 DIAGNOSIS — G309 Alzheimer's disease, unspecified: Secondary | ICD-10-CM

## 2019-07-26 DIAGNOSIS — F32A Depression, unspecified: Secondary | ICD-10-CM

## 2019-07-26 DIAGNOSIS — U071 COVID-19: Secondary | ICD-10-CM

## 2019-07-26 DIAGNOSIS — E785 Hyperlipidemia, unspecified: Secondary | ICD-10-CM | POA: Diagnosis present

## 2019-07-26 DIAGNOSIS — F0281 Dementia in other diseases classified elsewhere with behavioral disturbance: Secondary | ICD-10-CM | POA: Diagnosis not present

## 2019-07-26 DIAGNOSIS — E860 Dehydration: Secondary | ICD-10-CM | POA: Diagnosis present

## 2019-07-26 DIAGNOSIS — F329 Major depressive disorder, single episode, unspecified: Secondary | ICD-10-CM | POA: Diagnosis not present

## 2019-07-26 DIAGNOSIS — J1289 Other viral pneumonia: Secondary | ICD-10-CM | POA: Diagnosis present

## 2019-07-26 DIAGNOSIS — R918 Other nonspecific abnormal finding of lung field: Secondary | ICD-10-CM | POA: Diagnosis not present

## 2019-07-26 DIAGNOSIS — E876 Hypokalemia: Secondary | ICD-10-CM

## 2019-07-26 DIAGNOSIS — A0839 Other viral enteritis: Secondary | ICD-10-CM | POA: Diagnosis not present

## 2019-07-26 DIAGNOSIS — R69 Illness, unspecified: Secondary | ICD-10-CM | POA: Diagnosis not present

## 2019-07-26 DIAGNOSIS — N3000 Acute cystitis without hematuria: Secondary | ICD-10-CM | POA: Diagnosis not present

## 2019-07-26 DIAGNOSIS — I10 Essential (primary) hypertension: Secondary | ICD-10-CM | POA: Diagnosis not present

## 2019-07-26 DIAGNOSIS — K219 Gastro-esophageal reflux disease without esophagitis: Secondary | ICD-10-CM | POA: Diagnosis present

## 2019-07-26 DIAGNOSIS — J1282 Pneumonia due to coronavirus disease 2019: Secondary | ICD-10-CM

## 2019-07-26 DIAGNOSIS — I2510 Atherosclerotic heart disease of native coronary artery without angina pectoris: Secondary | ICD-10-CM | POA: Diagnosis not present

## 2019-07-26 DIAGNOSIS — L8990 Pressure ulcer of unspecified site, unspecified stage: Secondary | ICD-10-CM | POA: Insufficient documentation

## 2019-07-26 DIAGNOSIS — N390 Urinary tract infection, site not specified: Secondary | ICD-10-CM

## 2019-07-26 DIAGNOSIS — L89153 Pressure ulcer of sacral region, stage 3: Secondary | ICD-10-CM

## 2019-07-26 DIAGNOSIS — G9340 Encephalopathy, unspecified: Secondary | ICD-10-CM | POA: Diagnosis present

## 2019-07-26 DIAGNOSIS — G9341 Metabolic encephalopathy: Secondary | ICD-10-CM | POA: Diagnosis not present

## 2019-07-26 DIAGNOSIS — F319 Bipolar disorder, unspecified: Secondary | ICD-10-CM | POA: Diagnosis present

## 2019-07-26 DIAGNOSIS — R4182 Altered mental status, unspecified: Secondary | ICD-10-CM | POA: Diagnosis not present

## 2019-07-26 HISTORY — DX: COVID-19: U07.1

## 2019-07-26 LAB — URINALYSIS, ROUTINE W REFLEX MICROSCOPIC
Bilirubin Urine: NEGATIVE
Glucose, UA: NEGATIVE mg/dL
Ketones, ur: NEGATIVE mg/dL
Nitrite: POSITIVE — AB
Protein, ur: NEGATIVE mg/dL
Specific Gravity, Urine: 1.004 — ABNORMAL LOW (ref 1.005–1.030)
WBC, UA: >50 WBC/hpf — ABNORMAL HIGH (ref 0–5)
pH: 6 (ref 5.0–8.0)

## 2019-07-26 LAB — BASIC METABOLIC PANEL
Anion gap: 12 (ref 5–15)
BUN: 11 mg/dL (ref 8–23)
CO2: 34 mmol/L — ABNORMAL HIGH (ref 22–32)
Calcium: 9 mg/dL (ref 8.9–10.3)
Chloride: 95 mmol/L — ABNORMAL LOW (ref 98–111)
Creatinine, Ser: 0.77 mg/dL (ref 0.44–1.00)
GFR calc Af Amer: >60 mL/min (ref >60)
GFR calc non Af Amer: >60 mL/min (ref >60)
Glucose, Bld: 116 mg/dL — ABNORMAL HIGH (ref 70–99)
Potassium: 2.3 mmol/L — CL (ref 3.5–5.1)
Sodium: 141 mmol/L (ref 135–145)

## 2019-07-26 LAB — HEPATIC FUNCTION PANEL
ALT: 17 U/L (ref 0–44)
AST: 28 U/L (ref 15–41)
Albumin: 3.5 g/dL (ref 3.5–5.0)
Alkaline Phosphatase: 96 U/L (ref 38–126)
Bilirubin, Direct: 0.2 mg/dL (ref 0.0–0.2)
Indirect Bilirubin: 1 mg/dL — ABNORMAL HIGH (ref 0.3–0.9)
Total Bilirubin: 1.2 mg/dL (ref 0.3–1.2)
Total Protein: 7.3 g/dL (ref 6.5–8.1)

## 2019-07-26 LAB — CBC
HCT: 41.5 % (ref 36.0–46.0)
Hemoglobin: 13.2 g/dL (ref 12.0–15.0)
MCH: 27.2 pg (ref 26.0–34.0)
MCHC: 31.8 g/dL (ref 30.0–36.0)
MCV: 85.6 fL (ref 80.0–100.0)
Platelets: 293 10*3/uL (ref 150–400)
RBC: 4.85 MIL/uL (ref 3.87–5.11)
RDW: 12.8 % (ref 11.5–15.5)
WBC: 9.7 10*3/uL (ref 4.0–10.5)
nRBC: 0 % (ref 0.0–0.2)

## 2019-07-26 LAB — RESPIRATORY PANEL BY RT PCR (FLU A&B, COVID)
Influenza A by PCR: NEGATIVE
Influenza B by PCR: NEGATIVE
SARS Coronavirus 2 by RT PCR: POSITIVE — AB

## 2019-07-26 LAB — LACTIC ACID, PLASMA: Lactic Acid, Venous: 0.9 mmol/L (ref 0.5–1.9)

## 2019-07-26 LAB — TROPONIN I (HIGH SENSITIVITY)
Troponin I (High Sensitivity): 14 ng/L (ref <18)
Troponin I (High Sensitivity): 14 ng/L (ref <18)

## 2019-07-26 LAB — POC SARS CORONAVIRUS 2 AG -  ED: SARS Coronavirus 2 Ag: NEGATIVE

## 2019-07-26 LAB — PROCALCITONIN: Procalcitonin: <0.1 ng/mL

## 2019-07-26 MED ORDER — ENOXAPARIN SODIUM 40 MG/0.4ML ~~LOC~~ SOLN
40.0000 mg | SUBCUTANEOUS | Status: DC
  Administered 2019-07-27 – 2019-07-28 (×3): 40 mg via SUBCUTANEOUS
  Filled 2019-07-26 (×3): qty 0.4

## 2019-07-26 MED ORDER — POTASSIUM CHLORIDE 10 MEQ/100ML IV SOLN
10.0000 meq | INTRAVENOUS | Status: AC
  Administered 2019-07-26 (×3): 10 meq via INTRAVENOUS
  Filled 2019-07-26 (×4): qty 100

## 2019-07-26 MED ORDER — ONDANSETRON HCL 4 MG/2ML IJ SOLN: 4.0000 mg | Freq: Four times a day (QID) | INTRAMUSCULAR | Status: DC | PRN

## 2019-07-26 MED ORDER — DEXTROSE 5 % IV SOLN
250.0000 mg | INTRAVENOUS | Status: DC
  Administered 2019-07-27: 250 mg via INTRAVENOUS
  Filled 2019-07-26: qty 250

## 2019-07-26 MED ORDER — ACETAMINOPHEN 325 MG PO TABS: 650.0000 mg | ORAL_TABLET | Freq: Four times a day (QID) | ORAL | Status: DC | PRN

## 2019-07-26 MED ORDER — PANTOPRAZOLE SODIUM 40 MG PO TBEC
40.0000 mg | DELAYED_RELEASE_TABLET | Freq: Every day | ORAL | Status: DC
  Administered 2019-07-27 – 2019-07-29 (×3): 40 mg via ORAL
  Filled 2019-07-26 (×3): qty 1

## 2019-07-26 MED ORDER — SODIUM CHLORIDE 0.9 % IV SOLN
1.0000 g | Freq: Once | INTRAVENOUS | Status: AC
  Administered 2019-07-26: 1 g via INTRAVENOUS
  Filled 2019-07-26: qty 10

## 2019-07-26 MED ORDER — SODIUM CHLORIDE 0.9 % IV SOLN
500.0000 mg | Freq: Once | INTRAVENOUS | Status: AC
  Administered 2019-07-26: 500 mg via INTRAVENOUS
  Filled 2019-07-26: qty 500

## 2019-07-26 MED ORDER — POTASSIUM CHLORIDE 10 MEQ/100ML IV SOLN
10.0000 meq | Freq: Once | INTRAVENOUS | Status: AC
  Administered 2019-07-26: 10 meq via INTRAVENOUS

## 2019-07-26 MED ORDER — SODIUM CHLORIDE 0.9 % IV BOLUS
1000.0000 mL | Freq: Once | INTRAVENOUS | Status: AC
  Administered 2019-07-26: 1000 mL via INTRAVENOUS

## 2019-07-26 MED ORDER — MEMANTINE HCL 5 MG PO TABS
10.0000 mg | ORAL_TABLET | Freq: Two times a day (BID) | ORAL | Status: DC
  Administered 2019-07-27 – 2019-07-29 (×6): 10 mg via ORAL
  Filled 2019-07-26 (×10): qty 2

## 2019-07-26 MED ORDER — MAGNESIUM SULFATE 2 GM/50ML IV SOLN
2.0000 g | Freq: Once | INTRAVENOUS | Status: AC
  Administered 2019-07-26: 2 g via INTRAVENOUS
  Filled 2019-07-26: qty 50

## 2019-07-26 MED ORDER — ACETAMINOPHEN 650 MG RE SUPP: 650.0000 mg | Freq: Four times a day (QID) | RECTAL | Status: DC | PRN

## 2019-07-26 MED ORDER — MIRTAZAPINE 30 MG PO TABS
30.0000 mg | ORAL_TABLET | Freq: Every day | ORAL | Status: DC
  Administered 2019-07-27 – 2019-07-28 (×2): 30 mg via ORAL
  Filled 2019-07-26 (×2): qty 1
  Filled 2019-07-26: qty 2

## 2019-07-26 MED ORDER — SODIUM CHLORIDE 0.9 % IV SOLN
1.0000 g | INTRAVENOUS | Status: DC
  Administered 2019-07-27 – 2019-07-28 (×2): 1 g via INTRAVENOUS
  Filled 2019-07-26 (×2): qty 10

## 2019-07-26 MED ORDER — RIVASTIGMINE 13.3 MG/24HR TD PT24
13.3000 mg | MEDICATED_PATCH | Freq: Every day | TRANSDERMAL | Status: DC
  Administered 2019-07-27 – 2019-07-29 (×3): 13.3 mg via TRANSDERMAL
  Filled 2019-07-26 (×4): qty 1

## 2019-07-26 MED ORDER — SODIUM CHLORIDE 0.9 % IV SOLN: INTRAVENOUS | Status: DC

## 2019-07-26 MED ORDER — VENLAFAXINE HCL 37.5 MG PO TABS: 37.5000 mg | ORAL_TABLET | Freq: Every day | ORAL | Status: DC

## 2019-07-26 MED ORDER — METOPROLOL TARTRATE 25 MG PO TABS
12.5000 mg | ORAL_TABLET | Freq: Every day | ORAL | Status: DC
  Administered 2019-07-27 – 2019-07-29 (×3): 12.5 mg via ORAL
  Filled 2019-07-26 (×3): qty 1

## 2019-07-26 MED ORDER — ASPIRIN EC 81 MG PO TBEC
81.0000 mg | DELAYED_RELEASE_TABLET | Freq: Every day | ORAL | Status: DC
  Administered 2019-07-27 – 2019-07-29 (×3): 81 mg via ORAL
  Filled 2019-07-26 (×3): qty 1

## 2019-07-26 MED ORDER — ONDANSETRON HCL 4 MG PO TABS
4.0000 mg | ORAL_TABLET | Freq: Four times a day (QID) | ORAL | Status: DC | PRN
  Administered 2019-07-29: 4 mg via ORAL
  Filled 2019-07-26: qty 1

## 2019-07-26 NOTE — H&P (Signed)
History and Physical    Tiffany Mack HFW:[id number] DOB: 30-Dec-1940 DOA: 07/26/2019  PCP: Jonathon Jordan, MD Patient coming from: Home  Chief Complaint: intractable diarrhea and increased confusion  HPI: Tiffany Mack is a 78 y.o. female with medical history significant of Anxiety, Dementia, and CAD, HTN, GERD, depression, HLD. Pt presents w/ son who provides history due to pts confusion. Pt simply states she doesn't feel well and has diarrhea. No other acute complaints. Son states that she has had diarrhea for the past 4-5 days with marked worsening over the last 2 days. Over this period of time she has bewcome more and more confused. States she has been seeing a dead familyu member off and on for the past 10 days. Pt has had difficulty w/ urination over the last couple of days. Pt has spent an increased amount of time and has developed bed sores per pts son and caregiver.    ED Course: objective findings below  Review of Systems: As per HPI otherwise all other systems reviewed and are negative  Ambulatory Status:unclear  Past Medical History:  Diagnosis Date  . Anxiety   . Coronary artery disease   . Dementia [facility])     Past Surgical History:  Procedure Laterality Date  . CARDIAC CATHETERIZATION N/A 08/04/2015   Procedure: Left Heart Cath and Coronary Angiography;  Surgeon: Charolette Forward, MD;  Location: [place] [facility];  Service: Cardiovascular;  Laterality: N/A;  . CARDIAC CATHETERIZATION N/A 08/08/2015   Procedure: Coronary Stent Intervention;  Surgeon: Charolette Forward, MD;  Location: [place] [facility];  Service: Cardiovascular;  Laterality: N/A;    Social History   Socioeconomic History  . Marital status: Single    Spouse name: Not on file  . Number of children: Not on file  . Years of education: Not on file  . Highest education level: Not on file  Occupational History  . Not on file  Tobacco Use  . Smoking status: Never Smoker  . Smokeless tobacco:  Never Used  Substance and Sexual Activity  . Alcohol use: No  . Drug use: No  . Sexual activity: Not on file  Other Topics Concern  . Not on file  Social History Narrative  . Not on file   Social Determinants of Health   Financial Resource Strain:   . Difficulty of Paying Living Expenses: Not on file  Food Insecurity:   . Worried About Charity fundraiser in the Last Year: Not on file  . Ran Out of Food in the Last Year: Not on file  Transportation Needs:   . Lack of Transportation (Medical): Not on file  . Lack of Transportation (Non-Medical): Not on file  Physical Activity:   . Days of Exercise per Week: Not on file  . Minutes of Exercise per Session: Not on file  Stress:   . Feeling of Stress : Not on file  Social Connections:   . Frequency of Communication with Friends and Family: Not on file  . Frequency of Social Gatherings with Friends and Family: Not on file  . Attends Religious Services: Not on file  . Active Member of Clubs or Organizations: Not on file  . Attends Archivist Meetings: Not on file  . Marital Status: Not on file  Intimate Partner Violence:   . Fear of Current or Ex-Partner: Not on file  . Emotionally Abused: Not on file  . Physically Abused: Not on file  . Sexually Abused: Not on file  No Known Allergies  History reviewed. No pertinent family history.  Unable to obtain due to dementia.   Prior to Admission medications   Medication Sig Start Date End Date Taking? Authorizing Provider  aspirin EC 81 MG EC tablet Take 1 tablet (81 mg total) by mouth daily. Patient taking differently: Take 40.5 mg by mouth 2 (two) times daily.  08/11/15  Yes [name], Mohan, MD  atorvastatin (LIPITOR) 80 MG tablet Take 1 tablet (80 mg total) by mouth daily at 6 PM. 08/11/15  Yes [name], Mohan, MD  memantine (NAMENDA) 10 MG tablet Take 1 tablet every night for 2 weeks, then increase to 1 tablet twice a day Patient taking differently: Take 10 mg by mouth 2  (two) times daily.  05/26/19  Yes Van ClinesAquino, Karen M, MD  metoprolol tartrate (LOPRESSOR) 25 MG tablet Take 0.5 tablets (12.5 mg total) by mouth 2 (two) times daily. Patient taking differently: Take 12.5 mg by mouth daily.  08/11/15  Yes [name], Mohan, MD  mirtazapine (REMERON) 30 MG tablet Take 30 mg by mouth daily.  10/08/18  Yes [provider]  pantoprazole (PROTONIX) 40 MG tablet Take 1 tablet (40 mg total) by mouth daily at 6 (six) AM. 08/11/15  Yes [name], Mohan, MD  rivastigmine (EXELON) 13.3 MG/24HR Place 13.3 mg onto the skin daily.  06/22/19  Yes [provider]  venlafaxine (EFFEXOR) 37.5 MG tablet Take 1 tablet by mouth daily. 01/28/17  Yes [provider]  loperamide (IMODIUM) 2 MG capsule Take 1 capsule (2 mg total) by mouth 4 (four) times daily as needed for diarrhea or loose stools. Patient not taking: Reported on 07/26/2019 04/03/18   Long, Arlyss RepressJoshua G, MD  nitroGLYCERIN (NITROSTAT) 0.4 MG SL tablet Place 1 tablet (0.4 mg total) under the tongue every 5 (five) minutes as needed for chest pain (CP or SOB). 08/11/15   [name], Mohan, MD  ondansetron (ZOFRAN) 8 MG tablet Take 8 mg by mouth every 8 (eight) hours as needed for nausea/vomiting. 03/17/18   [provider]  polyethylene glycol (MIRALAX / GLYCOLAX) packet Take 17 g by mouth daily as needed for mild constipation. Patient not taking: Reported on 07/26/2019 03/24/18   Osvaldo ShipperKrishnan, Gokul, MD  rivastigmine (EXELON) 9.5 mg/24hr Place 13.3 mg onto the skin daily.     [provider]    Physical Exam: Vitals:   07/26/19 1400 07/26/19 1500 07/26/19 1630 07/26/19 1815  BP: (!) 118/48 (!) 128/53 (!) 135/52   Pulse: 63 66 75 72  Resp: 14 15    Temp:      TempSrc:      SpO2: 100% 100% 93% 95%  Weight:      Height:         General: Elderly and frail,  Appears calm and comfortable Eyes:  PERRL, EOMI, normal lids, iris ENT: Dry MM grossly normal hearing Neck:  no LAD, masses or  thyromegaly Cardiovascular: III/VI systolic murmur, RRR, Trace LE edema Respiratory: Diminished respirations in RLL, nml effort.  Abdomen: suprapubic ttp, nd, soft Skin: Sacral wounds not visualized at this time due tpt preference given recent dressing changes by RN staff.  Musculoskeletal:  grossly normal tone BUE/BLE, good ROM, no bony abnormality Psychiatric: Pleasant. Follows basic commands.  Neurologic:  CN 2-12 grossly intact, moves all extremities in coordinated fashion, sensation intact  Labs on Admission: I have personally reviewed following labs and imaging studies  CBC: Recent Labs  Lab 07/23/19 1832 07/26/19 1158  WBC 7.8 9.7  HGB 12.9  13.2  HCT 39.6 41.5  MCV 83.7 85.6  PLT 253 293   Basic Metabolic Panel: Recent Labs  Lab 07/23/19 1832 07/26/19 1158  NA 143 141  K 2.6* 2.3*  CL 100 95*  CO2 32 34*  GLUCOSE 112* 116*  BUN 9 11  CREATININE 0.81 0.77  CALCIUM 9.1 9.0   GFR: Estimated Creatinine Clearance: 54.3 mL/min (by C-G formula based on SCr of 0.77 mg/dL). Liver Function Tests: Recent Labs  Lab 07/26/19 1158  AST 28  ALT 17  ALKPHOS 96  BILITOT 1.2  PROT 7.3  ALBUMIN 3.5   No results for input(s): LIPASE, AMYLASE in the last 168 hours. No results for input(s): AMMONIA in the last 168 hours. Coagulation Profile: No results for input(s): INR, PROTIME in the last 168 hours. Cardiac Enzymes: No results for input(s): CKTOTAL, CKMB, CKMBINDEX, TROPONINI in the last 168 hours. BNP (last 3 results) No results for input(s): PROBNP in the last 8760 hours. HbA1C: No results for input(s): HGBA1C in the last 72 hours. CBG: No results for input(s): GLUCAP in the last 168 hours. Lipid Profile: No results for input(s): CHOL, HDL, LDLCALC, TRIG, CHOLHDL, LDLDIRECT in the last 72 hours. Thyroid Function Tests: No results for input(s): TSH, T4TOTAL, FREET4, T3FREE, THYROIDAB in the last 72 hours. Anemia Panel: No results for input(s): VITAMINB12, FOLATE,  FERRITIN, TIBC, IRON, RETICCTPCT in the last 72 hours. Urine analysis:    Component Value Date/Time   COLORURINE YELLOW 07/26/2019 1522   APPEARANCEUR HAZY (A) 07/26/2019 1522   LABSPEC 1.004 (L) 07/26/2019 1522   PHURINE 6.0 07/26/2019 1522   GLUCOSEU NEGATIVE 07/26/2019 1522   HGBUR SMALL (A) 07/26/2019 1522   BILIRUBINUR NEGATIVE 07/26/2019 1522   KETONESUR NEGATIVE 07/26/2019 1522   PROTEINUR NEGATIVE 07/26/2019 1522   NITRITE POSITIVE (A) 07/26/2019 1522   LEUKOCYTESUR MODERATE (A) 07/26/2019 1522    Creatinine Clearance: Estimated Creatinine Clearance: 54.3 mL/min (by C-G formula based on SCr of 0.77 mg/dL).  Sepsis Labs: (procalcitonin:4,lacticidven:4) )No results found for this or any previous visit (from the past 240 hour(s)).   Radiological Exams on Admission: CT Head Wo Contrast  Result Date: 07/26/2019 CLINICAL DATA:  History of dimension with mental status changes over the past few days, initial encounter EXAM: CT HEAD WITHOUT CONTRAST TECHNIQUE: Contiguous axial images were obtained from the base of the skull through the vertex without intravenous contrast. COMPARISON:  01/26/2015 FINDINGS: Brain: No evidence of acute infarction, hemorrhage, hydrocephalus, extra-axial collection or mass lesion/mass effect. Mild atrophic and chronic white matter ischemic changes are noted. Vascular: No hyperdense vessel or unexpected calcification. Skull: Normal. Negative for fracture or focal lesion. Sinuses/Orbits: No acute finding. Other: None. IMPRESSION: Chronic atrophic and ischemic changes without acute abnormality. Electronically Signed   By: Alcide Clever M.D.   On: 07/26/2019 15:34   DG Chest Port 1 View  Result Date: 07/26/2019 CLINICAL DATA:  Decreased appetite EXAM: PORTABLE CHEST 1 VIEW COMPARISON:  07/10/2016 FINDINGS: The heart size and mediastinal contours are within normal limits. Small slightly irregular airspace opacities are noted within the right upper  lobe. Left lung appears clear. No pleural effusion or pneumothorax. The visualized skeletal structures are unremarkable. IMPRESSION: Small slightly irregular airspace opacities in the right upper lobe, this may represent developing pneumonia. Electronically Signed   By: Duanne Guess D.O.   On: 07/26/2019 14:57    EKG: Independently reviewed. NSR, No ACS, nonspecific t wave chagnes.   Assessment/Plan Active Problems:   HTN (hypertension)  Dementia (HCC)   CAD (coronary artery disease)   Acute encephalopathy   Pressure injury of skin   CAP (community acquired pneumonia)   Hypokalemia   Acute lower UTI   Depression   Acute encephalopathy: likely secondary to UTI and possible CAP and baseline dementia. CoVID negative.  - tx of UTI - Continue Namenda, Exelon  UTI: UA positve and w/ suprapubic ttp. Minimal Urine output for days. \ - UCX - COntinue rocephin  Hypokalemia: secondary to poor po and makred GI loss. Repleted in ED - BMP in am - monitor further GI loss  Sacral Ulcerations: secondary to being in bed for multiple days in a row.  - Pt/OT - wound care  CAP: questionable RUL infiltrate. Pt laying in bed for several days puts her at risk. COVID negative.  - procalcitonin - Monitor respiratory status.  - Continu Azithro for now.   Diarrhea: pt apparently has episodes like this from time to time. Question viral process vs IBS.  - Monitor output - consider GI panel if warranted  - consider Imodium  Depressoin: - continue Effexor  HTN: - continue metop  CAD: no ACS on EKG - ocntinue ASA  HLD:  - continue Statin    DVT prophylaxis: Lovenox  Code Status: full  - confirmed w/ son Family Communication: Son  Disposition Plan: Pending improvement   Consults called: None  Admission status: inpt.     Ozella Rocks MD [facility]  If 7PM-7AM, please contact night-coverage [email or web address] Password [facility]  07/26/2019, 8:08 PM

## 2019-07-26 NOTE — Telephone Encounter (Signed)
Spoke with son Merry Proud. He is on the way with the pt to [place] has not ate or had anything to drink in 2-3 days. She feels like she has to go the the restroom but is unable to urinate. At [facility] ED they waited for 4 hours and was unable to be seen? Unable to give urine at that time.  Son states that pt has at least three ulcers on buttock area. One does have an open sore. He will have her evaluated at [facility] today.

## 2019-07-26 NOTE — Telephone Encounter (Signed)
Agree with above 

## 2019-07-26 NOTE — ED Provider Notes (Signed)
[facility] EMERGENCY DEPARTMENT Provider Note   CSN: [id number] Arrival date & time: 07/26/19  1108     History Chief Complaint  Patient presents with  . Dehydration   LEVEL 5 CAVEAT - DEMENTIA  Tiffany Mack is a 78 y.o. female with PMHx dementia who presents to the ED today with son with concern for dehydration. Most of history obtained by son - he reports that for the past 4-5 days pt has not been eating and drinking very much. He also reports that she has had some diarrhea although states that she intermittently has diarrhea. Pt has seemed more confused as of late to him as well; he reports that for the past 10 days she has been seeing dead family members which is not typical for her. Pt has also thought that her son was her dead brother. Son is concerned she could have a UTI because she has had some difficulty urinating recently as well. Son denies any fevers. He does mention that his wife was diagnosed with COVID ~ 15 days ago and has been quarantining in another part of the house away from all of them. Son had a negative covid test but his mom was not tested. He denies headache, cough, chest pain, shortness of breath, abdominal pain, vomiting.   Son also reports that pt has a sacral pressure ulcer that he has been treating himself for the past 12 days. He reports putting OTC ointment on the area but reports it still looks unchanged from when he first noticed it. Pt is complaining of pain to the area. Son has not noticed any drainage. He reports he has attempted to get pt in to see her PCP but unfortunately with covid they only want to do telemedicine visits.   The history is provided by the patient and a relative.       Past Medical History:  Diagnosis Date  . Anxiety   . Coronary artery disease   . Dementia [facility])     Patient Active Problem List   Diagnosis Date Noted  . Acute encephalopathy 07/26/2019  . Malnutrition of moderate degree 03/24/2018  . 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 Vomiting 03/21/2018  . HTN (hypertension) 03/21/2018  . Dementia (HCC) 03/21/2018  . CAD (coronary artery disease) 03/21/2018  . Acute non Q wave MI (myocardial infarction), initial episode of care [facility]) 08/04/2015    Past Surgical History:  Procedure Laterality Date  . CARDIAC CATHETERIZATION N/A 08/04/2015   Procedure: Left Heart Cath and Coronary Angiography;  Surgeon: Rinaldo Cloud, MD;  Location: [facility] INVASIVE CV LAB;  Service: Cardiovascular;  Laterality: N/A;  . CARDIAC CATHETERIZATION N/A 08/08/2015   Procedure: Coronary Stent Intervention;  Surgeon: Rinaldo Cloud, MD;  Location: [facility] INVASIVE CV LAB;  Service: Cardiovascular;  Laterality: N/A;     OB History   No obstetric history on file.     History reviewed. No pertinent family history.  Social History   Tobacco Use  . Smoking status: Never Smoker  . Smokeless tobacco: Never Used  Substance Use Topics  . Alcohol use: No  . Drug use: No    Home Medications Prior to Admission medications   Medication Sig Start Date End Date Taking? Authorizing Provider  aspirin EC 81 MG EC tablet Take 1 tablet (81 mg total) by mouth daily. Patient taking differently: Take 40.5 mg by mouth 2 (two) times daily.  08/11/15  Yes Rinaldo Cloud, MD  atorvastatin (LIPITOR) 80 MG tablet Take 1 tablet (80 mg total)  by mouth daily at 6 PM. 08/11/15  Yes Charolette Forward, MD  memantine (NAMENDA) 10 MG tablet Take 1 tablet every night for 2 weeks, then increase to 1 tablet twice a day Patient taking differently: Take 10 mg by mouth 2 (two) times daily.  05/26/19  Yes Cameron Sprang, MD  metoprolol tartrate (LOPRESSOR) 25 MG tablet Take 0.5 tablets (12.5 mg total) by mouth 2 (two) times daily. Patient taking differently: Take 12.5 mg by mouth daily.  08/11/15  Yes Charolette Forward, MD  mirtazapine (REMERON) 30 MG tablet Take 30 mg by mouth daily.  10/08/18  Yes [provider]  pantoprazole (PROTONIX) 40 MG tablet Take 1  tablet (40 mg total) by mouth daily at 6 (six) AM. 08/11/15  Yes Charolette Forward, MD  rivastigmine (EXELON) 13.3 MG/24HR Place 13.3 mg onto the skin daily.  06/22/19  Yes [provider]  venlafaxine (EFFEXOR) 37.5 MG tablet Take 1 tablet by mouth daily. 01/28/17  Yes [provider]  loperamide (IMODIUM) 2 MG capsule Take 1 capsule (2 mg total) by mouth 4 (four) times daily as needed for diarrhea or loose stools. Patient not taking: Reported on 07/26/2019 04/03/18   Long, Wonda Olds, MD  nitroGLYCERIN (NITROSTAT) 0.4 MG SL tablet Place 1 tablet (0.4 mg total) under the tongue every 5 (five) minutes as needed for chest pain (CP or SOB). 08/11/15   Charolette Forward, MD  ondansetron (ZOFRAN) 8 MG tablet Take 8 mg by mouth every 8 (eight) hours as needed for nausea/vomiting. 03/17/18   [provider]  polyethylene glycol (MIRALAX / GLYCOLAX) packet Take 17 g by mouth daily as needed for mild constipation. Patient not taking: Reported on 07/26/2019 03/24/18   Bonnielee Haff, MD  rivastigmine (EXELON) 9.5 mg/24hr Place 13.3 mg onto the skin daily.     [provider]    Allergies    Patient has no known allergies.  Review of Systems   Review of Systems  Unable to perform ROS: Dementia  Constitutional: Positive for appetite change. Negative for chills and fever.  Respiratory: Negative for cough.   Cardiovascular: Negative for chest pain.  Gastrointestinal: Positive for diarrhea.    Physical Exam Updated Vital Signs BP (!) 148/47 (BP Location: Left Arm)   Pulse 69   Temp 98 F (36.7 C) (Oral)   Resp 15   Ht 5\' 6"  (1.676 m)   Wt 68 kg   SpO2 96%   BMI 24.21 kg/m   Physical Exam Vitals and nursing note reviewed.  Constitutional:      Appearance: She is not ill-appearing or diaphoretic.  HENT:     Head: Normocephalic and atraumatic.  Eyes:     Extraocular Movements: Extraocular movements intact.     Conjunctiva/sclera: Conjunctivae normal.     Pupils:  Pupils are equal, round, and reactive to light.  Cardiovascular:     Rate and Rhythm: Normal rate and regular rhythm.     Pulses: Normal pulses.  Pulmonary:     Effort: Pulmonary effort is normal.     Breath sounds: Normal breath sounds. No wheezing, rhonchi or rales.  Abdominal:     Palpations: Abdomen is soft.     Tenderness: There is no abdominal tenderness. There is no guarding or rebound.  Genitourinary:    Comments: Stage III pressure ulcer noted to sacrum measuring approximately 1 cm in length x 0.8 width x 0.3 depth; no active drainage appreciated  2 dried stage I pressure ulcers to either buttock  along ischial spine Musculoskeletal:     Cervical back: Neck supple.  Skin:    General: Skin is warm and dry.  Neurological:     Mental Status: She is alert.     GCS: GCS eye subscore is 4. GCS verbal subscore is 5. GCS motor subscore is 6.     Cranial Nerves: Cranial nerves are intact.     Motor: Motor function is intact.     Coordination: Coordination is intact. Finger-Nose-Finger Test normal.     Comments: Oriented to person and place. Believes it is 2000.      ED Results / Procedures / Treatments   Labs (all labs ordered are listed, but only abnormal results are displayed) Labs Reviewed  URINALYSIS, ROUTINE W REFLEX MICROSCOPIC - Abnormal; Notable for the following components:      Result Value   APPearance HAZY (*)    Specific Gravity, Urine 1.004 (*)    Hgb urine dipstick SMALL (*)    Nitrite POSITIVE (*)    Leukocytes,Ua MODERATE (*)    WBC, UA >50 (*)    Bacteria, UA MANY (*)    All other components within normal limits  BASIC METABOLIC PANEL - Abnormal; Notable for the following components:   Potassium 2.3 (*)    Chloride 95 (*)    CO2 34 (*)    Glucose, Bld 116 (*)    All other components within normal limits  HEPATIC FUNCTION PANEL - Abnormal; Notable for the following components:   Indirect Bilirubin 1.0 (*)    All other components within normal limits   URINE CULTURE  SARS CORONAVIRUS 2 (TAT 6-24 HRS)  CBC  LACTIC ACID, PLASMA  LACTIC ACID, PLASMA  POC SARS CORONAVIRUS 2 AG -  ED  TROPONIN I (HIGH SENSITIVITY)  TROPONIN I (HIGH SENSITIVITY)    EKG EKG Interpretation  Date/Time:  Monday July 26 2019 13:43:52 EST Ventricular Rate:  68 PR Interval:    QRS Duration: 97 QT Interval:  426 QTC Calculation: 454 R Axis:   31 Text Interpretation: Sinus rhythm Low voltage, precordial leads Borderline T abnormalities, inferior leads U waves present. since last tracing no significant change Confirmed by Eber Hong ([id number]) on 07/26/2019 3:13:24 PM   Radiology CT Head Wo Contrast  Result Date: 07/26/2019 CLINICAL DATA:  History of dimension with mental status changes over the past few days, initial encounter EXAM: CT HEAD WITHOUT CONTRAST TECHNIQUE: Contiguous axial images were obtained from the base of the skull through the vertex without intravenous contrast. COMPARISON:  01/26/2015 FINDINGS: Brain: No evidence of acute infarction, hemorrhage, hydrocephalus, extra-axial collection or mass lesion/mass effect. Mild atrophic and chronic white matter ischemic changes are noted. Vascular: No hyperdense vessel or unexpected calcification. Skull: Normal. Negative for fracture or focal lesion. Sinuses/Orbits: No acute finding. Other: None. IMPRESSION: Chronic atrophic and ischemic changes without acute abnormality. Electronically Signed   By: Alcide Clever M.D.   On: 07/26/2019 15:34   DG Chest Port 1 View  Result Date: 07/26/2019 CLINICAL DATA:  Decreased appetite EXAM: PORTABLE CHEST 1 VIEW COMPARISON:  07/10/2016 FINDINGS: The heart size and mediastinal contours are within normal limits. Small slightly irregular airspace opacities are noted within the right upper lobe. Left lung appears clear. No pleural effusion or pneumothorax. The visualized skeletal structures are unremarkable. IMPRESSION: Small slightly irregular airspace opacities in the  right upper lobe, this may represent developing pneumonia. Electronically Signed   By: Duanne Guess D.O.   On: 07/26/2019 14:57  Procedures Procedures (including critical care time)  Medications Ordered in ED Medications  potassium chloride 10 mEq in 100 mL IVPB (10 mEq Intravenous New Bag/Given 07/26/19 1540)  cefTRIAXone (ROCEPHIN) 1 g in sodium chloride 0.9 % 100 mL IVPB (has no administration in time range)  azithromycin (ZITHROMAX) 500 mg in sodium chloride 0.9 % 250 mL IVPB (has no administration in time range)  sodium chloride 0.9 % bolus 1,000 mL (1,000 mLs Intravenous New Bag/Given 07/26/19 1536)  magnesium sulfate IVPB 2 g 50 mL (2 g Intravenous New Bag/Given 07/26/19 1539)    ED Course  I have reviewed the triage vital signs and the nursing notes.  Pertinent labs & imaging results that were available during my care of the patient were reviewed by me and considered in my medical decision making (see chart for details).  Clinical Course as of Jul 26 1643  Mon Jul 26, 2019  1429 Potassium(!!): 2.3 [MV]  1456 SARS Coronavirus 2 Ag: NEGATIVE [MV]  1502 WBC: 9.7 [MV]  1541 Pneumonia  DG Chest Port 1 View [MV]  1559 Troponin I (High Sensitivity): 14 [MV]  1618 + Nitrites, moderate leuks, > 50 WBCs per HPF.   Urinalysis, Routine w reflex microscopic(!) [MV]  1644 Dr. Konrad Dolores with [facility] agrees to accept patient for admission   [MV]    Clinical Course User Index [MV] Tanda Rockers, PA-C   78 year old female with history of dementia who presents to the ED with son with concern for loss of appetite for the past 4 to 5 days with increased confusion for the past 10 days.  Son reports that patient has been seeing dead relatives recently and he is concerned she could have an infection somewhere.  His wife recently did have Covid approximately 15 days ago but he tested negative and he kept his mother away from his wife does not believe she has Covid.  She did have  some diarrhea recently which resolved.  She is pleasantly demented on exam and cannot provide much information.  She is denying pain anywhere.  Her vitals are stable today.  She is afebrile without tachycardia or tachypnea.  She is able to follow commands easily and her neuro exam is reassuring despite patient not knowing what year it is or who the president is.  Do not suspect stroke.  CBC and BMP were obtained while patient was in the waiting room.  Potassium 2.3 today.  Will replete.  Will order mag as well.  No leukocytosis on CBC.  Work-up further for infection.  Urinalysis and chest x-ray obtained.  Will obtain CT head with concern for hallucinations although suspect this is likely more due to infection.   CT head negative.  Chest x-ray with some concern for a right upper lobe pneumonia. Will order zithromax and rocephin at this time. Still awaiting urinalysis.   U/A with gross infection today. Already receiving rocephin to cover for the pneumonia on CXR. Urine culture sent. Will admit pt at this time given significant hypokalemia.  MDM Rules/Calculators/A&P                       Final Clinical Impression(s) / ED Diagnoses Final diagnoses:  Acute cystitis without hematuria  Hypokalemia  Community acquired pneumonia of right upper lobe of lung  Pressure injury of sacral region, stage 3 [facility])    Rx / DC Orders ED Discharge Orders    None       Tanda Rockers, PA-C 07/26/19 1645  Eber HongMiller, Brian, MD 07/26/19 [phone]

## 2019-07-26 NOTE — ED Triage Notes (Signed)
Son with pt due to dementia.  Son reports pt has not been eating or drinking for to past 4-5 days.  Reports  Sores to both cheeks and sacral area.  Son has been treating this area about 10 days.  PCP is aware and not able to get in to see PCP.

## 2019-07-26 NOTE — ED Notes (Signed)
CRITICAL VALUE ALERT  Critical Value:  K, 2.3 Date & Time Notied:  07/26/2019, 1306  Provider Notified: Audelia Acton PA  Orders Received/Actions taken: see orders

## 2019-07-26 NOTE — Telephone Encounter (Signed)
Patient's son called needing to let Dr. Delice Lesch know that he had to take Tiffany Mack to the ER on Christmas Day. They were thinking that she had a UTI. He is calling to see if he can get her lab results and let Dr. Delice Lesch know that she has had a lot of confusion and sleeping a lot and he believes she has some bed ulcers. He's not sure if he should take her back to the ER? Please Call. Thank you

## 2019-07-27 DIAGNOSIS — F0281 Dementia in other diseases classified elsewhere with behavioral disturbance: Secondary | ICD-10-CM

## 2019-07-27 DIAGNOSIS — F329 Major depressive disorder, single episode, unspecified: Secondary | ICD-10-CM

## 2019-07-27 LAB — FERRITIN: Ferritin: 125 ng/mL (ref 11–307)

## 2019-07-27 LAB — BASIC METABOLIC PANEL
Anion gap: 14 (ref 5–15)
BUN: 6 mg/dL — ABNORMAL LOW (ref 8–23)
CO2: 29 mmol/L (ref 22–32)
Calcium: 8.3 mg/dL — ABNORMAL LOW (ref 8.9–10.3)
Chloride: 101 mmol/L (ref 98–111)
Creatinine, Ser: 0.55 mg/dL (ref 0.44–1.00)
GFR calc Af Amer: >60 mL/min (ref >60)
GFR calc non Af Amer: >60 mL/min (ref >60)
Glucose, Bld: 103 mg/dL — ABNORMAL HIGH (ref 70–99)
Potassium: 2.5 mmol/L — CL (ref 3.5–5.1)
Sodium: 144 mmol/L (ref 135–145)

## 2019-07-27 LAB — CBC
HCT: 37.9 % (ref 36.0–46.0)
Hemoglobin: 11.8 g/dL — ABNORMAL LOW (ref 12.0–15.0)
MCH: 27 pg (ref 26.0–34.0)
MCHC: 31.1 g/dL (ref 30.0–36.0)
MCV: 86.7 fL (ref 80.0–100.0)
Platelets: 256 10*3/uL (ref 150–400)
RBC: 4.37 MIL/uL (ref 3.87–5.11)
RDW: 12.7 % (ref 11.5–15.5)
WBC: 8.1 10*3/uL (ref 4.0–10.5)
nRBC: 0 % (ref 0.0–0.2)

## 2019-07-27 LAB — FIBRINOGEN: Fibrinogen: 609 mg/dL — ABNORMAL HIGH (ref 210–475)

## 2019-07-27 LAB — LACTATE DEHYDROGENASE: LDH: 202 U/L — ABNORMAL HIGH (ref 98–192)

## 2019-07-27 LAB — PHOSPHORUS: Phosphorus: 2.5 mg/dL (ref 2.5–4.6)

## 2019-07-27 LAB — C-REACTIVE PROTEIN: CRP: 2.9 mg/dL — ABNORMAL HIGH (ref <1.0)

## 2019-07-27 LAB — ABO/RH: ABO/RH(D): B POS

## 2019-07-27 LAB — SARS CORONAVIRUS 2 (TAT 6-24 HRS): SARS Coronavirus 2: POSITIVE — AB

## 2019-07-27 LAB — D-DIMER, QUANTITATIVE: D-Dimer, Quant: 1.19 ug/mL-FEU — ABNORMAL HIGH (ref 0.00–0.50)

## 2019-07-27 LAB — PROCALCITONIN: Procalcitonin: <0.1 ng/mL

## 2019-07-27 LAB — MAGNESIUM: Magnesium: 2.5 mg/dL — ABNORMAL HIGH (ref 1.7–2.4)

## 2019-07-27 MED ORDER — SACCHAROMYCES BOULARDII 250 MG PO CAPS
250.0000 mg | ORAL_CAPSULE | Freq: Two times a day (BID) | ORAL | Status: DC
  Administered 2019-07-27 – 2019-07-29 (×5): 250 mg via ORAL
  Filled 2019-07-27 (×5): qty 1

## 2019-07-27 MED ORDER — SODIUM CHLORIDE 0.9 % IV SOLN: INTRAVENOUS | Status: DC

## 2019-07-27 MED ORDER — ASCORBIC ACID 500 MG PO TABS
500.0000 mg | ORAL_TABLET | Freq: Every day | ORAL | Status: DC
  Administered 2019-07-27 – 2019-07-29 (×3): 500 mg via ORAL
  Filled 2019-07-27 (×3): qty 1

## 2019-07-27 MED ORDER — POTASSIUM CHLORIDE CRYS ER 20 MEQ PO TBCR
40.0000 meq | EXTENDED_RELEASE_TABLET | ORAL | Status: AC
  Administered 2019-07-27 (×2): 40 meq via ORAL
  Filled 2019-07-27 (×2): qty 2

## 2019-07-27 MED ORDER — DEXAMETHASONE 4 MG PO TABS
6.0000 mg | ORAL_TABLET | Freq: Every day | ORAL | Status: DC
  Administered 2019-07-28: 6 mg via ORAL
  Filled 2019-07-27: qty 2

## 2019-07-27 MED ORDER — POTASSIUM CHLORIDE IN NACL 40-0.9 MEQ/L-% IV SOLN
INTRAVENOUS | Status: DC
  Administered 2019-07-27 – 2019-07-28 (×3): 75 mL/h via INTRAVENOUS

## 2019-07-27 MED ORDER — ZINC SULFATE 220 (50 ZN) MG PO CAPS
220.0000 mg | ORAL_CAPSULE | Freq: Every day | ORAL | Status: DC
  Administered 2019-07-27 – 2019-07-29 (×3): 220 mg via ORAL
  Filled 2019-07-27 (×3): qty 1

## 2019-07-27 MED ORDER — DEXAMETHASONE 4 MG PO TABS
10.0000 mg | ORAL_TABLET | Freq: Every day | ORAL | Status: AC
  Administered 2019-07-27: 10 mg via ORAL
  Filled 2019-07-27: qty 3

## 2019-07-27 MED ORDER — BOOST / RESOURCE BREEZE PO LIQD CUSTOM
1.0000 | Freq: Three times a day (TID) | ORAL | Status: DC
  Administered 2019-07-27 – 2019-07-29 (×7): 1 via ORAL

## 2019-07-27 MED ORDER — VENLAFAXINE HCL ER 37.5 MG PO CP24
37.5000 mg | ORAL_CAPSULE | Freq: Every day | ORAL | Status: DC
  Administered 2019-07-27 – 2019-07-29 (×3): 37.5 mg via ORAL
  Filled 2019-07-27 (×4): qty 1

## 2019-07-27 NOTE — Plan of Care (Signed)
  Problem: Education: Goal: Knowledge of risk factors and measures for prevention of condition will improve Outcome: Progressing   Problem: Respiratory: Goal: Will maintain a patent airway Outcome: Progressing Goal: Complications related to the disease process, condition or treatment will be avoided or minimized Outcome: Progressing   Problem: Education: Goal: Knowledge of General Education information will improve Description: Including pain rating scale, medication(s)/side effects and non-pharmacologic comfort measures Outcome: Progressing   Problem: Health Behavior/Discharge Planning: Goal: Ability to manage health-related needs will improve Outcome: Progressing   Problem: Clinical Measurements: Goal: Ability to maintain clinical measurements within normal limits will improve Outcome: Progressing Goal: Will remain free from infection Outcome: Progressing Goal: Diagnostic test results will improve Outcome: Progressing Goal: Respiratory complications will improve Outcome: Progressing Goal: Cardiovascular complication will be avoided Outcome: Progressing   Problem: Activity: Goal: Risk for activity intolerance will decrease Outcome: Progressing   Problem: Nutrition: Goal: Adequate nutrition will be maintained Outcome: Progressing   Problem: Coping: Goal: Level of anxiety will decrease Outcome: Progressing   Problem: Elimination: Goal: Will not experience complications related to bowel motility Outcome: Progressing Goal: Will not experience complications related to urinary retention Outcome: Progressing   Problem: Pain Managment: Goal: General experience of comfort will improve Outcome: Progressing   Problem: Safety: Goal: Ability to remain free from injury will improve Outcome: Progressing   Problem: Skin Integrity: Goal: Risk for impaired skin integrity will decrease Outcome: Progressing   

## 2019-07-27 NOTE — Progress Notes (Signed)
CRITICAL VALUE ALERT  Critical Value:  Potassium 2.3  Date & Time Notied:  07/27/19 @ 0735  Provider Notified: Dr. Dyann Kief  Orders Received/Actions taken: see orders

## 2019-07-27 NOTE — Progress Notes (Signed)
PROGRESS NOTE    Tiffany Mack  HKV:[id number] DOB: [birth year] DOA: 07/26/2019 PCP: Jonathon Jordan, MD     Brief Narrative:  78 y.o. female with medical history significant of Anxiety, Dementia, and CAD, HTN, GERD, depression, HLD. Pt presents w/ son who provides history due to pts confusion. Pt simply states she doesn't feel well and has diarrhea. No other acute complaints. Son states that she has had diarrhea for the past 4-5 days with marked worsening over the last 2 days. Over this period of time she has bewcome more and more confused. States she has been seeing a dead familyu member off and on for the past 10 days. Pt has had difficulty w/ urination over the last couple of days. Pt has spent an increased amount of time and has developed bed sores per pts son and caregiver.    Assessment & Plan: 1-acute metabolic encephalopathy -In the setting of UTI and COVID-19 infection -Continue IV fluids -continue IV antibiotics-follow culture results and sensitivity -Start patient on steroids and follow inflammatory markers. -Follow clinical response, constant reorientation and continue supportive care.  2-COVID-19 infection -Start steroids -Follow-up to monitor markers -No meeting criteria for remdesivir -No hypoxia. -Continue vitamin C and zinc.  3-acute lower UTI -UA demonstrated infection -Continue empiric use of Rocephin -Follow urine culture results and sensitivity -Continue IV fluids.  4-hypokalemia/dehydration -Continue IV fluids -Replete electrolytes follow trend -Magnesium within normal limits.  5-sacral decubitus ulcer -Present on admission -Stage III -No signs of superimposed infection -Continue preventive measures and wound care recommendations.  6-physical deconditioning -Appreciate physical therapy evaluation -Patient near baseline functioning -Recommended supervision/observation for mobility and out of bed intermittently at time of discharge. -No home  health services or placement needed at this moment.  7-overall history of bipolar disorder and dementia -No suicidal ideation or hallucination -Currently no agitation -Pleasantly confused and intermittently oriented x1-2 -Continue constant reorientation -Continue Remeron, Exelon patch and Effexor.  8-hypertension -Continue metoprolol.   DVT prophylaxis: Lovenox Code Status: Full code Family Communication: No family at bedside. Disposition Plan: Remains inpatient, continue IV fluids, continue electrolyte repletion and follow trend. Start Steroids management and continue monitoring inflammatory markers.  No meeting criteria for remdesivir initiation.  Consultants:   None  Procedures:   See below for x-ray report  Antimicrobials:  Anti-infectives (From admission, onward)   Start     Dose/Rate Route Frequency Ordered Stop   07/27/19 1800  azithromycin (ZITHROMAX) 250 mg in dextrose 5 % 125 mL IVPB     250 mg 125 mL/hr over 60 Minutes Intravenous Every 24 hours 07/26/19 2006 07/31/19 1759   07/27/19 1700  cefTRIAXone (ROCEPHIN) 1 g in sodium chloride 0.9 % 100 mL IVPB     1 g 200 mL/hr over 30 Minutes Intravenous Every 24 hours 07/26/19 2006     07/26/19 1545  cefTRIAXone (ROCEPHIN) 1 g in sodium chloride 0.9 % 100 mL IVPB     1 g 200 mL/hr over 30 Minutes Intravenous  Once 07/26/19 1535 07/26/19 1817   07/26/19 1545  azithromycin (ZITHROMAX) 500 mg in sodium chloride 0.9 % 250 mL IVPB     500 mg 250 mL/hr over 60 Minutes Intravenous  Once 07/26/19 1535 07/26/19 2302     Subjective: Afebrile, good oxygen saturation on room air, pleasantly confused; decreased oral intake.  Objective: Vitals:   07/26/19 1815 07/26/19 2300 07/27/19 0522 07/27/19 1400  BP:  128/60 (!) 140/44 (!) 128/51  Pulse: 72 88 81 72  Resp:  20 18  20  Temp:   98.3 F (36.8 C) 98.3 F (36.8 C)  TempSrc:   Oral Oral  SpO2: 95% 96% 99% 94%  Weight:      Height:        Intake/Output Summary (Last  24 hours) at 07/27/2019 1550 Last data filed at 07/27/2019 1103 Gross per 24 hour  Intake 818.81 ml  Output --  Net 818.81 ml   Filed Weights   07/26/19 1128  Weight: 68 kg    Examination: General exam: Alert, awake, oriented x 1-2 intermittently; pleasantly confused; no agitation.  Decreased oral intake.  Currently afebrile.  No chest pain.  Good oxygen saturation on room air Respiratory system: No crackles, no wheezing, no using accessory muscle.  Good air movement bilaterally. Respiratory effort normal. Cardiovascular system:RRR. No murmurs, rubs, gallops. Gastrointestinal system: Abdomen is nondistended, soft and nontender. No organomegaly or masses felt. Normal bowel sounds heard. Central nervous system: No focal neurological deficits. Extremities: No cyanosis or clubbing. Skin: No petechiae; stage III coccyx sacral wound.  Present prior to admission.  No active drainage. Psychiatry: Impaired insight with underlying history of dementia and acute metabolic encephalopathy.  Mood & affect appropriate.    Data Reviewed: I have personally reviewed following labs and imaging studies  CBC: Recent Labs  Lab 07/23/19 1832 07/26/19 1158 07/27/19 0607  WBC 7.8 9.7 8.1  HGB 12.9 13.2 11.8*  HCT 39.6 41.5 37.9  MCV 83.7 85.6 86.7  PLT 253 293 256   Basic Metabolic Panel: Recent Labs  Lab 07/23/19 1832 07/26/19 1158 07/27/19 0607  NA 143 141 144  K 2.6* 2.3* 2.5*  CL 100 95* 101  CO2 32 34* 29  GLUCOSE 112* 116* 103*  BUN 9 11 6*  CREATININE 0.81 0.77 0.55  CALCIUM 9.1 9.0 8.3*  MG  --   --  2.5*  PHOS  --   --  2.5   GFR: Estimated Creatinine Clearance: 54.3 mL/min (by C-G formula based on SCr of 0.55 mg/dL).   Liver Function Tests: Recent Labs  Lab 07/26/19 1158  AST 28  ALT 17  ALKPHOS 96  BILITOT 1.2  PROT 7.3  ALBUMIN 3.5   Anemia Panel: Recent Labs    07/27/19 0912  FERRITIN 125   Urine analysis:    Component Value Date/Time   COLORURINE  YELLOW 07/26/2019 1522   APPEARANCEUR HAZY (A) 07/26/2019 1522   LABSPEC 1.004 (L) 07/26/2019 1522   PHURINE 6.0 07/26/2019 1522   GLUCOSEU NEGATIVE 07/26/2019 1522   HGBUR SMALL (A) 07/26/2019 1522   BILIRUBINUR NEGATIVE 07/26/2019 1522   KETONESUR NEGATIVE 07/26/2019 1522   PROTEINUR NEGATIVE 07/26/2019 1522   NITRITE POSITIVE (A) 07/26/2019 1522   LEUKOCYTESUR MODERATE (A) 07/26/2019 1522    Recent Results (from the past 240 hour(s))  Urine culture     Status: Abnormal (Preliminary result)   Collection Time: 07/26/19  3:22 PM   Specimen: Urine, Clean Catch  Result Value Ref Range Status   Specimen Description   Final    URINE, CLEAN CATCH Performed at [facility], [address], [place], [place] [address]    Special Requests   Final    NONE Performed at [facility], [address], [place], [place] [address]    Culture >=100,000 COLONIES/mL ESCHERICHIA COLI (A)  Final   Report Status PENDING  Incomplete  SARS CORONAVIRUS 2 (TAT 6-24 HRS) Nasopharyngeal Nasopharyngeal Swab     Status: Abnormal   Collection Time: 07/26/19  6:30 PM  Specimen: Nasopharyngeal Swab  Result Value Ref Range Status   SARS Coronavirus 2 PVivi MartensE (A) NEGATIVE Final    Comment: RESULT CALLED TO, READ BACK BY AND VERIFIED WITH: E. Wright RN 14:55 07/27/19 ([email or web address]) (NOTE) SARS-CoV-2 target nucleic acids are DETECTED. The SARS-CoV-2 RNA is generally detectable in upper and lower respiratory specimens during the acute phase of infection. Positive results are indicative of the presence of SARS-CoV-2 RNA. Clinical correlation with patient history and other diagnostic information is  necessary to determine patient infection status. Positive results do not rule out bacterial infection or co-infection with other viruses.  The expected result is Negative. Fact Sheet for Patients: HairSlick.no Fact Sheet for Healthcare  Providers: quierodirigir.com This test is not yet approved or cleared by the [place] FDA and  has been authorized for detection and/or diagnosis of SARS-CoV-2 by FDA under an Emergency Use Authorization (EUA). This EUA will remain  in effect (meaning this test can be used) for t he duration of the COVID-19 declaration under Section 564(b)(1) of the Act, 21 U.S.C. section 360bbb-3(b)(1), unless the authorization is terminated or revoked sooner. Performed at [facility], [address] [place], [place] [address]   Respiratory Panel by RT PCR (Flu A&B, Covid) - Nasopharyngeal Swab     Status: Abnormal   Collection Time: 07/26/19 10:10 PM   Specimen: Nasopharyngeal Swab  Result Value Ref Range Status   SARS Coronavirus 2 by RT PCR POSITIVE (A) NEGATIVE Final    Comment: RESULT CALLED TO, READ BACK BY AND VERIFIED WITH: NICHOLS,K. AT 2304 ON 07/26/2019 BY EVA (NOTE) SARS-CoV-2 target nucleic acids are DETECTED. SARS-CoV-2 RNA is generally detectable in upper respiratory specimens  during the acute phase of infection. Positive results are indicative of the presence of the identified virus, but do not rule out bacterial infection or co-infection with other pathogens not detected by the test. Clinical correlation with patient history and other diagnostic information is necessary to determine patient infection status. The expected result is Negative. Fact Sheet for Patients:  https://www.moore.com/ Fact Sheet for Healthcare Providers: https://www.young.biz/ This test is not yet approved or cleared by the [place] FDA and  has been authorized for detection and/or diagnosis of SARS-CoV-2 by FDA under an Emergency Use Authorization (EUA).  This EUA will remain in effect (meaning this test can be use d) for the duration of  the COVID-19 declaration under Section 564(b)(1) of the Act, 21 U.S.C. section  360bbb-3(b)(1), unless the authorization is terminated or revoked sooner.    Influenza A by PCR NEGATIVE NEGATIVE Final   Influenza B by PCR NEGATIVE NEGATIVE Final    Comment: (NOTE) The Xpert Xpress SARS-CoV-2/FLU/RSV assay is intended as an aid in  the diagnosis of influenza from Nasopharyngeal swab specimens and  should not be used as a sole basis for treatment. Nasal washings and  aspirates are unacceptable for Xpert Xpress SARS-CoV-2/FLU/RSV  testing. Fact Sheet for Patients: https://www.moore.com/ Fact Sheet for Healthcare Providers: https://www.young.biz/ This test is not yet approved or cleared by the [place] FDA and  has been authorized for detection and/or diagnosis of SARS-CoV-2 by  FDA under an Emergency Use Authorization (EUA). This EUA will remain  in effect (meaning this test can be used) for the duration of the  Covid-19 declaration under Section 564(b)(1) of the Act, 21  U.S.C. section 360bbb-3(b)(1), unless the authorization is  terminated or revoked. Performed at [facility], [address], [place], [place] [address]      Radiology  Studies: CT Head Wo Contrast  Result Date: 07/26/2019 CLINICAL DATA:  History of dimension with mental status changes over the past few days, initial encounter EXAM: CT HEAD WITHOUT CONTRAST TECHNIQUE: Contiguous axial images were obtained from the base of the skull through the vertex without intravenous contrast. COMPARISON:  01/26/2015 FINDINGS: Brain: No evidence of acute infarction, hemorrhage, hydrocephalus, extra-axial collection or mass lesion/mass effect. Mild atrophic and chronic white matter ischemic changes are noted. Vascular: No hyperdense vessel or unexpected calcification. Skull: Normal. Negative for fracture or focal lesion. Sinuses/Orbits: No acute finding. Other: None. IMPRESSION: Chronic atrophic and ischemic changes without acute abnormality. Electronically Signed   By: Alcide CleverMark   Lukens M.D.   On: 07/26/2019 15:34   DG Chest Port 1 View  Result Date: 07/26/2019 CLINICAL DATA:  Decreased appetite EXAM: PORTABLE CHEST 1 VIEW COMPARISON:  07/10/2016 FINDINGS: The heart size and mediastinal contours are within normal limits. Small slightly irregular airspace opacities are noted within the right upper lobe. Left lung appears clear. No pleural effusion or pneumothorax. The visualized skeletal structures are unremarkable. IMPRESSION: Small slightly irregular airspace opacities in the right upper lobe, this may represent developing pneumonia. Electronically Signed   By: Duanne GuessNicholas  Plundo D.O.   On: 07/26/2019 14:57    Scheduled Meds: . vitamin C  500 mg Oral Daily  . aspirin EC  81 mg Oral Daily  . [START ON 07/28/2019] dexamethasone  6 mg Oral Daily  . enoxaparin (LOVENOX) injection  40 mg Subcutaneous Q24H  . feeding supplement  1 Container Oral TID BM  . memantine  10 mg Oral BID  . metoprolol tartrate  12.5 mg Oral Daily  . mirtazapine  30 mg Oral Daily  . pantoprazole  40 mg Oral Q0600  . rivastigmine  13.3 mg Transdermal Daily  . saccharomyces boulardii  250 mg Oral BID  . venlafaxine XR  37.5 mg Oral Q breakfast  . zinc sulfate  220 mg Oral Daily   Continuous Infusions: . 0.9 % NaCl with KCl 40 mEq / L 75 mL/hr (07/27/19 1007)  . azithromycin    . cefTRIAXone (ROCEPHIN)  IV       LOS: 1 day    Time spent: 35 minutes. Greater than 50% of this time was spent in direct contact with the patient, coordinating care and discussing relevant ongoing clinical issues, including COVID-19 infection, UTI acute metabolic encephalopathy.  Patient pleasantly confused and demonstrating poor insight.  No agitation.  Still with abnormal electrolytes (potassium level) having changes on chest x-ray 8 suggesting viral pneumonia.  Good oxygen saturation on room air.    Vassie Lollarlos Zaliyah Meikle, MD [facility] Pager [phone]  07/27/2019, 3:50 PM

## 2019-07-27 NOTE — Evaluation (Signed)
Physical Therapy Evaluation Patient Details Name: Tiffany Mack MRN: [id number] DOB: [birth year] Today's Date: 07/27/2019   History of Present Illness  Tiffany Mack is a 78 y.o. female with medical history significant of Anxiety, Dementia, and CAD, HTN, GERD, depression, HLD. Pt presents w/ son who provides history due to pts confusion. Pt simply states she doesn't feel well and has diarrhea. No other acute complaints. Son states that she has had diarrhea for the past 4-5 days with marked worsening over the last 2 days. Over this period of time she has bewcome more and more confused. States she has been seeing a dead familyu member off and on for the past 10 days. Pt has had difficulty w/ urination over the last couple of days. Pt has spent an increased amount of time and has developed bed sores per pts son and caregiver.    Clinical Impression  Patient functioning near baseline for functional mobility and gait, demonstrates good return for ambulating in room using RW or without AD, no loss of balance, limited secondary to fatigue.  Patient tolerated sitting up in chair after therapy - RN notified.  Patient will benefit from continued physical therapy in hospital and recommended venue below to increase strength, balance, endurance for safe ADLs and gait.      Follow Up Recommendations No PT follow up;Supervision for mobility/OOB;Supervision - Intermittent    Equipment Recommendations  None recommended by PT    Recommendations for Other Services       Precautions / Restrictions Precautions Precautions: Fall Restrictions Weight Bearing Restrictions: No      Mobility  Bed Mobility Overal bed mobility: Modified Independent             General bed mobility comments: increased time  Transfers Overall transfer level: Needs assistance Equipment used: Rolling walker (2 wheeled);None Transfers: Sit to/from [facility] Sit to Stand: Supervision Stand  pivot transfers: Supervision;Min guard       General transfer comment: slightly labored, increased time  Ambulation/Gait Ambulation/Gait assistance: Supervision;Min guard Gait Distance (Feet): 40 Feet Assistive device: Rolling walker (2 wheeled);None Gait Pattern/deviations: Decreased step length - right;Decreased step length - left;Decreased stride length Gait velocity: decreased   General Gait Details: slightly labored cadence without use of AD, no loss of balance, increased endurance when using RW  Stairs            Wheelchair Mobility    Modified Rankin (Stroke Patients Only)       Balance Overall balance assessment: Mild deficits observed, not formally tested                                           Pertinent Vitals/Pain Pain Assessment: Faces Faces Pain Scale: Hurts a little bit Pain Location: tail bone area/buttocks Pain Descriptors / Indicators: Sore Pain Intervention(s): Limited activity within patient's tolerance;Monitored during session    Home Living Family/patient expects to be discharged to:: Private residence Living Arrangements: Children Available Help at Discharge: Family;Available PRN/intermittently Type of Home: House Home Access: Level entry     Home Layout: One level Home Equipment: Walker - 2 wheels;Cane - single point;Shower seat;Bedside commode Additional Comments: all info from previous notes, patient is a poor historian    Prior Function Level of Independence: Needs assistance   Gait / Transfers Assistance Needed: household ambulator without AD, uses SPC PRN when outside  ADL's / [place] Needed:  assisted by family        Hand Dominance   Dominant Hand: Right    Extremity/Trunk Assessment   Upper Extremity Assessment Upper Extremity Assessment: Defer to OT evaluation    Lower Extremity Assessment Lower Extremity Assessment: Generalized weakness    Cervical / Trunk Assessment Cervical  / Trunk Assessment: Normal  Communication   Communication: No difficulties  Cognition Arousal/Alertness: Awake/alert Behavior During Therapy: WFL for tasks assessed/performed;Impulsive Overall Cognitive Status: History of cognitive impairments - at baseline                                 General Comments: follows directions consistently most of time, occasional impulsive behavior      General Comments      Exercises     Assessment/Plan    PT Assessment Patient needs continued PT services  PT Problem List Decreased strength;Decreased activity tolerance;Decreased balance;Decreased mobility       PT Treatment Interventions Balance training;Gait training;Stair training;Functional mobility training;Therapeutic activities;Therapeutic exercise;Patient/family education    PT Goals (Current goals can be found in the Care Plan section)  Acute Rehab PT Goals Patient Stated Goal: return home with family to assist PT Goal Formulation: With patient Time For Goal Achievement: 08/03/19 Potential to Achieve Goals: Good    Frequency Min 2X/week   Barriers to discharge        Co-evaluation               AM-PAC PT "6 Clicks" Mobility  Outcome Measure Help needed turning from your back to your side while in a flat bed without using bedrails?: None Help needed moving from lying on your back to sitting on the side of a flat bed without using bedrails?: None Help needed moving to and from a bed to a chair (including a wheelchair)?: A Little Help needed standing up from a chair using your arms (e.g., wheelchair or bedside chair)?: None Help needed to walk in hospital room?: A Little Help needed climbing 3-5 steps with a railing? : A Little 6 Click Score: 21    End of Session   Activity Tolerance: Patient tolerated treatment well;Patient limited by fatigue Patient left: in chair;with call bell/phone within reach;with chair alarm set Nurse Communication: Mobility  status PT Visit Diagnosis: Unsteadiness on feet (R26.81);Other abnormalities of gait and mobility (R26.89);Muscle weakness (generalized) (M62.81)    Time: 4098-1191 PT Time Calculation (min) (ACUTE ONLY): 28 min   Charges:   PT Evaluation $PT Eval Moderate Complexity: 1 Mod PT Treatments $Therapeutic Activity: 23-37 mins        2:23 PM, 07/27/19 Ocie Bob, MPT Physical Therapist with [facility] [phone] office [phone] mobile phone

## 2019-07-27 NOTE — Plan of Care (Signed)
  Problem: Acute Rehab PT Goals(only PT should resolve) Goal: Pt Will Go Supine/Side To Sit Outcome: Progressing Flowsheets (Taken 07/27/2019 1426) Pt will go Supine/Side to Sit: Independently Goal: Patient Will Transfer Sit To/From Stand Outcome: Progressing Flowsheets (Taken 07/27/2019 1426) Patient will transfer sit to/from stand: with modified independence Goal: Pt Will Transfer Bed To Chair/Chair To Bed Outcome: Progressing Flowsheets (Taken 07/27/2019 1426) Pt will Transfer Bed to Chair/Chair to Bed: with modified independence Goal: Pt Will Ambulate Outcome: Progressing Flowsheets (Taken 07/27/2019 1426) Pt will Ambulate:  100 feet  with supervision  with rolling walker   2:26 PM, 07/27/19 Lonell Grandchild, MPT Physical Therapist with [facility] [phone] office [phone] mobile phone

## 2019-07-28 DIAGNOSIS — N390 Urinary tract infection, site not specified: Secondary | ICD-10-CM

## 2019-07-28 DIAGNOSIS — J1289 Other viral pneumonia: Secondary | ICD-10-CM

## 2019-07-28 DIAGNOSIS — L89153 Pressure ulcer of sacral region, stage 3: Secondary | ICD-10-CM

## 2019-07-28 DIAGNOSIS — N3000 Acute cystitis without hematuria: Secondary | ICD-10-CM

## 2019-07-28 DIAGNOSIS — J1282 Pneumonia due to coronavirus disease 2019: Secondary | ICD-10-CM

## 2019-07-28 DIAGNOSIS — U071 COVID-19: Secondary | ICD-10-CM

## 2019-07-28 LAB — CBC WITH DIFFERENTIAL/PLATELET
Abs Immature Granulocytes: 0.07 10*3/uL (ref 0.00–0.07)
Basophils Absolute: 0 10*3/uL (ref 0.0–0.1)
Basophils Relative: 0 %
Eosinophils Absolute: 0 10*3/uL (ref 0.0–0.5)
Eosinophils Relative: 0 %
HCT: 36.2 % (ref 36.0–46.0)
Hemoglobin: 11.4 g/dL — ABNORMAL LOW (ref 12.0–15.0)
Immature Granulocytes: 1 %
Lymphocytes Relative: 14 %
Lymphs Abs: 1.4 10*3/uL (ref 0.7–4.0)
MCH: 27 pg (ref 26.0–34.0)
MCHC: 31.5 g/dL (ref 30.0–36.0)
MCV: 85.8 fL (ref 80.0–100.0)
Monocytes Absolute: 0.5 10*3/uL (ref 0.1–1.0)
Monocytes Relative: 5 %
Neutro Abs: 7.9 10*3/uL — ABNORMAL HIGH (ref 1.7–7.7)
Neutrophils Relative %: 80 %
Platelets: 266 10*3/uL (ref 150–400)
RBC: 4.22 MIL/uL (ref 3.87–5.11)
RDW: 12.6 % (ref 11.5–15.5)
WBC: 9.9 10*3/uL (ref 4.0–10.5)
nRBC: 0 % (ref 0.0–0.2)

## 2019-07-28 LAB — FERRITIN: Ferritin: 113 ng/mL (ref 11–307)

## 2019-07-28 LAB — PHOSPHORUS: Phosphorus: 2.2 mg/dL — ABNORMAL LOW (ref 2.5–4.6)

## 2019-07-28 LAB — C-REACTIVE PROTEIN: CRP: 1.6 mg/dL — ABNORMAL HIGH (ref <1.0)

## 2019-07-28 LAB — COMPREHENSIVE METABOLIC PANEL
ALT: 14 U/L (ref 0–44)
AST: 16 U/L (ref 15–41)
Albumin: 2.9 g/dL — ABNORMAL LOW (ref 3.5–5.0)
Alkaline Phosphatase: 80 U/L (ref 38–126)
Anion gap: 10 (ref 5–15)
BUN: 7 mg/dL — ABNORMAL LOW (ref 8–23)
CO2: 26 mmol/L (ref 22–32)
Calcium: 8.7 mg/dL — ABNORMAL LOW (ref 8.9–10.3)
Chloride: 109 mmol/L (ref 98–111)
Creatinine, Ser: 0.53 mg/dL (ref 0.44–1.00)
GFR calc Af Amer: >60 mL/min (ref >60)
GFR calc non Af Amer: >60 mL/min (ref >60)
Glucose, Bld: 119 mg/dL — ABNORMAL HIGH (ref 70–99)
Potassium: 3.9 mmol/L (ref 3.5–5.1)
Sodium: 145 mmol/L (ref 135–145)
Total Bilirubin: 0.7 mg/dL (ref 0.3–1.2)
Total Protein: 6.2 g/dL — ABNORMAL LOW (ref 6.5–8.1)

## 2019-07-28 LAB — URINE CULTURE: Culture: >=100000 — AB

## 2019-07-28 LAB — MAGNESIUM: Magnesium: 2.3 mg/dL (ref 1.7–2.4)

## 2019-07-28 MED ORDER — SODIUM CHLORIDE 0.9 % IV SOLN
500.0000 mg | INTRAVENOUS | Status: DC
  Administered 2019-07-28: 500 mg via INTRAVENOUS
  Filled 2019-07-28: qty 500

## 2019-07-28 MED ORDER — OCUVITE-LUTEIN PO CAPS
1.0000 | ORAL_CAPSULE | Freq: Every day | ORAL | Status: DC
  Administered 2019-07-28 – 2019-07-29 (×2): 1 via ORAL
  Filled 2019-07-28 (×2): qty 1

## 2019-07-28 MED ORDER — PRO-STAT SUGAR FREE PO LIQD
30.0000 mL | Freq: Three times a day (TID) | ORAL | Status: DC
  Administered 2019-07-28 – 2019-07-29 (×3): 30 mL via ORAL
  Filled 2019-07-28 (×3): qty 30

## 2019-07-28 MED ORDER — POTASSIUM PHOSPHATE MONOBASIC 500 MG PO TABS
500.0000 mg | ORAL_TABLET | Freq: Three times a day (TID) | ORAL | Status: DC
  Filled 2019-07-28 (×3): qty 1

## 2019-07-28 NOTE — Progress Notes (Signed)
PROGRESS NOTE  Tiffany Mack HEN:[id number] DOB: Nov 29, 1940 DOA: 07/26/2019 PCP: Mila Palmer, MD  Brief History:  78 y.o.femalewith medical history significant ofAnxiety, Dementia, and CAD, HTN, GERD, depression, HLD. Pt presents w/ son who provides history due to pts confusion. Pt simply states she doesn't feel well and has diarrhea. No other acute complaints. Son states that she has had diarrhea for the past 4-5 days with marked worsening over the last 2 days. Over this period of time she has bewcome more and more confused. States she has been seeing a dead familyu member off and on for the past 10 days. Pt has had difficultyw/ urination over the last couple of days. Pt has spent an increased amount of time and has developed bed sores per pts son and caregiver.  Assessment/Plan: acute metabolic encephalopathy -In the setting of UTI and COVID-19 infection -Continue IV fluids -continue IV antibiotics-follow culture results and sensitivity -Started patient on steroids and follow inflammatory markers. -d/c steroids as pt has not had dyspnea or hypoxia  COVID-19 pneumonia -Discontinue steroids as pt has not had any hypoxia or dyspnea -Follow-up to monitor markers -No meeting criteria for remdesivir -No hypoxia. -Continue vitamin C and zinc.  acute lower UTI -UA demonstrated infection -Continued ceftriaxone -culture with pansensitive Ecoli -Continue IV fluids.  hypokalemia/dehydration -Continue IV fluids -Replete electrolytes follow trend -Magnesium within normal limits.  sacral decubitus ulcer -Present on admission -Stage III -No signs of superimposed infection -Continue preventive measures and wound care recommendations.  physical deconditioning -Appreciate physical therapy evaluation--no PT followup recommended -Patient near baseline functioning -Recommended supervision/observation for mobility and out of bed intermittently at time of  discharge. -No home health services or placement needed at this moment.  overall history of bipolar disorder and dementia -No suicidal ideation or hallucination -Currently no agitation -Pleasantly confused and intermittently oriented x 2 -Continue Remeron, Exelon patch and Effexor.  hypertension -Continue metoprolol.      Disposition Plan:   Home 12/31 if stable  Family Communication:   Son updated on phone  Consultants:  none  Code Status:  FULL   DVT Prophylaxis:  Daniels Lovenox   Procedures: As Listed in Progress Note Above  Antibiotics: Ceftriaxone 12/28     Subjective: Patient denies fevers, chills, headache, chest pain, dyspnea, nausea, vomiting, diarrhea, abdominal pain   Objective: Vitals:   07/27/19 1400 07/27/19 2119 07/28/19 0524 07/28/19 1338  BP: (!) 128/51 (!) 152/69 (!) 171/79 137/65  Pulse: 72 72 97 75  Resp: 20 16 20 18   Temp: 98.3 F (36.8 C) 97.7 F (36.5 C) 98.6 F (37 C) 98.2 F (36.8 C)  TempSrc: Oral Oral Oral Oral  SpO2: 94% 95% 97% 95%  Weight:      Height:        Intake/Output Summary (Last 24 hours) at 07/28/2019 1754 Last data filed at 07/28/2019 1300 Gross per 24 hour  Intake 914.26 ml  Output --  Net 914.26 ml   Weight change:  Exam:   General:  Pt is alert, follows commands appropriately, not in acute distress  HEENT: No icterus, No thrush, No neck mass, [place]/AT  Cardiovascular: RRR, S1/S2, no rubs, no gallops  Respiratory: bibasilar rales  Abdomen: Soft/+BS, non tender, non distended, no guarding  Extremities: No edema, No lymphangitis, No petechiae, No rashes, no synovitis   Data Reviewed: I have personally reviewed following labs and imaging studies Basic Metabolic Panel: Recent Labs  Lab 07/23/19 1832 07/26/19 1158  07/27/19 0607 07/28/19 0624  NA 143 141 144 145  K 2.6* 2.3* 2.5* 3.9  CL 100 95* 101 109  CO2 32 34* 29 26  GLUCOSE 112* 116* 103* 119*  BUN 9 11 6* 7*  CREATININE 0.81 0.77 0.55  0.53  CALCIUM 9.1 9.0 8.3* 8.7*  MG  --   --  2.5* 2.3  PHOS  --   --  2.5 2.2*   Liver Function Tests: Recent Labs  Lab 07/26/19 1158 07/28/19 0624  AST 28 16  ALT 17 14  ALKPHOS 96 80  BILITOT 1.2 0.7  PROT 7.3 6.2*  ALBUMIN 3.5 2.9*   No results for input(s): LIPASE, AMYLASE in the last 168 hours. No results for input(s): AMMONIA in the last 168 hours. Coagulation Profile: No results for input(s): INR, PROTIME in the last 168 hours. CBC: Recent Labs  Lab 07/23/19 1832 07/26/19 1158 07/27/19 0607 07/28/19 0624  WBC 7.8 9.7 8.1 9.9  NEUTROABS  --   --   --  7.9*  HGB 12.9 13.2 11.8* 11.4*  HCT 39.6 41.5 37.9 36.2  MCV 83.7 85.6 86.7 85.8  PLT 253 293 256 266   Cardiac Enzymes: No results for input(s): CKTOTAL, CKMB, CKMBINDEX, TROPONINI in the last 168 hours. BNP: Invalid input(s): POCBNP CBG: No results for input(s): GLUCAP in the last 168 hours. HbA1C: No results for input(s): HGBA1C in the last 72 hours. Urine analysis:    Component Value Date/Time   COLORURINE YELLOW 07/26/2019 1522   APPEARANCEUR HAZY (A) 07/26/2019 1522   LABSPEC 1.004 (L) 07/26/2019 1522   PHURINE 6.0 07/26/2019 1522   GLUCOSEU NEGATIVE 07/26/2019 1522   HGBUR SMALL (A) 07/26/2019 1522   BILIRUBINUR NEGATIVE 07/26/2019 1522   KETONESUR NEGATIVE 07/26/2019 1522   PROTEINUR NEGATIVE 07/26/2019 1522   NITRITE POSITIVE (A) 07/26/2019 1522   LEUKOCYTESUR MODERATE (A) 07/26/2019 1522   Sepsis Labs: @LABRCNTIP (procalcitonin:4,lacticidven:4) ) Recent Results (from the past 240 hour(s))  Urine culture     Status: Abnormal   Collection Time: 07/26/19  3:22 PM   Specimen: Urine, Clean Catch  Result Value Ref Range Status   Specimen Description   Final    URINE, CLEAN CATCH Performed at [facility], [address] [place], [place] [address]    Special Requests   Final    NONE Performed at [facility], [address] [place], [place] [address]    Culture >=100,000 COLONIES/mL  ESCHERICHIA COLI (A)  Final   Report Status 07/28/2019 FINAL  Final   Organism ID, Bacteria ESCHERICHIA COLI (A)  Final      Susceptibility   Escherichia coli - MIC*    AMPICILLIN 8 SENSITIVE Sensitive     CEFAZOLIN <=4 SENSITIVE Sensitive     CEFTRIAXONE <=0.25 SENSITIVE Sensitive     CIPROFLOXACIN <=0.25 SENSITIVE Sensitive     GENTAMICIN <=1 SENSITIVE Sensitive     IMIPENEM <=0.25 SENSITIVE Sensitive     NITROFURANTOIN <=16 SENSITIVE Sensitive     TRIMETH/SULFA <=20 SENSITIVE Sensitive     AMPICILLIN/SULBACTAM 4 SENSITIVE Sensitive     PIP/TAZO <=4 SENSITIVE Sensitive     * >=100,000 COLONIES/mL ESCHERICHIA COLI  SARS CORONAVIRUS 2 (Lea Walbert 6-24 HRS) Nasopharyngeal Nasopharyngeal Swab     Status: Abnormal   Collection Time: 07/26/19  6:30 PM   Specimen: Nasopharyngeal Swab  Result Value Ref Range Status   SARS Coronavirus 2 POSITIVE (A) NEGATIVE Final    Comment: RESULT CALLED TO, READ BACK BY AND VERIFIED WITH: E.  Joya Gaskins RN 14:55 07/27/19 ([email or web address]) (NOTE) SARS-CoV-2 target nucleic acids are DETECTED. The SARS-CoV-2 RNA is generally detectable in upper and lower respiratory specimens during the acute phase of infection. Positive results are indicative of the presence of SARS-CoV-2 RNA. Clinical correlation with patient history and other diagnostic information is  necessary to determine patient infection status. Positive results do not rule out bacterial infection or co-infection with other viruses.  The expected result is Negative. Fact Sheet for Patients: SugarRoll.be Fact Sheet for Healthcare Providers: https://www.woods-mathews.com/ This test is not yet approved or cleared by the [place] FDA and  has been authorized for detection and/or diagnosis of SARS-CoV-2 by FDA under an Emergency Use Authorization (EUA). This EUA will remain  in effect (meaning this test can be used) for t he duration of the COVID-19 declaration under  Section 564(b)(1) of the Act, 21 U.S.C. section 360bbb-3(b)(1), unless the authorization is terminated or revoked sooner. Performed at [facility], [place] [address] [place], [place] [address]   Respiratory Panel by RT PCR (Flu A&B, Covid) - Nasopharyngeal Swab     Status: Abnormal   Collection Time: 07/26/19 10:10 PM   Specimen: Nasopharyngeal Swab  Result Value Ref Range Status   SARS Coronavirus 2 by RT PCR POSITIVE (A) NEGATIVE Final    Comment: RESULT CALLED TO, READ BACK BY AND VERIFIED WITH: NICHOLS,K. AT 2304 ON 07/26/2019 BY EVA (NOTE) SARS-CoV-2 target nucleic acids are DETECTED. SARS-CoV-2 RNA is generally detectable in upper respiratory specimens  during the acute phase of infection. Positive results are indicative of the presence of the identified virus, but do not rule out bacterial infection or co-infection with other pathogens not detected by the test. Clinical correlation with patient history and other diagnostic information is necessary to determine patient infection status. The expected result is Negative. Fact Sheet for Patients:  PinkCheek.be Fact Sheet for Healthcare Providers: GravelBags.it This test is not yet approved or cleared by the [place] FDA and  has been authorized for detection and/or diagnosis of SARS-CoV-2 by FDA under an Emergency Use Authorization (EUA).  This EUA will remain in effect (meaning this test can be use d) for the duration of  the COVID-19 declaration under Section 564(b)(1) of the Act, 21 U.S.C. section 360bbb-3(b)(1), unless the authorization is terminated or revoked sooner.    Influenza A by PCR NEGATIVE NEGATIVE Final   Influenza B by PCR NEGATIVE NEGATIVE Final    Comment: (NOTE) The Xpert Xpress SARS-CoV-2/FLU/RSV assay is intended as an aid in  the diagnosis of influenza from Nasopharyngeal swab specimens and  should not be used as a sole basis for  treatment. Nasal washings and  aspirates are unacceptable for Xpert Xpress SARS-CoV-2/FLU/RSV  testing. Fact Sheet for Patients: PinkCheek.be Fact Sheet for Healthcare Providers: GravelBags.it This test is not yet approved or cleared by the [place] FDA and  has been authorized for detection and/or diagnosis of SARS-CoV-2 by  FDA under an Emergency Use Authorization (EUA). This EUA will remain  in effect (meaning this test can be used) for the duration of the  Covid-19 declaration under Section 564(b)(1) of the Act, 21  U.S.C. section 360bbb-3(b)(1), unless the authorization is  terminated or revoked. Performed at [facility], [address], [place], [place] [address]      Scheduled Meds: . vitamin C  500 mg Oral Daily  . aspirin EC  81 mg Oral Daily  . dexamethasone  6 mg Oral Daily  . enoxaparin (LOVENOX) injection  40 mg Subcutaneous Q24H  . feeding supplement  1 Container Oral TID BM  . feeding supplement (PRO-STAT SUGAR FREE 64)  30 mL Oral TID  . memantine  10 mg Oral BID  . metoprolol tartrate  12.5 mg Oral Daily  . mirtazapine  30 mg Oral Daily  . multivitamin-lutein  1 capsule Oral Daily  . pantoprazole  40 mg Oral Q0600  . rivastigmine  13.3 mg Transdermal Daily  . saccharomyces boulardii  250 mg Oral BID  . venlafaxine XR  37.5 mg Oral Q breakfast  . zinc sulfate  220 mg Oral Daily   Continuous Infusions: . 0.9 % NaCl with KCl 40 mEq / L 75 mL/hr (07/28/19 1737)  . azithromycin 500 mg (07/28/19 1738)  . cefTRIAXone (ROCEPHIN)  IV 1 g (07/28/19 1632)    Procedures/Studies: CT Head Wo Contrast  Result Date: 07/26/2019 CLINICAL DATA:  History of dimension with mental status changes over the past few days, initial encounter EXAM: CT HEAD WITHOUT CONTRAST TECHNIQUE: Contiguous axial images were obtained from the base of the skull through the vertex without intravenous contrast. COMPARISON:  01/26/2015  FINDINGS: Brain: No evidence of acute infarction, hemorrhage, hydrocephalus, extra-axial collection or mass lesion/mass effect. Mild atrophic and chronic white matter ischemic changes are noted. Vascular: No hyperdense vessel or unexpected calcification. Skull: Normal. Negative for fracture or focal lesion. Sinuses/Orbits: No acute finding. Other: None. IMPRESSION: Chronic atrophic and ischemic changes without acute abnormality. Electronically Signed   By: Alcide Clever M.D.   On: 07/26/2019 15:34   DG Chest Port 1 View  Result Date: 07/26/2019 CLINICAL DATA:  Decreased appetite EXAM: PORTABLE CHEST 1 VIEW COMPARISON:  07/10/2016 FINDINGS: The heart size and mediastinal contours are within normal limits. Small slightly irregular airspace opacities are noted within the right upper lobe. Left lung appears clear. No pleural effusion or pneumothorax. The visualized skeletal structures are unremarkable. IMPRESSION: Small slightly irregular airspace opacities in the right upper lobe, this may represent developing pneumonia. Electronically Signed   By: Duanne Guess D.O.   On: 07/26/2019 14:57    Catarina Hartshorn, DO  [facility] Pager [phone]  If 7PM-7AM, please contact night-coverage [email or web address] Password [email or web address] 07/28/2019, 5:54 PM   LOS: 2 days

## 2019-07-28 NOTE — Progress Notes (Signed)
OT Cancellation Note  Patient Details Name: Tiffany Mack MRN: [id number] DOB: 1941/03/28   Cancelled Treatment:    Reason Eval/Treat Not Completed: OT screened, no needs identified, will sign off. Pt is functioning near baseline for ADL completion and mobility. Pt requiring assist with ADLs due to cognition, lives with family who provide assistance as needed. Pt is at supervision level with mobility. No further acute OT services required at this time.    [name], OTR/L  [phone] 07/28/2019, 7:30 AM

## 2019-07-28 NOTE — Plan of Care (Signed)
  Problem: Education: Goal: Knowledge of risk factors and measures for prevention of condition will improve Outcome: Progressing   Problem: Respiratory: Goal: Will maintain a patent airway Outcome: Progressing Goal: Complications related to the disease process, condition or treatment will be avoided or minimized Outcome: Progressing   Problem: Education: Goal: Knowledge of General Education information will improve Description: Including pain rating scale, medication(s)/side effects and non-pharmacologic comfort measures Outcome: Progressing   Problem: Health Behavior/Discharge Planning: Goal: Ability to manage health-related needs will improve Outcome: Progressing   Problem: Clinical Measurements: Goal: Ability to maintain clinical measurements within normal limits will improve Outcome: Progressing Goal: Will remain free from infection Outcome: Progressing Goal: Diagnostic test results will improve Outcome: Progressing Goal: Respiratory complications will improve Outcome: Progressing Goal: Cardiovascular complication will be avoided Outcome: Progressing   Problem: Activity: Goal: Risk for activity intolerance will decrease Outcome: Progressing   Problem: Nutrition: Goal: Adequate nutrition will be maintained Outcome: Progressing   Problem: Coping: Goal: Level of anxiety will decrease Outcome: Progressing   Problem: Elimination: Goal: Will not experience complications related to bowel motility Outcome: Progressing Goal: Will not experience complications related to urinary retention Outcome: Progressing   Problem: Pain Managment: Goal: General experience of comfort will improve Outcome: Progressing   Problem: Safety: Goal: Ability to remain free from injury will improve Outcome: Progressing   Problem: Skin Integrity: Goal: Risk for impaired skin integrity will decrease Outcome: Progressing   

## 2019-07-28 NOTE — Progress Notes (Signed)
Initial Nutrition Assessment  DOCUMENTATION CODES:   Not applicable  INTERVENTION:  -Continue Boost Breeze po TID, each supplement provides 250 kcal and 9 grams of protein  Prostat 30 ml po TID, each supplement provides 100 kcal and 15 grams of protein  Ocuvite daily, MVI provides 200 mg Vit C, 40 mg Zinc, 55 mcg Selenium, 2 mg Copper, 2 mg Lutein to support wound healing  Consider feeding assistance to encourage po intake given history of dementia and acute metabolic encephalopathy  Advance diet as medically feasible  Monitor magnesium, potassium, and phosphorus daily for at least 3 days, MD to replete as needed, as pt is at risk for refeeding syndrome given ongoing poor oral intake  NUTRITION DIAGNOSIS:   Inadequate oral intake related to acute illness, lethargy/confusion(acute metabolic encephalopathy in the setting of COVID-19 infection) as evidenced by (patient consuming 0-10% of meals on CL diet, per chart, son reports 4-5 day history of diarrhea prior to admission; stage 3 pressure injury to sacrum).   GOAL:   Patient will meet greater than or equal to 90% of their needs   MONITOR:   PO intake, Supplement acceptance, I & O's, Diet advancement, Labs, Weight trends, Skin  REASON FOR ASSESSMENT:   Malnutrition Screening Tool    ASSESSMENT:  RD working remotely.  78 year old female with past medical history of anxiety and depression, bipolar disorderdementia, CAD, HTN, HLD, GERD who presented with son to ED with reports of worsening confusion and diarrhea for the past 4-5 days as well as development of sacral ulcer secondary to increased amount of time in bed. Patient admitted with acute metabolic encephalopathy in the setting of COVID-19 infection and acute lower UTI  Per chart, pt pleasantly confused and intermittently oriented x 1-2.   Patient with increased needs secondary to COVID-19 infection as well as stage III pressure injury. Per chart, son of patient  reporting ongoing diarrhea 4-5 days prior to admission, suspect she was unable to maintain adequate nutrition during that time. Patient documented with very poor po intake during admission, consuming 0-10% x 2 meals on clear liquid diet. Patient is at very high risk for malnutrition and would recommend consideration of nutrition support if unable to advance diet in the next 24 hrs.   Patient is provided [employer] nutrition supplement three times daily. RD will continue to monitor for po intake as well as diet advancement, and provide Prostat three times daily to aid with calorie/protein needs.   Current wt 68 kg (149.6 lb) Medications reviewed and include: Vitamin C, Decadron, Namenda, Remeron, Protonix, Zinc sulfate NaCl with KCl 40 mEq Zithromax Rocephin Labs: reviewed  NUTRITION - FOCUSED PHYSICAL EXAM: Unable to complete at this time, RD working remotely secondary to patient COVID-19 infection  Diet Order:   Diet Order            Diet clear liquid Room service appropriate? Yes; Fluid consistency: Thin  Diet effective now              EDUCATION NEEDS:   No education needs have been identified at this time  Skin:  Skin Assessment: Reviewed RN Assessment(stageIII; coccyx)  Last BM:  12/28 (type 7)  Height:   Ht Readings from Last 1 Encounters:  07/26/19 5\' 6"  (1.676 m)    Weight:   Wt Readings from Last 1 Encounters:  07/26/19 68 kg    Ideal Body Weight:  59.1 kg  BMI:  Body mass index is 24.21 kg/m.  Estimated Nutritional Needs:  Kcal:  4128-7867 (MSJ x 1.5-1.8)  Protein:  108-116 (1.6-1.7 g/kg)  Fluid:  >/= 1.7 L/day   Lars Masson, RD, LDN Clinical Nutrition Office Telephone [phone] After Hours/Weekend Pager: [phone]

## 2019-07-29 DIAGNOSIS — F0390 Unspecified dementia without behavioral disturbance: Secondary | ICD-10-CM

## 2019-07-29 LAB — CBC WITH DIFFERENTIAL/PLATELET
Abs Immature Granulocytes: 0.06 10*3/uL (ref 0.00–0.07)
Basophils Absolute: 0.1 10*3/uL (ref 0.0–0.1)
Basophils Relative: 1 %
Eosinophils Absolute: 0 10*3/uL (ref 0.0–0.5)
Eosinophils Relative: 0 %
HCT: 36.6 % (ref 36.0–46.0)
Hemoglobin: 11.4 g/dL — ABNORMAL LOW (ref 12.0–15.0)
Immature Granulocytes: 1 %
Lymphocytes Relative: 30 %
Lymphs Abs: 3 10*3/uL (ref 0.7–4.0)
MCH: 27.2 pg (ref 26.0–34.0)
MCHC: 31.1 g/dL (ref 30.0–36.0)
MCV: 87.4 fL (ref 80.0–100.0)
Monocytes Absolute: 0.5 10*3/uL (ref 0.1–1.0)
Monocytes Relative: 5 %
Neutro Abs: 6.4 10*3/uL (ref 1.7–7.7)
Neutrophils Relative %: 63 %
Platelets: 249 10*3/uL (ref 150–400)
RBC: 4.19 MIL/uL (ref 3.87–5.11)
RDW: 12.9 % (ref 11.5–15.5)
WBC: 10 10*3/uL (ref 4.0–10.5)
nRBC: 0 % (ref 0.0–0.2)

## 2019-07-29 LAB — C-REACTIVE PROTEIN: CRP: 0.7 mg/dL (ref <1.0)

## 2019-07-29 LAB — COMPREHENSIVE METABOLIC PANEL
ALT: 16 U/L (ref 0–44)
AST: 20 U/L (ref 15–41)
Albumin: 2.8 g/dL — ABNORMAL LOW (ref 3.5–5.0)
Alkaline Phosphatase: 82 U/L (ref 38–126)
Anion gap: 12 (ref 5–15)
BUN: 10 mg/dL (ref 8–23)
CO2: 27 mmol/L (ref 22–32)
Calcium: 8.6 mg/dL — ABNORMAL LOW (ref 8.9–10.3)
Chloride: 107 mmol/L (ref 98–111)
Creatinine, Ser: 0.56 mg/dL (ref 0.44–1.00)
GFR calc Af Amer: >60 mL/min (ref >60)
GFR calc non Af Amer: >60 mL/min (ref >60)
Glucose, Bld: 81 mg/dL (ref 70–99)
Potassium: 3.5 mmol/L (ref 3.5–5.1)
Sodium: 146 mmol/L — ABNORMAL HIGH (ref 135–145)
Total Bilirubin: 0.4 mg/dL (ref 0.3–1.2)
Total Protein: 6 g/dL — ABNORMAL LOW (ref 6.5–8.1)

## 2019-07-29 LAB — PHOSPHORUS: Phosphorus: 2.4 mg/dL — ABNORMAL LOW (ref 2.5–4.6)

## 2019-07-29 LAB — FERRITIN: Ferritin: 103 ng/mL (ref 11–307)

## 2019-07-29 LAB — MAGNESIUM: Magnesium: 2.1 mg/dL (ref 1.7–2.4)

## 2019-07-29 MED ORDER — K PHOS MONO-SOD PHOS DI & MONO 155-852-130 MG PO TABS
250.0000 mg | ORAL_TABLET | Freq: Three times a day (TID) | ORAL | Status: DC
  Administered 2019-07-29 (×2): 250 mg via ORAL
  Filled 2019-07-29 (×2): qty 1

## 2019-07-29 MED ORDER — CEFDINIR 300 MG PO CAPS: 300.0000 mg | ORAL_CAPSULE | Freq: Two times a day (BID) | ORAL | 0 refills | Status: DC

## 2019-07-29 MED ORDER — DEXAMETHASONE 4 MG PO TABS
6.0000 mg | ORAL_TABLET | Freq: Every day | ORAL | Status: DC
  Administered 2019-07-29: 6 mg via ORAL
  Filled 2019-07-29: qty 2

## 2019-07-29 MED ORDER — DEXAMETHASONE 6 MG PO TABS: 6.0000 mg | ORAL_TABLET | Freq: Every day | ORAL | 0 refills | Status: DC

## 2019-07-29 MED ORDER — CEFDINIR 300 MG PO CAPS
300.0000 mg | ORAL_CAPSULE | Freq: Two times a day (BID) | ORAL | Status: DC
  Administered 2019-07-29: 300 mg via ORAL
  Filled 2019-07-29: qty 1

## 2019-07-29 MED ORDER — K PHOS MONO-SOD PHOS DI & MONO 155-852-130 MG PO TABS: 250.0000 mg | ORAL_TABLET | Freq: Three times a day (TID) | ORAL | 0 refills | Status: DC

## 2019-07-29 NOTE — Care Management Important Message (Signed)
Important Message  Patient Details  Name: Tiffany Mack MRN: [id number] Date of Birth: Mar 24, 1941   Medicare Important Message Given:  Yes(RN will deliver to patient due to contact precautions)     Tommy Medal 07/29/2019, 12:22 PM

## 2019-07-29 NOTE — TOC Initial Note (Signed)
Late entry for 07/28/2019  Transition of Care [facility]) - Initial/Assessment Note    Patient Details  Name: Tiffany Mack MRN: [id number] Date of Birth: 08/22/1940  Transition of Care [facility]) CM/SW Contact:    Annice Needy, LCSW Phone Number: 07/29/2019, 3:05 PM  Clinical Narrative:                 Son, Mr. Laural Benes, provided history. Patient from home with son, daughter in law, and grandson. Admitted for Acute metabolic encephalopathy, COVID-19 pneumonia.  At baseline patient is independent with ADLS. She goes to her home from time to time and stays alone.  PT evaluation and recommendations were discussed.  LTC options such as [facility] and ALF once patient is over COVID infection were discussed as private pay options.  Mr. Laural Benes indicated that his wife and her son were previously positive for COVID and that upon discharge he would allow patient to return to her home and would check on her frequently then bring her back to his home.     Expected Discharge Plan: Home/Self Care Barriers to Discharge: No Barriers Identified   Patient Goals and CMS Choice        Expected Discharge Plan and Services Expected Discharge Plan: Home/Self Care         Expected Discharge Date: 07/29/19                                    Prior Living Arrangements/Services   Lives with:: Relatives, Adult Children Patient language and need for interpreter reviewed:: Yes        Need for Family Participation in Patient Care: Yes (Comment) Care giver support system in place?: Yes (comment)   Criminal Activity/Legal Involvement Pertinent to Current Situation/Hospitalization: No - Comment as needed  Activities of Daily Living Home Assistive Devices/Equipment: None ADL Screening (condition at time of admission) Patient's cognitive ability adequate to safely complete daily activities?: Yes Is the patient deaf or have difficulty hearing?: No Does the patient have difficulty seeing, even  when wearing glasses/contacts?: No Does the patient have difficulty concentrating, remembering, or making decisions?: Yes Patient able to express need for assistance with ADLs?: Yes Does the patient have difficulty dressing or bathing?: Yes Independently performs ADLs?: Yes (appropriate for developmental age) Does the patient have difficulty walking or climbing stairs?: Yes Weakness of Legs: None Weakness of Arms/Hands: None  Permission Sought/Granted Permission sought to share information with : Family Supports          Permission granted to share info w Relationship: Son, Mr. Laural Benes     Emotional Assessment Appearance:: Appears stated age   Affect (typically observed): Unable to Assess Orientation: : Oriented to Place, Oriented to Self Alcohol / Substance Use: Not Applicable Psych Involvement: No (comment)  Admission diagnosis:  Hypokalemia [E87.6] Acute cystitis without hematuria [N30.00] Acute encephalopathy [G93.40] Community acquired pneumonia of right upper lobe of lung [J18.9] Pressure injury of sacral region, stage 3 (HCC) [L89.153] Patient Active Problem List   Diagnosis Date Noted  . Pneumonia due to COVID-19 virus 07/28/2019  . Acute cystitis without hematuria   . Acute encephalopathy 07/26/2019  . Pressure injury of skin 07/26/2019  . CAP (community acquired pneumonia) 07/26/2019  . Hypokalemia 07/26/2019  . Acute lower UTI 07/26/2019  . Depression 07/26/2019  . Malnutrition of moderate degree 03/24/2018  . 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 Vomiting 03/21/2018  . HTN (hypertension) 03/21/2018  .  Dementia ([place]) 03/21/2018  . CAD (coronary artery disease) 03/21/2018  . Acute non Q wave MI (myocardial infarction), initial episode of care ([place]) 08/04/2015   PCP:  Jonathon Jordan, MD Pharmacy:   [facility], [place] [address] Phone: [phone] Fax: [phone]  [id number] -  [place], [place] [address] Phone: [phone] Fax: [phone]     Social Determinants of Health (SDOH) Interventions    Readmission Risk Interventions No flowsheet data found.

## 2019-07-29 NOTE — Discharge Summary (Signed)
Physician Discharge Summary  Natlie Asfour HEN:[id number] DOB: [birth year] DOA: 07/26/2019  PCP: Mila Palmer, MD  Admit date: 07/26/2019 Discharge date: 07/29/2019  Admitted From: Home Disposition:  Home   Recommendations for Outpatient Follow-up:  1. Follow up with PCP in 1-2 weeks 2. Please obtain BMP/CBC in one week     Discharge Condition: Stable CODE STATUS: FULL Diet recommendation: Heart Healthy    Brief/Interim Summary: 78 y.o.femalewith medical history significant ofAnxiety, Dementia, and CAD, HTN, GERD, depression, HLD. Pt presents w/ son who provides history due to pts confusion. Pt simply states she doesn't feel well and has diarrhea. No other acute complaints. Son states that she has had diarrhea for the past 4-5 days with marked worsening over the last 2 days. Over this period of time she has bewcome more and more confused. States she has been seeing a dead familyu member off and on for the past 10 days. Pt has had difficultyw/ urination over the last couple of days. Pt has spent an increased amount of time and has developed bed sores per pts son and caregiver.  Patient was started on IV abx and IVF with clinical improvement of her mental status.  She remained stable on RA throughout the hospitalization.  Discharge Diagnoses:  acute metabolic encephalopathy -In the setting of UTI and COVID-19 infection -Continue IV fluids -started ceftriaxone pending culture results and sensitivity -d/c home with cefdinir x 4 more days -d/c steroids as pt has not had dyspnea or hypoxia  COVID-19 pneumonia -pt has not had any hypoxia or dyspnea -dexamethasone was started due to elevated inflammatory markers -No meeting criteria for remdesivir -remains stable on RA -d/c home with 5 more days dexamethasone  acute lower UTI -UA demonstrated infection -Continued ceftriaxone IV during hospitalization -culture with pansensitive Ecoli -Continue IV fluids. -d/c home  with cefdinir x 4 more days  hypokalemia/dehydration -Continue IV fluids -Replete electrolytes follow trend -Magnesium within normal limits.  sacral decubitus ulcer -Present on admission -Stage III -No signs of superimposed infection -Continue preventive measures and wound care recommendations.  physical deconditioning -Appreciate physical therapy evaluation--no PT followup recommended -Patient near baseline functioning -Recommended supervision/observation for mobility and out of bed intermittently at time of discharge. -No home health services or placement needed at this moment.  bipolar disorder and dementia -No suicidal ideation or hallucination -Currently no agitation -Pleasantly confused and  oriented x 2--likely the patient's baseline -Continue Remeron, Exelon patch and Effexor.  hypertension -Continue metoprolol.    Discharge Instructions  Discharge Instructions    MyChart COVID-19 home monitoring program   Complete by: Jul 29, 2019    Is the patient willing to use the MyChart Mobile App for home monitoring?: Yes   Temperature monitoring   Complete by: Jul 29, 2019    After how many days would you like to receive a notification of this patient's flowsheet entries?: 1     Allergies as of 07/29/2019   No Known Allergies     Medication List    STOP taking these medications   loperamide 2 MG capsule Commonly known as: IMODIUM   polyethylene glycol 17 g packet Commonly known as: MIRALAX / GLYCOLAX     TAKE these medications   aspirin 81 MG EC tablet Take 1 tablet (81 mg total) by mouth daily. What changed:   how much to take  when to take this   atorvastatin 80 MG tablet Commonly known as: LIPITOR Take 1 tablet (80 mg total) by mouth daily at 6 PM.  cefdinir 300 MG capsule Commonly known as: OMNICEF Take 1 capsule (300 mg total) by mouth every 12 (twelve) hours.   dexamethasone 6 MG tablet Commonly known as: DECADRON Take 1 tablet (6  mg total) by mouth daily.   memantine 10 MG tablet Commonly known as: NAMENDA Take 1 tablet every night for 2 weeks, then increase to 1 tablet twice a day What changed:   how much to take  how to take this  when to take this  additional instructions   metoprolol tartrate 25 MG tablet Commonly known as: LOPRESSOR Take 0.5 tablets (12.5 mg total) by mouth 2 (two) times daily. What changed: when to take this   mirtazapine 30 MG tablet Commonly known as: REMERON Take 30 mg by mouth daily.   nitroGLYCERIN 0.4 MG SL tablet Commonly known as: NITROSTAT Place 1 tablet (0.4 mg total) under the tongue every 5 (five) minutes as needed for chest pain (CP or SOB).   ondansetron 8 MG tablet Commonly known as: ZOFRAN Take 8 mg by mouth every 8 (eight) hours as needed for nausea/vomiting.   pantoprazole 40 MG tablet Commonly known as: PROTONIX Take 1 tablet (40 mg total) by mouth daily at 6 (six) AM.   phosphorus 155-852-130 MG tablet Commonly known as: K PHOS NEUTRAL Take 1 tablet (250 mg total) by mouth 4 (four) times daily -  before meals and at bedtime.   rivastigmine 9.5 mg/24hr Commonly known as: EXELON Place 13.3 mg onto the skin daily.   rivastigmine 13.3 MG/24HR Commonly known as: EXELON Place 13.3 mg onto the skin daily.   venlafaxine XR 37.5 MG 24 hr capsule Commonly known as: EFFEXOR-XR Take 37.5 mg by mouth daily with breakfast.       No Known Allergies  Consultations:  none   Procedures/Studies: CT Head Wo Contrast  Result Date: 07/26/2019 CLINICAL DATA:  History of dimension with mental status changes over the past few days, initial encounter EXAM: CT HEAD WITHOUT CONTRAST TECHNIQUE: Contiguous axial images were obtained from the base of the skull through the vertex without intravenous contrast. COMPARISON:  01/26/2015 FINDINGS: Brain: No evidence of acute infarction, hemorrhage, hydrocephalus, extra-axial collection or mass lesion/mass effect. Mild  atrophic and chronic white matter ischemic changes are noted. Vascular: No hyperdense vessel or unexpected calcification. Skull: Normal. Negative for fracture or focal lesion. Sinuses/Orbits: No acute finding. Other: None. IMPRESSION: Chronic atrophic and ischemic changes without acute abnormality. Electronically Signed   By: Alcide CleverMark  Lukens M.D.   On: 07/26/2019 15:34   DG Chest Port 1 View  Result Date: 07/26/2019 CLINICAL DATA:  Decreased appetite EXAM: PORTABLE CHEST 1 VIEW COMPARISON:  07/10/2016 FINDINGS: The heart size and mediastinal contours are within normal limits. Small slightly irregular airspace opacities are noted within the right upper lobe. Left lung appears clear. No pleural effusion or pneumothorax. The visualized skeletal structures are unremarkable. IMPRESSION: Small slightly irregular airspace opacities in the right upper lobe, this may represent developing pneumonia. Electronically Signed   By: Duanne GuessNicholas  Plundo D.O.   On: 07/26/2019 14:57         Discharge Exam: Vitals:   07/28/19 2137 07/29/19 0634  BP: (!) 115/36 (!) 153/78  Pulse: 84 83  Resp: 20 16  Temp: 98.1 F (36.7 C) (!) 97.5 F (36.4 C)  SpO2: 96% 97%   Vitals:   07/28/19 0524 07/28/19 1338 07/28/19 2137 07/29/19 0634  BP: (!) 171/79 137/65 (!) 115/36 (!) 153/78  Pulse: 97 75 84 83  Resp: 20  Temp: 98.6 F (37 C) 98.2 F (36.8 C) 98.1 F (36.7 C) (!) 97.5 F (36.4 C)  TempSrc: Oral Oral Oral Oral  SpO2: 97% 95% 96% 97%  Weight:      Height:        General: Pt is alert, awake, not in acute distress Cardiovascular: RRR, S1/S2 +, no rubs, no gallops Respiratory: CTA bilaterally, no wheezing, no rhonchi Abdominal: Soft, NT, ND, bowel sounds + Extremities: no edema, no cyanosis   The results of significant diagnostics from this hospitalization (including imaging, microbiology, ancillary and laboratory) are listed below for reference.    Significant Diagnostic Studies: CT Head Wo  Contrast  Result Date: 07/26/2019 CLINICAL DATA:  History of dimension with mental status changes over the past few days, initial encounter EXAM: CT HEAD WITHOUT CONTRAST TECHNIQUE: Contiguous axial images were obtained from the base of the skull through the vertex without intravenous contrast. COMPARISON:  01/26/2015 FINDINGS: Brain: No evidence of acute infarction, hemorrhage, hydrocephalus, extra-axial collection or mass lesion/mass effect. Mild atrophic and chronic white matter ischemic changes are noted. Vascular: No hyperdense vessel or unexpected calcification. Skull: Normal. Negative for fracture or focal lesion. Sinuses/Orbits: No acute finding. Other: None. IMPRESSION: Chronic atrophic and ischemic changes without acute abnormality. Electronically Signed   By: Alcide Clever M.D.   On: 07/26/2019 15:34   DG Chest Port 1 View  Result Date: 07/26/2019 CLINICAL DATA:  Decreased appetite EXAM: PORTABLE CHEST 1 VIEW COMPARISON:  07/10/2016 FINDINGS: The heart size and mediastinal contours are within normal limits. Small slightly irregular airspace opacities are noted within the right upper lobe. Left lung appears clear. No pleural effusion or pneumothorax. The visualized skeletal structures are unremarkable. IMPRESSION: Small slightly irregular airspace opacities in the right upper lobe, this may represent developing pneumonia. Electronically Signed   By: Duanne Guess D.O.   On: 07/26/2019 14:57     Microbiology: Recent Results (from the past 240 hour(s))  Urine culture     Status: Abnormal   Collection Time: 07/26/19  3:22 PM   Specimen: Urine, Clean Catch  Result Value Ref Range Status   Specimen Description   Final    URINE, CLEAN CATCH Performed at [facility], [address], [place], [place] [address]    Special Requests   Final    NONE Performed at [facility], [address] [place], [place] [address]    Culture >=100,000 COLONIES/mL ESCHERICHIA COLI (A)  Final   Report  Status 07/28/2019 FINAL  Final   Organism ID, Bacteria ESCHERICHIA COLI (A)  Final      Susceptibility   Escherichia coli - MIC*    AMPICILLIN 8 SENSITIVE Sensitive     CEFAZOLIN <=4 SENSITIVE Sensitive     CEFTRIAXONE <=0.25 SENSITIVE Sensitive     CIPROFLOXACIN <=0.25 SENSITIVE Sensitive     GENTAMICIN <=1 SENSITIVE Sensitive     IMIPENEM <=0.25 SENSITIVE Sensitive     NITROFURANTOIN <=16 SENSITIVE Sensitive     TRIMETH/SULFA <=20 SENSITIVE Sensitive     AMPICILLIN/SULBACTAM 4 SENSITIVE Sensitive     PIP/TAZO <=4 SENSITIVE Sensitive     * >=100,000 COLONIES/mL ESCHERICHIA COLI  SARS CORONAVIRUS 2 (Kelcy Baeten 6-24 HRS) Nasopharyngeal Nasopharyngeal Swab     Status: Abnormal   Collection Time: 07/26/19  6:30 PM   Specimen: Nasopharyngeal Swab  Result Value Ref Range Status   SARS Coronavirus 2 POSITIVE (A) NEGATIVE Final    Comment: RESULT CALLED TO, READ BACK BY AND VERIFIED WITH:  Georgiana Shore RN 14:55 07/27/19 ([email or web address]) (NOTE) SARS-CoV-2 target nucleic acids are DETECTED. The SARS-CoV-2 RNA is generally detectable in upper and lower respiratory specimens during the acute phase of infection. Positive results are indicative of the presence of SARS-CoV-2 RNA. Clinical correlation with patient history and other diagnostic information is  necessary to determine patient infection status. Positive results do not rule out bacterial infection or co-infection with other viruses.  The expected result is Negative. Fact Sheet for Patients: SugarRoll.be Fact Sheet for Healthcare Providers: https://www.woods-mathews.com/ This test is not yet approved or cleared by the [place] FDA and  has been authorized for detection and/or diagnosis of SARS-CoV-2 by FDA under an Emergency Use Authorization (EUA). This EUA will remain  in effect (meaning this test can be used) for t he duration of the COVID-19 declaration under Section 564(b)(1) of the Act, 21  U.S.C. section 360bbb-3(b)(1), unless the authorization is terminated or revoked sooner. Performed at [facility], [place] [address] [place], [place] [address]   Respiratory Panel by RT PCR (Flu A&B, Covid) - Nasopharyngeal Swab     Status: Abnormal   Collection Time: 07/26/19 10:10 PM   Specimen: Nasopharyngeal Swab  Result Value Ref Range Status   SARS Coronavirus 2 by RT PCR POSITIVE (A) NEGATIVE Final    Comment: RESULT CALLED TO, READ BACK BY AND VERIFIED WITH: NICHOLS,K. AT 2304 ON 07/26/2019 BY EVA (NOTE) SARS-CoV-2 target nucleic acids are DETECTED. SARS-CoV-2 RNA is generally detectable in upper respiratory specimens  during the acute phase of infection. Positive results are indicative of the presence of the identified virus, but do not rule out bacterial infection or co-infection with other pathogens not detected by the test. Clinical correlation with patient history and other diagnostic information is necessary to determine patient infection status. The expected result is Negative. Fact Sheet for Patients:  PinkCheek.be Fact Sheet for Healthcare Providers: GravelBags.it This test is not yet approved or cleared by the [place] FDA and  has been authorized for detection and/or diagnosis of SARS-CoV-2 by FDA under an Emergency Use Authorization (EUA).  This EUA will remain in effect (meaning this test can be use d) for the duration of  the COVID-19 declaration under Section 564(b)(1) of the Act, 21 U.S.C. section 360bbb-3(b)(1), unless the authorization is terminated or revoked sooner.    Influenza A by PCR NEGATIVE NEGATIVE Final   Influenza B by PCR NEGATIVE NEGATIVE Final    Comment: (NOTE) The Xpert Xpress SARS-CoV-2/FLU/RSV assay is intended as an aid in  the diagnosis of influenza from Nasopharyngeal swab specimens and  should not be used as a sole basis for treatment. Nasal washings and   aspirates are unacceptable for Xpert Xpress SARS-CoV-2/FLU/RSV  testing. Fact Sheet for Patients: PinkCheek.be Fact Sheet for Healthcare Providers: GravelBags.it This test is not yet approved or cleared by the [place] FDA and  has been authorized for detection and/or diagnosis of SARS-CoV-2 by  FDA under an Emergency Use Authorization (EUA). This EUA will remain  in effect (meaning this test can be used) for the duration of the  Covid-19 declaration under Section 564(b)(1) of the Act, 21  U.S.C. section 360bbb-3(b)(1), unless the authorization is  terminated or revoked. Performed at [facility], [address] [place], [place] [address]      Labs: Basic Metabolic Panel: Recent Labs  Lab 07/23/19 1832 07/26/19 1158 07/27/19 0607 07/28/19 0624 07/29/19 0803  NA 143 141 144 145 146*  K 2.6* 2.3* 2.5* 3.9 3.5  CL 100 95* 101 109 107  CO2 32 34* GLUCOSE 112* 116* 103* 119* 81  BUN 9 11 6* 7* 10  CREATININE 0.81 0.77 0.55 0.53 0.56  CALCIUM 9.1 9.0 8.3* 8.7* 8.6*  MG  --   --  2.5* 2.3 2.1  PHOS  --   --  2.5 2.2* 2.4*   Liver Function Tests: Recent Labs  Lab 07/26/19 1158 07/28/19 0624 07/29/19 0803  AST ALT ALKPHOS 96 80 82  BILITOT 1.2 0.7 0.4  PROT 7.3 6.2* 6.0*  ALBUMIN 3.5 2.9* 2.8*   No results for input(s): LIPASE, AMYLASE in the last 168 hours. No results for input(s): AMMONIA in the last 168 hours. CBC: Recent Labs  Lab 07/23/19 1832 07/26/19 1158 07/27/19 0607 07/28/19 0624 07/29/19 0803  WBC 7.8 9.7 8.1 9.9 10.0  NEUTROABS  --   --   --  7.9* 6.4  HGB 12.9 13.2 11.8* 11.4* 11.4*  HCT 39.6 41.5 37.9 36.2 36.6  MCV 83.7 85.6 86.7 85.8 87.4  PLT 253 293 256 266 249   Cardiac Enzymes: No results for input(s): CKTOTAL, CKMB, CKMBINDEX, TROPONINI in the last 168 hours. BNP: Invalid input(s): POCBNP CBG: No results for input(s): GLUCAP in the last 168  hours.  Time coordinating discharge:  36 minutes  Signed:  Catarina Hartshorn, DO [facility] Pager: [phone] 07/29/2019, 2:18 PM

## 2019-07-29 NOTE — Progress Notes (Signed)
IV removed and discharge instructions reviewed.  Scripts sent to pharmacy.  Family to give ride home

## 2019-07-29 NOTE — Progress Notes (Signed)
Sacral foam changed over wound to sacrum.

## 2019-08-19 ENCOUNTER — Other Ambulatory Visit: Payer: Self-pay | Admitting: Gastroenterology

## 2019-08-19 DIAGNOSIS — Z1211 Encounter for screening for malignant neoplasm of colon: Secondary | ICD-10-CM

## 2019-08-23 ENCOUNTER — Emergency Department (HOSPITAL_COMMUNITY): Payer: Medicare HMO

## 2019-08-23 ENCOUNTER — Inpatient Hospital Stay (HOSPITAL_COMMUNITY)
Admission: EM | Admit: 2019-08-23 | Discharge: 2019-08-27 | DRG: 987 | Disposition: A | Discharge status: Skilled Nursing Facility | Payer: Medicare HMO | Attending: Internal Medicine | Admitting: Internal Medicine

## 2019-08-23 ENCOUNTER — Other Ambulatory Visit: Payer: Self-pay

## 2019-08-23 ENCOUNTER — Encounter (HOSPITAL_COMMUNITY): Payer: Self-pay | Admitting: Emergency Medicine

## 2019-08-23 DIAGNOSIS — L89153 Pressure ulcer of sacral region, stage 3: Secondary | ICD-10-CM | POA: Diagnosis not present

## 2019-08-23 DIAGNOSIS — E785 Hyperlipidemia, unspecified: Secondary | ICD-10-CM | POA: Diagnosis present

## 2019-08-23 DIAGNOSIS — S61215A Laceration without foreign body of left ring finger without damage to nail, initial encounter: Secondary | ICD-10-CM | POA: Diagnosis not present

## 2019-08-23 DIAGNOSIS — I469 Cardiac arrest, cause unspecified: Secondary | ICD-10-CM | POA: Diagnosis not present

## 2019-08-23 DIAGNOSIS — S01512A Laceration without foreign body of oral cavity, initial encounter: Secondary | ICD-10-CM | POA: Diagnosis present

## 2019-08-23 DIAGNOSIS — F0390 Unspecified dementia without behavioral disturbance: Secondary | ICD-10-CM | POA: Diagnosis present

## 2019-08-23 DIAGNOSIS — Y92019 Unspecified place in single-family (private) house as the place of occurrence of the external cause: Secondary | ICD-10-CM

## 2019-08-23 DIAGNOSIS — Y92009 Unspecified place in unspecified non-institutional (private) residence as the place of occurrence of the external cause: Secondary | ICD-10-CM | POA: Diagnosis not present

## 2019-08-23 DIAGNOSIS — S01521S Laceration with foreign body of lip, sequela: Secondary | ICD-10-CM | POA: Diagnosis not present

## 2019-08-23 DIAGNOSIS — S6992XA Unspecified injury of left wrist, hand and finger(s), initial encounter: Secondary | ICD-10-CM | POA: Diagnosis not present

## 2019-08-23 DIAGNOSIS — S0590XA Unspecified injury of unspecified eye and orbit, initial encounter: Secondary | ICD-10-CM | POA: Diagnosis not present

## 2019-08-23 DIAGNOSIS — R509 Fever, unspecified: Secondary | ICD-10-CM

## 2019-08-23 DIAGNOSIS — S01511A Laceration without foreign body of lip, initial encounter: Secondary | ICD-10-CM | POA: Diagnosis not present

## 2019-08-23 DIAGNOSIS — R410 Disorientation, unspecified: Secondary | ICD-10-CM | POA: Diagnosis not present

## 2019-08-23 DIAGNOSIS — R440 Auditory hallucinations: Secondary | ICD-10-CM | POA: Diagnosis not present

## 2019-08-23 DIAGNOSIS — I2510 Atherosclerotic heart disease of native coronary artery without angina pectoris: Secondary | ICD-10-CM | POA: Diagnosis present

## 2019-08-23 DIAGNOSIS — L8990 Pressure ulcer of unspecified site, unspecified stage: Secondary | ICD-10-CM | POA: Diagnosis present

## 2019-08-23 DIAGNOSIS — Z79899 Other long term (current) drug therapy: Secondary | ICD-10-CM | POA: Diagnosis not present

## 2019-08-23 DIAGNOSIS — I959 Hypotension, unspecified: Secondary | ICD-10-CM | POA: Diagnosis not present

## 2019-08-23 DIAGNOSIS — S299XXA Unspecified injury of thorax, initial encounter: Secondary | ICD-10-CM | POA: Diagnosis not present

## 2019-08-23 DIAGNOSIS — R52 Pain, unspecified: Secondary | ICD-10-CM | POA: Diagnosis not present

## 2019-08-23 DIAGNOSIS — K219 Gastro-esophageal reflux disease without esophagitis: Secondary | ICD-10-CM | POA: Diagnosis not present

## 2019-08-23 DIAGNOSIS — Z23 Encounter for immunization: Secondary | ICD-10-CM | POA: Diagnosis not present

## 2019-08-23 DIAGNOSIS — Z8616 Personal history of COVID-19: Secondary | ICD-10-CM

## 2019-08-23 DIAGNOSIS — R404 Transient alteration of awareness: Secondary | ICD-10-CM | POA: Diagnosis not present

## 2019-08-23 DIAGNOSIS — R443 Hallucinations, unspecified: Secondary | ICD-10-CM | POA: Diagnosis not present

## 2019-08-23 DIAGNOSIS — R296 Repeated falls: Secondary | ICD-10-CM | POA: Diagnosis present

## 2019-08-23 DIAGNOSIS — I10 Essential (primary) hypertension: Secondary | ICD-10-CM | POA: Diagnosis not present

## 2019-08-23 DIAGNOSIS — W19XXXS Unspecified fall, sequela: Secondary | ICD-10-CM | POA: Diagnosis not present

## 2019-08-23 DIAGNOSIS — I252 Old myocardial infarction: Secondary | ICD-10-CM

## 2019-08-23 DIAGNOSIS — W109XXA Fall (on) (from) unspecified stairs and steps, initial encounter: Secondary | ICD-10-CM | POA: Diagnosis present

## 2019-08-23 DIAGNOSIS — L89151 Pressure ulcer of sacral region, stage 1: Secondary | ICD-10-CM

## 2019-08-23 DIAGNOSIS — U071 COVID-19: Secondary | ICD-10-CM | POA: Diagnosis not present

## 2019-08-23 DIAGNOSIS — W19XXXA Unspecified fall, initial encounter: Secondary | ICD-10-CM

## 2019-08-23 DIAGNOSIS — F0281 Dementia in other diseases classified elsewhere with behavioral disturbance: Secondary | ICD-10-CM | POA: Diagnosis not present

## 2019-08-23 DIAGNOSIS — S0083XA Contusion of other part of head, initial encounter: Secondary | ICD-10-CM | POA: Diagnosis present

## 2019-08-23 DIAGNOSIS — R69 Illness, unspecified: Secondary | ICD-10-CM | POA: Diagnosis not present

## 2019-08-23 DIAGNOSIS — R519 Headache, unspecified: Secondary | ICD-10-CM | POA: Diagnosis not present

## 2019-08-23 DIAGNOSIS — S3993XA Unspecified injury of pelvis, initial encounter: Secondary | ICD-10-CM | POA: Diagnosis not present

## 2019-08-23 DIAGNOSIS — Z7982 Long term (current) use of aspirin: Secondary | ICD-10-CM | POA: Diagnosis not present

## 2019-08-23 DIAGNOSIS — M2550 Pain in unspecified joint: Secondary | ICD-10-CM | POA: Diagnosis not present

## 2019-08-23 DIAGNOSIS — E876 Hypokalemia: Secondary | ICD-10-CM | POA: Diagnosis not present

## 2019-08-23 DIAGNOSIS — Z7401 Bed confinement status: Secondary | ICD-10-CM | POA: Diagnosis not present

## 2019-08-23 DIAGNOSIS — R402411 Glasgow coma scale score 13-15, in the field [EMT or ambulance]: Secondary | ICD-10-CM | POA: Diagnosis not present

## 2019-08-23 DIAGNOSIS — G3183 Dementia with Lewy bodies: Secondary | ICD-10-CM | POA: Diagnosis not present

## 2019-08-23 LAB — PROTIME-INR
INR: 1.1 (ref 0.8–1.2)
Prothrombin Time: 13.6 seconds (ref 11.4–15.2)

## 2019-08-23 LAB — CBC WITH DIFFERENTIAL/PLATELET
Abs Immature Granulocytes: 0.05 10*3/uL (ref 0.00–0.07)
Basophils Absolute: 0 10*3/uL (ref 0.0–0.1)
Basophils Relative: 0 %
Eosinophils Absolute: 0 10*3/uL (ref 0.0–0.5)
Eosinophils Relative: 0 %
HCT: 39.5 % (ref 36.0–46.0)
Hemoglobin: 12.6 g/dL (ref 12.0–15.0)
Immature Granulocytes: 1 %
Lymphocytes Relative: 9 %
Lymphs Abs: 0.9 10*3/uL (ref 0.7–4.0)
MCH: 27.5 pg (ref 26.0–34.0)
MCHC: 31.9 g/dL (ref 30.0–36.0)
MCV: 86.1 fL (ref 80.0–100.0)
Monocytes Absolute: 0.5 10*3/uL (ref 0.1–1.0)
Monocytes Relative: 5 %
Neutro Abs: 8.7 10*3/uL — ABNORMAL HIGH (ref 1.7–7.7)
Neutrophils Relative %: 85 %
Platelets: 235 10*3/uL (ref 150–400)
RBC: 4.59 MIL/uL (ref 3.87–5.11)
RDW: 14.2 % (ref 11.5–15.5)
WBC: 10.2 10*3/uL (ref 4.0–10.5)
nRBC: 0 % (ref 0.0–0.2)

## 2019-08-23 LAB — URINALYSIS, ROUTINE W REFLEX MICROSCOPIC
Bacteria, UA: NONE SEEN
Bilirubin Urine: NEGATIVE
Glucose, UA: NEGATIVE mg/dL
Ketones, ur: 5 mg/dL — AB
Nitrite: NEGATIVE
Protein, ur: NEGATIVE mg/dL
Specific Gravity, Urine: 1.014 (ref 1.005–1.030)
pH: 6 (ref 5.0–8.0)

## 2019-08-23 LAB — COMPREHENSIVE METABOLIC PANEL
ALT: 31 U/L (ref 0–44)
AST: 36 U/L (ref 15–41)
Albumin: 3.2 g/dL — ABNORMAL LOW (ref 3.5–5.0)
Alkaline Phosphatase: 100 U/L (ref 38–126)
Anion gap: 11 (ref 5–15)
BUN: 14 mg/dL (ref 8–23)
CO2: 29 mmol/L (ref 22–32)
Calcium: 8.7 mg/dL — ABNORMAL LOW (ref 8.9–10.3)
Chloride: 104 mmol/L (ref 98–111)
Creatinine, Ser: 0.59 mg/dL (ref 0.44–1.00)
GFR calc Af Amer: >60 mL/min (ref >60)
GFR calc non Af Amer: >60 mL/min (ref >60)
Glucose, Bld: 141 mg/dL — ABNORMAL HIGH (ref 70–99)
Potassium: 2.7 mmol/L — CL (ref 3.5–5.1)
Sodium: 144 mmol/L (ref 135–145)
Total Bilirubin: 1.4 mg/dL — ABNORMAL HIGH (ref 0.3–1.2)
Total Protein: 6.5 g/dL (ref 6.5–8.1)

## 2019-08-23 LAB — HEMOGLOBIN A1C
Hgb A1c MFr Bld: 5.8 % — ABNORMAL HIGH (ref 4.8–5.6)
Mean Plasma Glucose: 119.76 mg/dL

## 2019-08-23 LAB — CK: Total CK: 342 U/L — ABNORMAL HIGH (ref 38–234)

## 2019-08-23 MED ORDER — POTASSIUM CHLORIDE CRYS ER 20 MEQ PO TBCR
20.0000 meq | EXTENDED_RELEASE_TABLET | Freq: Two times a day (BID) | ORAL | Status: DC
  Administered 2019-08-24 – 2019-08-25 (×3): 20 meq via ORAL
  Filled 2019-08-23 (×4): qty 1

## 2019-08-23 MED ORDER — POTASSIUM CHLORIDE CRYS ER 20 MEQ PO TBCR: 40.0000 meq | EXTENDED_RELEASE_TABLET | Freq: Two times a day (BID) | ORAL | Status: DC

## 2019-08-23 MED ORDER — SODIUM CHLORIDE 0.9% FLUSH
3.0000 mL | Freq: Two times a day (BID) | INTRAVENOUS | Status: DC
  Administered 2019-08-23 – 2019-08-27 (×5): 3 mL via INTRAVENOUS

## 2019-08-23 MED ORDER — ONDANSETRON HCL 4 MG PO TABS: 8.0000 mg | ORAL_TABLET | Freq: Three times a day (TID) | ORAL | Status: DC | PRN

## 2019-08-23 MED ORDER — PANTOPRAZOLE SODIUM 40 MG PO TBEC
40.0000 mg | DELAYED_RELEASE_TABLET | Freq: Every day | ORAL | Status: DC
  Administered 2019-08-24 – 2019-08-27 (×4): 40 mg via ORAL
  Filled 2019-08-23 (×5): qty 1

## 2019-08-23 MED ORDER — SODIUM CHLORIDE 0.9 % IV SOLN: 250.0000 mL | INTRAVENOUS | Status: DC | PRN

## 2019-08-23 MED ORDER — POTASSIUM CHLORIDE CRYS ER 20 MEQ PO TBCR
40.0000 meq | EXTENDED_RELEASE_TABLET | Freq: Once | ORAL | Status: AC
  Administered 2019-08-23: 12:00:00 40 meq via ORAL
  Filled 2019-08-23: qty 2

## 2019-08-23 MED ORDER — ACETAMINOPHEN 650 MG RE SUPP: 650.0000 mg | Freq: Four times a day (QID) | RECTAL | Status: DC | PRN

## 2019-08-23 MED ORDER — ASPIRIN 81 MG PO CHEW
40.5000 mg | CHEWABLE_TABLET | Freq: Two times a day (BID) | ORAL | Status: DC
  Administered 2019-08-23 – 2019-08-27 (×9): 40.5 mg via ORAL
  Filled 2019-08-23 (×9): qty 1

## 2019-08-23 MED ORDER — LIDOCAINE HCL (PF) 1 % IJ SOLN
20.0000 mL | Freq: Once | INTRAMUSCULAR | Status: AC
  Administered 2019-08-23: 10:00:00 20 mL
  Filled 2019-08-23: qty 20

## 2019-08-23 MED ORDER — SODIUM CHLORIDE 0.45 % IV SOLN: INTRAVENOUS | Status: DC

## 2019-08-23 MED ORDER — SODIUM CHLORIDE 0.9% FLUSH: 3.0000 mL | INTRAVENOUS | Status: DC | PRN

## 2019-08-23 MED ORDER — ATORVASTATIN CALCIUM 80 MG PO TABS
80.0000 mg | ORAL_TABLET | Freq: Every day | ORAL | Status: DC
  Administered 2019-08-23 – 2019-08-26 (×4): 80 mg via ORAL
  Filled 2019-08-23 (×4): qty 1

## 2019-08-23 MED ORDER — MIRTAZAPINE 30 MG PO TABS
30.0000 mg | ORAL_TABLET | Freq: Every day | ORAL | Status: DC
  Administered 2019-08-23 – 2019-08-26 (×4): 30 mg via ORAL
  Filled 2019-08-23: qty 1
  Filled 2019-08-23 (×2): qty 2
  Filled 2019-08-23: qty 1
  Filled 2019-08-23: qty 2
  Filled 2019-08-23 (×2): qty 1
  Filled 2019-08-23: qty 2
  Filled 2019-08-23: qty 1

## 2019-08-23 MED ORDER — SODIUM CHLORIDE 0.9 % IV BOLUS
500.0000 mL | Freq: Once | INTRAVENOUS | Status: AC
  Administered 2019-08-23: 11:00:00 500 mL via INTRAVENOUS

## 2019-08-23 MED ORDER — POTASSIUM CHLORIDE 10 MEQ/100ML IV SOLN
10.0000 meq | INTRAVENOUS | Status: AC
  Administered 2019-08-23 (×2): 10 meq via INTRAVENOUS
  Filled 2019-08-23 (×2): qty 100

## 2019-08-23 MED ORDER — POTASSIUM CHLORIDE CRYS ER 20 MEQ PO TBCR
40.0000 meq | EXTENDED_RELEASE_TABLET | Freq: Two times a day (BID) | ORAL | Status: AC
  Administered 2019-08-23 (×2): 40 meq via ORAL
  Filled 2019-08-23 (×2): qty 2

## 2019-08-23 MED ORDER — SENNA 8.6 MG PO TABS
1.0000 | ORAL_TABLET | Freq: Two times a day (BID) | ORAL | Status: DC
  Administered 2019-08-23 – 2019-08-27 (×9): 8.6 mg via ORAL
  Filled 2019-08-23 (×9): qty 1

## 2019-08-23 MED ORDER — POTASSIUM CHLORIDE CRYS ER 20 MEQ PO TBCR: 20.0000 meq | EXTENDED_RELEASE_TABLET | Freq: Two times a day (BID) | ORAL | Status: DC

## 2019-08-23 MED ORDER — RIVASTIGMINE 13.3 MG/24HR TD PT24
13.3000 mg | MEDICATED_PATCH | Freq: Every day | TRANSDERMAL | Status: DC
  Administered 2019-08-23 – 2019-08-27 (×5): 13.3 mg via TRANSDERMAL
  Filled 2019-08-23 (×5): qty 1

## 2019-08-23 MED ORDER — ENOXAPARIN SODIUM 40 MG/0.4ML ~~LOC~~ SOLN
40.0000 mg | SUBCUTANEOUS | Status: DC
  Administered 2019-08-23 – 2019-08-26 (×4): 40 mg via SUBCUTANEOUS
  Filled 2019-08-23 (×4): qty 0.4

## 2019-08-23 MED ORDER — MEMANTINE HCL 10 MG PO TABS
10.0000 mg | ORAL_TABLET | Freq: Every day | ORAL | Status: DC
  Administered 2019-08-23 – 2019-08-27 (×5): 10 mg via ORAL
  Filled 2019-08-23 (×6): qty 1

## 2019-08-23 MED ORDER — METOPROLOL TARTRATE 12.5 MG HALF TABLET
12.5000 mg | ORAL_TABLET | Freq: Two times a day (BID) | ORAL | Status: DC
  Administered 2019-08-23 – 2019-08-27 (×9): 12.5 mg via ORAL
  Filled 2019-08-23 (×9): qty 1

## 2019-08-23 MED ORDER — VENLAFAXINE HCL ER 37.5 MG PO CP24
37.5000 mg | ORAL_CAPSULE | Freq: Every day | ORAL | Status: DC
  Administered 2019-08-24 – 2019-08-27 (×4): 37.5 mg via ORAL
  Filled 2019-08-23 (×4): qty 1

## 2019-08-23 MED ORDER — NITROGLYCERIN 0.4 MG SL SUBL: 0.4000 mg | SUBLINGUAL_TABLET | SUBLINGUAL | Status: DC | PRN

## 2019-08-23 MED ORDER — ACETAMINOPHEN 325 MG PO TABS
650.0000 mg | ORAL_TABLET | Freq: Four times a day (QID) | ORAL | Status: DC | PRN
  Administered 2019-08-24 – 2019-08-26 (×3): 650 mg via ORAL
  Filled 2019-08-23 (×3): qty 2

## 2019-08-23 NOTE — Plan of Care (Signed)
  Problem: Safety: Goal: Ability to remain free from injury will improve Outcome: Progressing   

## 2019-08-23 NOTE — ED Notes (Signed)
Son at bedside - hands and face cleaned-- gauze placed over right eye-

## 2019-08-23 NOTE — ED Provider Notes (Signed)
[facility] EMERGENCY DEPARTMENT  Please see supervising physician Dr. Peggye Pitt note for full H&P.   Procedures .Marland KitchenLaceration Repair  Date/Time: 08/23/2019 11:58 AM Performed by: Cherly Anderson, PA-C Authorized by: Cherly Anderson, PA-C   Consent:    Consent obtained:  Verbal   Consent given by:  Patient   Risks discussed:  Infection, need for additional repair, nerve damage, poor wound healing, poor cosmetic result, pain and retained foreign body   Alternatives discussed:  No treatment Anesthesia (see MAR for exact dosages):    Anesthesia method:  Local infiltration   Local anesthetic:  Lidocaine 1% w/o epi Laceration details:    Location:  Lip   Lip location:  Upper lip, full thickness   Vermilion border involved: no     Length (cm):  0.5   Laceration depth: Through and through. Repair type:    Repair type:  Intermediate Pre-procedure details:    Preparation:  Patient was prepped and draped in usual sterile fashion and imaging obtained to evaluate for foreign bodies Exploration:    Hemostasis achieved with:  Direct pressure   Wound exploration: wound explored through full range of motion and entire depth of wound probed and visualized     Contaminated: no   Treatment:    Area cleansed with:  Betadine (betadine externally)   Amount of cleaning:  Standard   Irrigation solution:  Sterile water   Irrigation method:  Pressure wash Mucous membrane repair:    Suture size:  5-0   Suture material:  Vicryl   Suture technique:  Simple interrupted   Number of sutures:  3 Skin repair:    Repair method:  Sutures   Suture size:  5-0   Wound skin closure material used: vicryl rapide.   Suture technique:  Simple interrupted   Number of sutures:  1 Post-procedure details:    Dressing:  Open (no dressing)   Patient tolerance of procedure:  Tolerated well, no immediate complications .Marland KitchenLaceration Repair  Date/Time: 08/23/2019 12:01 PM Performed by:  Cherly Anderson, PA-C Authorized by: Cherly Anderson, PA-C   Consent:    Consent obtained:  Verbal   Consent given by:  Patient   Risks discussed:  Infection, need for additional repair, nerve damage, poor wound healing, poor cosmetic result, pain, retained foreign body, tendon damage and vascular damage   Alternatives discussed:  No treatment Anesthesia (see MAR for exact dosages):    Anesthesia method:  Nerve block   Block location:  L 4th finger   Block needle gauge:  27 G   Block anesthetic:  Lidocaine 1% w/o epi   Block injection procedure:  Anatomic landmarks identified, anatomic landmarks palpated, introduced needle, negative aspiration for blood and incremental injection   Block outcome:  Anesthesia achieved Repair type:    Repair type:  Simple Pre-procedure details:    Preparation:  Patient was prepped and draped in usual sterile fashion Exploration:    Hemostasis achieved with:  Direct pressure   Wound exploration: wound explored through full range of motion and entire depth of wound probed and visualized     Wound extent: no tendon damage noted     Contaminated: no   Treatment:    Area cleansed with:  Betadine   Amount of cleaning:  Standard   Irrigation solution:  Sterile water   Irrigation method:  Pressure wash Skin repair:    Repair method:  Sutures   Suture size:  4-0   Suture material:  Nylon  Suture technique:  Simple interrupted   Number of sutures:  4 Approximation:    Approximation:  Close Post-procedure details:    Dressing:  Adhesive bandage   Patient tolerance of procedure:  Tolerated well, no immediate complications   (including critical care time)  Medications Ordered in ED Medications  lidocaine (PF) (XYLOCAINE) 1 % injection 20 mL (has no administration in time range)      Amaryllis Dyke, PA-C 08/23/19 1204    Tegeler, Gwenyth Allegra, MD 08/23/19 2005

## 2019-08-23 NOTE — ED Provider Notes (Signed)
[facility] EMERGENCY DEPARTMENT Provider Note   CSN: [id number] Arrival date & time: 08/23/19  5176     History Chief Complaint  Patient presents with  . Fall    Tiffany Mack is a 79 y.o. female.  The history is provided by the patient, the EMS personnel and medical records.  Fall This is a recurrent problem. The current episode started 6 to 12 hours ago. The problem occurs every several days. The problem has not changed since onset.Associated symptoms include headaches. Pertinent negatives include no chest pain, no abdominal pain and no shortness of breath. Nothing aggravates the symptoms. Nothing relieves the symptoms. She has tried nothing for the symptoms. The treatment provided no relief.       Past Medical History:  Diagnosis Date  . Anxiety   . Coronary artery disease   . COVID-19 07/26/2019  . Dementia [facility])     Patient Active Problem List   Diagnosis Date Noted  . Pneumonia due to COVID-19 virus 07/28/2019  . Acute cystitis without hematuria   . Acute encephalopathy 07/26/2019  . Pressure injury of skin 07/26/2019  . CAP (community acquired pneumonia) 07/26/2019  . Hypokalemia 07/26/2019  . Acute lower UTI 07/26/2019  . Depression 07/26/2019  . Malnutrition of moderate degree 03/24/2018  . 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 Vomiting 03/21/2018  . HTN (hypertension) 03/21/2018  . Dementia (HCC) 03/21/2018  . CAD (coronary artery disease) 03/21/2018  . Acute non Q wave MI (myocardial infarction), initial episode of care [facility]) 08/04/2015    Past Surgical History:  Procedure Laterality Date  . CARDIAC CATHETERIZATION N/A 08/04/2015   Procedure: Left Heart Cath and Coronary Angiography;  Surgeon: Rinaldo Cloud, MD;  Location: [facility] INVASIVE CV LAB;  Service: Cardiovascular;  Laterality: N/A;  . CARDIAC CATHETERIZATION N/A 08/08/2015   Procedure: Coronary Stent Intervention;  Surgeon: Rinaldo Cloud, MD;  Location: [facility] INVASIVE CV LAB;   Service: Cardiovascular;  Laterality: N/A;     OB History   No obstetric history on file.     No family history on file.  Social History   Tobacco Use  . Smoking status: Never Smoker  . Smokeless tobacco: Never Used  Substance Use Topics  . Alcohol use: No  . Drug use: No    Home Medications Prior to Admission medications   Medication Sig Start Date End Date Taking? Authorizing Provider  aspirin EC 81 MG EC tablet Take 1 tablet (81 mg total) by mouth daily. Patient taking differently: Take 40.5 mg by mouth 2 (two) times daily.  08/11/15   Rinaldo Cloud, MD  atorvastatin (LIPITOR) 80 MG tablet Take 1 tablet (80 mg total) by mouth daily at 6 PM. 08/11/15   Rinaldo Cloud, MD  cefdinir (OMNICEF) 300 MG capsule Take 1 capsule (300 mg total) by mouth every 12 (twelve) hours. 07/29/19   Catarina Hartshorn, MD  dexamethasone (DECADRON) 6 MG tablet Take 1 tablet (6 mg total) by mouth daily. 07/29/19   Catarina Hartshorn, MD  memantine (NAMENDA) 10 MG tablet Take 1 tablet every night for 2 weeks, then increase to 1 tablet twice a day Patient taking differently: Take 10 mg by mouth 2 (two) times daily.  05/26/19   Van Clines, MD  metoprolol tartrate (LOPRESSOR) 25 MG tablet Take 0.5 tablets (12.5 mg total) by mouth 2 (two) times daily. Patient taking differently: Take 12.5 mg by mouth daily.  08/11/15   Rinaldo Cloud, MD  mirtazapine (REMERON) 30 MG tablet Take 30 mg by  mouth daily.  10/08/18   [provider]  nitroGLYCERIN (NITROSTAT) 0.4 MG SL tablet Place 1 tablet (0.4 mg total) under the tongue every 5 (five) minutes as needed for chest pain (CP or SOB). 08/11/15   [name], Mohan, MD  ondansetron (ZOFRAN) 8 MG tablet Take 8 mg by mouth every 8 (eight) hours as needed for nausea/vomiting. 03/17/18   [provider]  pantoprazole (PROTONIX) 40 MG tablet Take 1 tablet (40 mg total) by mouth daily at 6 (six) AM. 08/11/15   [name], Mohan, MD  phosphorus (K PHOS NEUTRAL) [phone] MG  tablet Take 1 tablet (250 mg total) by mouth 4 (four) times daily -  before meals and at bedtime. 07/29/19   Catarina Hartshornat, David, MD  rivastigmine (EXELON) 13.3 MG/24HR Place 13.3 mg onto the skin daily.  06/22/19   [provider]  rivastigmine (EXELON) 9.5 mg/24hr Place 13.3 mg onto the skin daily.     [provider]  venlafaxine XR (EFFEXOR-XR) 37.5 MG 24 hr capsule Take 37.5 mg by mouth daily with breakfast.    [provider]    Allergies    Patient has no known allergies.  Review of Systems   Review of Systems  Constitutional: Negative for chills, diaphoresis, fatigue and fever.  HENT: Negative for congestion.   Eyes: Negative for visual disturbance.  Respiratory: Negative for cough, chest tightness, shortness of breath and wheezing.   Cardiovascular: Negative for chest pain and palpitations.  Gastrointestinal: Negative for abdominal pain, constipation, diarrhea, nausea and vomiting.  Genitourinary: Negative for dysuria and flank pain.  Musculoskeletal: Negative for back pain, neck pain and neck stiffness.  Skin: Positive for wound.  Neurological: Positive for headaches. Negative for dizziness, weakness, light-headedness and numbness.  Psychiatric/Behavioral: Positive for confusion and hallucinations. Negative for agitation.  All other systems reviewed and are negative.   Physical Exam Updated Vital Signs BP (!) 166/110 (BP Location: Right Arm)   Pulse 97   Temp 98.1 F (36.7 C) (Oral)   Resp 20   Ht 5\' 9"  (1.753 m)   Wt 72.6 kg   SpO2 97%   BMI 23.63 kg/m   Physical Exam Vitals and nursing note reviewed.  Constitutional:      General: She is not in acute distress.    Appearance: She is well-developed. She is not ill-appearing, toxic-appearing or diaphoretic.  HENT:     Head: Abrasion, contusion and laceration present.      Right Ear: External ear normal.     Left Ear: External ear normal.     Nose: Nose normal. No congestion or rhinorrhea.      Mouth/Throat:     Mouth: Mucous membranes are moist.     Pharynx: No oropharyngeal exudate or posterior oropharyngeal erythema.  Eyes:     Conjunctiva/sclera: Conjunctivae normal.     Pupils: Pupils are equal, round, and reactive to light.  Cardiovascular:     Rate and Rhythm: Normal rate. Rhythm irregular.     Pulses: Normal pulses.     Heart sounds: No murmur.  Pulmonary:     Effort: Pulmonary effort is normal. No respiratory distress.     Breath sounds: No stridor. No wheezing or rales.  Chest:     Chest wall: No tenderness.  Abdominal:     General: Abdomen is flat. There is no distension.     Tenderness: There is no abdominal tenderness. There is no right CVA tenderness, left CVA tenderness or rebound.  Musculoskeletal:  General: Tenderness and signs of injury present.     Left hand: Laceration present.     Cervical back: Normal range of motion and neck supple.     Right lower leg: No edema.     Left lower leg: No edema.     Comments: Laceration on L finger. Hemostatic after pressure. Normal grip strength and sensation. Normal pulses. Normal cap refill. No snuff box tenderness.   Skin:    General: Skin is warm.     Capillary Refill: Capillary refill takes 2 to 3 seconds.     Findings: No erythema or rash.  Neurological:     General: No focal deficit present.     Mental Status: She is alert.     Sensory: No sensory deficit.     Motor: No weakness or abnormal muscle tone.     Coordination: Coordination normal.     Deep Tendon Reflexes: Reflexes are normal and symmetric.  Psychiatric:        Attention and Perception: She perceives auditory and visual hallucinations.        Mood and Affect: Mood is not anxious.        Behavior: Behavior is not agitated.     Comments: Per son pt is hallucinating and talking to people who arent there.      ED Results / Procedures / Treatments   Labs (all labs ordered are listed, but only abnormal results are displayed) Labs  Reviewed  CBC WITH DIFFERENTIAL/PLATELET - Abnormal; Notable for the following components:      Result Value   Neutro Abs 8.7 (*)    All other components within normal limits  COMPREHENSIVE METABOLIC PANEL - Abnormal; Notable for the following components:   Potassium 2.7 (*)    Glucose, Bld 141 (*)    Calcium 8.7 (*)    Albumin 3.2 (*)    Total Bilirubin 1.4 (*)    All other components within normal limits  CK - Abnormal; Notable for the following components:   Total CK 342 (*)    All other components within normal limits  URINALYSIS, ROUTINE W REFLEX MICROSCOPIC - Abnormal; Notable for the following components:   Hgb urine dipstick SMALL (*)    Ketones, ur 5 (*)    Leukocytes,Ua TRACE (*)    All other components within normal limits  HEMOGLOBIN A1C - Abnormal; Notable for the following components:   Hgb A1c MFr Bld 5.8 (*)    All other components within normal limits  URINE CULTURE  PROTIME-INR    EKG EKG Interpretation  Date/Time:  Monday August 23 2019 09:00:10 EST Ventricular Rate:  97 PR Interval:    QRS Duration: 89 QT Interval:  394 QTC Calculation: 501 R Axis:   24 Text Interpretation: Sinus rhythm Prolonged QT interval When compared to prior, faster rate and slightly longer QTc. No STEMI Confirmed by Antony Blackbird [phone]) on 08/23/2019 9:18:30 AM   Radiology CT Head Wo Contrast  Result Date: 08/23/2019 CLINICAL DATA:  Headache, found at bottom of stairs, confusion EXAM: CT HEAD WITHOUT CONTRAST CT MAXILLOFACIAL WITHOUT CONTRAST CT CERVICAL SPINE WITHOUT CONTRAST TECHNIQUE: Multidetector CT imaging of the head, cervical spine, and maxillofacial structures were performed using the standard protocol without intravenous contrast. Multiplanar CT image reconstructions of the cervical spine and maxillofacial structures were also generated. COMPARISON:  07/26/2019 FINDINGS: CT HEAD FINDINGS Brain: No evidence of acute infarction, hemorrhage, hydrocephalus, extra-axial  collection or mass lesion/mass effect. Periventricular and deep white matter hypodensity. Vascular: No  hyperdense vessel or unexpected calcification. CT FACIAL BONES FINDINGS Skull: Normal. Negative for fracture or focal lesion. Facial bones: No displaced fractures or dislocations. Sinuses/Orbits: No acute finding. Other: Large soft tissue laceration and hematoma of the forehead and right cheek. CT CERVICAL SPINE FINDINGS Alignment: Normal. Skull base and vertebrae: No acute fracture. No primary bone lesion or focal pathologic process. Soft tissues and spinal canal: No prevertebral fluid or swelling. No visible canal hematoma. Disc levels: Moderate multilevel disc space height loss and osteophytosis. Upper chest: Negative. Other: None. IMPRESSION: 1. No acute intracranial pathology. Small-vessel white matter disease. 2.  No displaced fracture or dislocation of the facial bones. 3. Large soft tissue laceration and hematoma of the forehead and right cheek. 4.  No fracture or static subluxation of the cervical spine. Electronically Signed   By: Lauralyn Primes M.D.   On: 08/23/2019 10:41   CT Cervical Spine Wo Contrast  Result Date: 08/23/2019 CLINICAL DATA:  Headache, found at bottom of stairs, confusion EXAM: CT HEAD WITHOUT CONTRAST CT MAXILLOFACIAL WITHOUT CONTRAST CT CERVICAL SPINE WITHOUT CONTRAST TECHNIQUE: Multidetector CT imaging of the head, cervical spine, and maxillofacial structures were performed using the standard protocol without intravenous contrast. Multiplanar CT image reconstructions of the cervical spine and maxillofacial structures were also generated. COMPARISON:  07/26/2019 FINDINGS: CT HEAD FINDINGS Brain: No evidence of acute infarction, hemorrhage, hydrocephalus, extra-axial collection or mass lesion/mass effect. Periventricular and deep white matter hypodensity. Vascular: No hyperdense vessel or unexpected calcification. CT FACIAL BONES FINDINGS Skull: Normal. Negative for fracture or focal  lesion. Facial bones: No displaced fractures or dislocations. Sinuses/Orbits: No acute finding. Other: Large soft tissue laceration and hematoma of the forehead and right cheek. CT CERVICAL SPINE FINDINGS Alignment: Normal. Skull base and vertebrae: No acute fracture. No primary bone lesion or focal pathologic process. Soft tissues and spinal canal: No prevertebral fluid or swelling. No visible canal hematoma. Disc levels: Moderate multilevel disc space height loss and osteophytosis. Upper chest: Negative. Other: None. IMPRESSION: 1. No acute intracranial pathology. Small-vessel white matter disease. 2.  No displaced fracture or dislocation of the facial bones. 3. Large soft tissue laceration and hematoma of the forehead and right cheek. 4.  No fracture or static subluxation of the cervical spine. Electronically Signed   By: Lauralyn Primes M.D.   On: 08/23/2019 10:41   DG Pelvis Portable  Result Date: 08/23/2019 CLINICAL DATA:  Fall down multiple stairs EXAM: PORTABLE PELVIS 1-2 VIEWS COMPARISON:  None. FINDINGS: There is no evidence of pelvic fracture or diastasis. No pelvic bone lesions are seen. IMPRESSION: Negative. Electronically Signed   By: Duanne Guess D.O.   On: 08/23/2019 09:43   DG Chest Portable 1 View  Result Date: 08/23/2019 CLINICAL DATA:  Fall down stairs. Reportedly negative for chest pain. EXAM: PORTABLE CHEST 1 VIEW COMPARISON:  07/26/2019 FINDINGS: Normal heart size and mediastinal contours. Coronary stenting. There is no edema, consolidation, effusion, or pneumothorax. Limited by degree of artifact from EKG leads. There is some undulation of bilateral ribs that is not definite for fracture when accounting for overlap on prior IMPRESSION: No evidence of acute cardiopulmonary disease. Electronically Signed   By: Marnee Spring M.D.   On: 08/23/2019 09:44   DG Hand Complete Left  Result Date: 08/23/2019 CLINICAL DATA:  Fall down stairs EXAM: LEFT HAND - COMPLETE 3+ VIEW COMPARISON:   None. FINDINGS: Alignment is anatomic. No acute fracture. There are degenerative changes at the fifth distal interphalangeal joint. IMPRESSION: No acute fracture or  malalignment. Electronically Signed   By: Guadlupe SpanishPraneil  Patel M.D.   On: 08/23/2019 09:42   CT Maxillofacial Wo Contrast  Result Date: 08/23/2019 CLINICAL DATA:  Headache, found at bottom of stairs, confusion EXAM: CT HEAD WITHOUT CONTRAST CT MAXILLOFACIAL WITHOUT CONTRAST CT CERVICAL SPINE WITHOUT CONTRAST TECHNIQUE: Multidetector CT imaging of the head, cervical spine, and maxillofacial structures were performed using the standard protocol without intravenous contrast. Multiplanar CT image reconstructions of the cervical spine and maxillofacial structures were also generated. COMPARISON:  07/26/2019 FINDINGS: CT HEAD FINDINGS Brain: No evidence of acute infarction, hemorrhage, hydrocephalus, extra-axial collection or mass lesion/mass effect. Periventricular and deep white matter hypodensity. Vascular: No hyperdense vessel or unexpected calcification. CT FACIAL BONES FINDINGS Skull: Normal. Negative for fracture or focal lesion. Facial bones: No displaced fractures or dislocations. Sinuses/Orbits: No acute finding. Other: Large soft tissue laceration and hematoma of the forehead and right cheek. CT CERVICAL SPINE FINDINGS Alignment: Normal. Skull base and vertebrae: No acute fracture. No primary bone lesion or focal pathologic process. Soft tissues and spinal canal: No prevertebral fluid or swelling. No visible canal hematoma. Disc levels: Moderate multilevel disc space height loss and osteophytosis. Upper chest: Negative. Other: None. IMPRESSION: 1. No acute intracranial pathology. Small-vessel white matter disease. 2.  No displaced fracture or dislocation of the facial bones. 3. Large soft tissue laceration and hematoma of the forehead and right cheek. 4.  No fracture or static subluxation of the cervical spine. Electronically Signed   By: Lauralyn PrimesAlex  Bibbey  M.D.   On: 08/23/2019 10:41    Procedures Procedures (including critical care time)  Medications Ordered in ED Medications  aspirin chewable tablet 40.5 mg (40.5 mg Oral Given 08/23/19 1730)  atorvastatin (LIPITOR) tablet 80 mg (80 mg Oral Given 08/23/19 1720)  metoprolol tartrate (LOPRESSOR) tablet 12.5 mg (12.5 mg Oral Given 08/23/19 1719)  nitroGLYCERIN (NITROSTAT) SL tablet 0.4 mg (has no administration in time range)  memantine (NAMENDA) tablet 10 mg (has no administration in time range)  mirtazapine (REMERON) tablet 30 mg (has no administration in time range)  rivastigmine (EXELON) 13.3 MG/24HR 13.3 mg (13.3 mg Transdermal Patch Applied 08/23/19 1721)  venlafaxine XR (EFFEXOR-XR) 24 hr capsule 37.5 mg (has no administration in time range)  ondansetron (ZOFRAN) tablet 8 mg (has no administration in time range)  pantoprazole (PROTONIX) EC tablet 40 mg (has no administration in time range)  enoxaparin (LOVENOX) injection 40 mg (40 mg Subcutaneous Given 08/23/19 1721)  sodium chloride flush (NS) 0.9 % injection 3 mL (3 mLs Intravenous Given 08/23/19 1724)  sodium chloride flush (NS) 0.9 % injection 3 mL (has no administration in time range)  0.9 %  sodium chloride infusion (has no administration in time range)  acetaminophen (TYLENOL) tablet 650 mg (has no administration in time range)    Or  acetaminophen (TYLENOL) suppository 650 mg (has no administration in time range)  senna (SENOKOT) tablet 8.6 mg (8.6 mg Oral Given 08/23/19 1720)  0.45 % sodium chloride infusion ( Intravenous New Bag/Given 08/23/19 1719)  potassium chloride SA (KLOR-CON) CR tablet 40 mEq (40 mEq Oral Given 08/23/19 1720)    Followed by  potassium chloride SA (KLOR-CON) CR tablet 20 mEq (has no administration in time range)  lidocaine (PF) (XYLOCAINE) 1 % injection 20 mL (20 mLs Infiltration Given 08/23/19 1019)  potassium chloride 10 mEq in 100 mL IVPB (0 mEq Intravenous Stopped 08/23/19 1300)  potassium chloride SA  (KLOR-CON) CR tablet 40 mEq (40 mEq Oral Given 08/23/19 1147)  sodium chloride  0.9 % bolus 500 mL (500 mLs Intravenous New Bag/Given 08/23/19 1044)    ED Course  I have reviewed the triage vital signs and the nursing notes.  Pertinent labs & imaging results that were available during my care of the patient were reviewed by me and considered in my medical decision making (see chart for details).    MDM Rules/Calculators/A&P                      Tarri Guilfoil is a 79 y.o. female with a past medical history significant for CAD, hypertension, recent UTI and COVID-19 infection last month, dementia, and depression who presents for a fall.  According to EMS, patient was found at the bottom of stairs this morning around 7:30 AM.  Patient does not member the fall but she does not think she was on the ground for very long.  Patient was last seen in bed around 3:30 AM and lives at home with a family member.  Patient is reporting some facial pain and some pain in her head.  She reports the pain is mild.  Patient is right-handed.  She denies any chest pain or other extremity pains.  On exam, patient was found to have bruising and swelling to her right face.  Her pupils were symmetric and reactive with normal extraocular movements.  Patient had no evidence of nasal septal hematoma.  Her right face was tender and swollen.  No evidence of hemotympanum bilaterally.  Neck was nontender but patient arrived in a cervical immobilization collar.  Facial sensation intact.  Symmetric grip strength and leg strength.  Normal sensation in all extremities.  Chest and abdomen nontender.  Lungs clear.  Abdomen nontender.  Good pulses in extremities.  No snuffbox tenderness on left.  Patient has a small laceration to her left ring finger.  Unsure of her last tetanus shot.  Will update today.  Patient will have work-up including CT of her head, neck, and face.  Will get chest x-ray and pelvis x-ray given the possible fall  down 12 stairs.  Will get x-ray of the hand to look for foreign body with her laceration on her finger.  Tetanus will be updated.  Will get screening labs.  Anticipate reassessment after imaging to determine disposition.  Will likely need to repair lacerations.  1:43 PM Lacerations were repaired by PA team.  The injury under the eye appears more abrasion like and does not seem is conducive to sutures at this time.  All wounds were washed out and cleaned.  Tetanus updated.  Work-up began to return showing recurrent hypokalemia.  Potassium was in the 2 range.  She was given both IV and oral potassium.  CBC reassuring with no leukocytosis or anemia.  CK slightly elevated at 342 but do not feel she has acute rhabdo at this time.  Urinalysis also does not show nitrites or bacteria, doubt continued UTI.  CT imaging of the head, neck, and face did not show any fractures or dislocation.  Suspect concussion but no bleed seen.  Patient does have hematomas and laceration seen.  Chest x-ray, pelvis x-ray, and hand x-ray reassuring.  Patient's son arrived and was advised further admission.  He reports that 3 times a week patient is ended up on the floor with falls.  She was also more encephalopathic and delirious yesterday.  She is been talking to people that were not there and having hallucinations.  This is how she was when she had to  be admitted several weeks ago for Covid and UTI.  He was suspecting she would have UTI recurrence but it was negative.  Son is concerned about patient going home given her multiple falls including the fall down 12 stairs today and her continued intermittent encephalopathy.  This could be related to her Covid however I do not see evidence of meningitis or encephalitis at this time.  She did not have focal neurologic deficits.  For the falls, delirium with hallucinations, and the hypokalemia, will call for admission.   Final Clinical Impression(s) / ED Diagnoses Final diagnoses:    Fall, initial encounter  Hypokalemia  Delirium  Hallucinations     Clinical Impression: 1. Fall, initial encounter   2. Hypokalemia   3. Delirium   4. Hallucinations     Disposition: Admit  This note was prepared with assistance of Dragon voice recognition software. Occasional wrong-word or sound-a-like substitutions may have occurred due to the inherent limitations of voice recognition software.     Earon Rivest, Canary Brim, MD 08/23/19 2005

## 2019-08-23 NOTE — ED Triage Notes (Signed)
To ED via [facility] from son's house-- was last seen in bed at 0330-- found at bottom of stairs this am at 0730. Pt has hx of dementia/alzheimers-- confusion as to time frame and event-  Swelling to right eye/forehead area-- speech clear, able to move all extremities.

## 2019-08-23 NOTE — H&P (Signed)
History and Physical    Tiffany Mack UXL:[id number] DOB: April 10, 1941 DOA: 08/23/2019  PCP: Mila Palmer, MD (Confirm with patient/family/NH records and if not entered, this has to be entered at [facility] point of entry) Patient coming from: Patient coming from home  I have personally briefly reviewed patient's old medical records in [facility]  Chief Complaint: Larey Seat down a flight of 12 stairs  HPI: Tiffany Mack is a 79 y.o. female with medical history significant of CAD, dementia, recent admission for covid 19 PNA and UTI discharged 07/26/19. By her son's report she has been having increased confusion and hallucinations. She has had multiple falls and he has found her on the floor in disarray several times. Today he found her at the foot of the stairs after a fall. She was brought to [facility] ED for evaluation of trauma. (For level 3, the HPI must include 4+ descriptors: Location, Quality, Severity, Duration, Timing, Context, modifying factors, associated signs/symptoms and/or status of 3+ chronic problems.)  (Please avoid self-populating past medical history here) (The initial 2-3 lines should be focused and good to copy and paste in the HPI section of the daily progress note).  ED Course: In the ED she was hemodynamically stable. CT imaging done of head, neck and face. There were no fractures. Patient had severe right facial bruising and abrasions with bleeding, a through and through laceration of the upper lip which was sutured. Lab revealed a potassium 2.7. She was started on Potassium replacement. Her son did c/o increasing disorientation, hallucination, falls that is accelerating. [facility] was called to admit patient for potassium replacement and on-going evaluation.  Review of Systems: As per HPI otherwise 10 point review of systems negative.  Unacceptable ROS statements: "10 systems reviewed," "Extensive" (without elaboration).  Acceptable ROS statements: "All others negative,"  "All others reviewed and are negative," and "All others unremarkable," with at LEAST ONE ROS documented Can't double dip - if using for HPI can't use for ROS  Past Medical History:  Diagnosis Date  . Anxiety   . Coronary artery disease   . COVID-19 07/26/2019  . Dementia [facility])     Past Surgical History:  Procedure Laterality Date  . CARDIAC CATHETERIZATION N/A 08/04/2015   Procedure: Left Heart Cath and Coronary Angiography;  Surgeon: Rinaldo Cloud, MD;  Location: [facility] INVASIVE CV LAB;  Service: Cardiovascular;  Laterality: N/A;  . CARDIAC CATHETERIZATION N/A 08/08/2015   Procedure: Coronary Stent Intervention;  Surgeon: Rinaldo Cloud, MD;  Location: [facility] INVASIVE CV LAB;  Service: Cardiovascular;  Laterality: N/A;   Social Hx - patient was married, now single. She has one son, who lives nearby, and one daughter tin [place]. She has 3 grandchildren. She worked as an Aeronautical engineer, now retired. She lives alone in her own home, which she owns.   reports that she has never smoked. She has never used smokeless tobacco. She reports that she does not drink alcohol or use drugs.  No Known Allergies  History reviewed. No pertinent family history. Unacceptable: Noncontributory, unremarkable, or negative. Acceptable: Family history reviewed and not pertinent (If you reviewed it)  Prior to Admission medications   Medication Sig Start Date End Date Taking? Authorizing Provider  aspirin EC 81 MG EC tablet Take 1 tablet (81 mg total) by mouth daily. Patient taking differently: Take 40.5 mg by mouth 2 (two) times daily.  08/11/15  Yes Rinaldo Cloud, MD  atorvastatin (LIPITOR) 80 MG tablet Take 1 tablet (80 mg total) by mouth daily at  6 PM. 08/11/15  Yes Rinaldo Cloud, MD  memantine (NAMENDA) 10 MG tablet Take 1 tablet every night for 2 weeks, then increase to 1 tablet twice a day Patient taking differently: Take 10 mg by mouth daily.  05/26/19  Yes Van Clines, MD  metoprolol  tartrate (LOPRESSOR) 25 MG tablet Take 0.5 tablets (12.5 mg total) by mouth 2 (two) times daily. Patient taking differently: Take 12.5 mg by mouth 2 (two) times daily.  08/11/15  Yes Rinaldo Cloud, MD  mirtazapine (REMERON) 30 MG tablet Take 30 mg by mouth at bedtime.  10/08/18  Yes [provider]  nitroGLYCERIN (NITROSTAT) 0.4 MG SL tablet Place 1 tablet (0.4 mg total) under the tongue every 5 (five) minutes as needed for chest pain (CP or SOB). 08/11/15  Yes Rinaldo Cloud, MD  ondansetron (ZOFRAN) 8 MG tablet Take 8 mg by mouth every 8 (eight) hours as needed for nausea/vomiting. 03/17/18  Yes [provider]  pantoprazole (PROTONIX) 40 MG tablet Take 1 tablet (40 mg total) by mouth daily at 6 (six) AM. 08/11/15  Yes Rinaldo Cloud, MD  rivastigmine (EXELON) 13.3 MG/24HR Place 13.3 mg onto the skin daily.  06/22/19  Yes [provider]  venlafaxine XR (EFFEXOR-XR) 37.5 MG 24 hr capsule Take 37.5 mg by mouth daily with breakfast.   Yes [provider]  cefdinir (OMNICEF) 300 MG capsule Take 1 capsule (300 mg total) by mouth every 12 (twelve) hours. Patient not taking: Reported on 08/23/2019 07/29/19   TatOnalee Hua, MD  dexamethasone (DECADRON) 6 MG tablet Take 1 tablet (6 mg total) by mouth daily. Patient not taking: Reported on 08/23/2019 07/29/19   Catarina Hartshorn, MD  phosphorus (K PHOS NEUTRAL) [phone] MG tablet Take 1 tablet (250 mg total) by mouth 4 (four) times daily -  before meals and at bedtime. Patient not taking: Reported on 08/23/2019 07/29/19   Catarina Hartshorn, MD    Physical Exam: Vitals:   08/23/19 1120 08/23/19 1140 08/23/19 1200 08/23/19 1300  BP: (!) 150/85 (!) 148/64 (!) 113/98 (!) 148/113  Pulse:  91  (!) 150  Resp: 18 17 (!) 22 17  Temp:      TempSrc:      SpO2:  98%  99%  Weight:      Height:        Constitutional: NAD, calm, comfortable Vitals:   08/23/19 1120 08/23/19 1140 08/23/19 1200 08/23/19 1300  BP: (!) 150/85 (!) 148/64 (!)  113/98 (!) 148/113  Pulse:  91  (!) 150  Resp: 18 17 (!) 22 17  Temp:      TempSrc:      SpO2:  98%  99%  Weight:      Height:       General -  WNWD woman I no distress but badly bruised. Eyes: Marked periorbital swelling right with closed eye. Left eye appears normal. PERRL,  ENMT: Mucous membranes are moist. Posterior pharynx clear of any exudate or lesions.Normal dentition.  Neck: normal, supple, no masses, no thyromegaly Respiratory: clear to auscultation bilaterally, no wheezing, no crackles. Normal respiratory effort. No accessory muscle use.  Cardiovascular: Regular rate and rhythm, no murmurs / rubs / gallops. No extremity edema. 2+ pedal pulses. No carotid bruits.  Abdomen: no tenderness, no masses palpated. No hepatosplenomegaly. Bowel sounds positive.  Musculoskeletal: no clubbing / cyanosis. No joint deformity upper and lower extremities. Good ROM, no contractures. Normal muscle tone.  Skin: no rashes, lesions, ulcers. Marked bruising and edema right  periorbital area. Healing sacral decubitus ulcer with 2 mm opening but no drainage, no granulation tissue.  Neurologic: CN 2-12 grossly intact, but exam of right eye limited by swelling.. Sensation intact, . Strength 5/5 in all 4.  Psychiatric: Positive mood, not oriented to place, oriented to person. No insight to current injury or to her chronic condition, I.e. dementia.   (Anything < 9 systems with 2 bullets each down codes to level 1) (If patient refuses exam can't bill higher level) (Make sure to document decubitus ulcers present on admission -- if possible -- and whether patient has chronic indwelling catheter at time of admission)  Labs on Admission: I have personally reviewed following labs and imaging studies  CBC: Recent Labs  Lab 08/23/19 0924  WBC 10.2  NEUTROABS 8.7*  HGB 12.6  HCT 39.5  MCV 86.1  PLT 235   Basic Metabolic Panel: Recent Labs  Lab 08/23/19 0924  NA 144  K 2.7*  CL 104  CO2 29  GLUCOSE  141*  BUN 14  CREATININE 0.59  CALCIUM 8.7*   GFR: Estimated Creatinine Clearance: 60.6 mL/min (by C-G formula based on SCr of 0.59 mg/dL). Liver Function Tests: Recent Labs  Lab 08/23/19 0924  AST 36  ALT 31  ALKPHOS 100  BILITOT 1.4*  PROT 6.5  ALBUMIN 3.2*   No results for input(s): LIPASE, AMYLASE in the last 168 hours. No results for input(s): AMMONIA in the last 168 hours. Coagulation Profile: Recent Labs  Lab 08/23/19 0924  INR 1.1   Cardiac Enzymes: Recent Labs  Lab 08/23/19 0924  CKTOTAL 342*   BNP (last 3 results) No results for input(s): PROBNP in the last 8760 hours. HbA1C: No results for input(s): HGBA1C in the last 72 hours. CBG: No results for input(s): GLUCAP in the last 168 hours. Lipid Profile: No results for input(s): CHOL, HDL, LDLCALC, TRIG, CHOLHDL, LDLDIRECT in the last 72 hours. Thyroid Function Tests: No results for input(s): TSH, T4TOTAL, FREET4, T3FREE, THYROIDAB in the last 72 hours. Anemia Panel: No results for input(s): VITAMINB12, FOLATE, FERRITIN, TIBC, IRON, RETICCTPCT in the last 72 hours. Urine analysis:    Component Value Date/Time   COLORURINE YELLOW 08/23/2019 0924   APPEARANCEUR CLEAR 08/23/2019 0924   LABSPEC 1.014 08/23/2019 0924   PHURINE 6.0 08/23/2019 0924   GLUCOSEU NEGATIVE 08/23/2019 0924   HGBUR SMALL (A) 08/23/2019 0924   BILIRUBINUR NEGATIVE 08/23/2019 0924   KETONESUR 5 (A) 08/23/2019 0924   PROTEINUR NEGATIVE 08/23/2019 0924   NITRITE NEGATIVE 08/23/2019 0924   LEUKOCYTESUR TRACE (A) 08/23/2019 0924    Radiological Exams on Admission: CT Head Wo Contrast  Result Date: 08/23/2019 CLINICAL DATA:  Headache, found at bottom of stairs, confusion EXAM: CT HEAD WITHOUT CONTRAST CT MAXILLOFACIAL WITHOUT CONTRAST CT CERVICAL SPINE WITHOUT CONTRAST TECHNIQUE: Multidetector CT imaging of the head, cervical spine, and maxillofacial structures were performed using the standard protocol without intravenous contrast.  Multiplanar CT image reconstructions of the cervical spine and maxillofacial structures were also generated. COMPARISON:  07/26/2019 FINDINGS: CT HEAD FINDINGS Brain: No evidence of acute infarction, hemorrhage, hydrocephalus, extra-axial collection or mass lesion/mass effect. Periventricular and deep white matter hypodensity. Vascular: No hyperdense vessel or unexpected calcification. CT FACIAL BONES FINDINGS Skull: Normal. Negative for fracture or focal lesion. Facial bones: No displaced fractures or dislocations. Sinuses/Orbits: No acute finding. Other: Large soft tissue laceration and hematoma of the forehead and right cheek. CT CERVICAL SPINE FINDINGS Alignment: Normal. Skull base and vertebrae: No acute fracture. No primary  bone lesion or focal pathologic process. Soft tissues and spinal canal: No prevertebral fluid or swelling. No visible canal hematoma. Disc levels: Moderate multilevel disc space height loss and osteophytosis. Upper chest: Negative. Other: None. IMPRESSION: 1. No acute intracranial pathology. Small-vessel white matter disease. 2.  No displaced fracture or dislocation of the facial bones. 3. Large soft tissue laceration and hematoma of the forehead and right cheek. 4.  No fracture or static subluxation of the cervical spine. Electronically Signed   By: Eddie Candle M.D.   On: 08/23/2019 10:41   CT Cervical Spine Wo Contrast  Result Date: 08/23/2019 CLINICAL DATA:  Headache, found at bottom of stairs, confusion EXAM: CT HEAD WITHOUT CONTRAST CT MAXILLOFACIAL WITHOUT CONTRAST CT CERVICAL SPINE WITHOUT CONTRAST TECHNIQUE: Multidetector CT imaging of the head, cervical spine, and maxillofacial structures were performed using the standard protocol without intravenous contrast. Multiplanar CT image reconstructions of the cervical spine and maxillofacial structures were also generated. COMPARISON:  07/26/2019 FINDINGS: CT HEAD FINDINGS Brain: No evidence of acute infarction, hemorrhage,  hydrocephalus, extra-axial collection or mass lesion/mass effect. Periventricular and deep white matter hypodensity. Vascular: No hyperdense vessel or unexpected calcification. CT FACIAL BONES FINDINGS Skull: Normal. Negative for fracture or focal lesion. Facial bones: No displaced fractures or dislocations. Sinuses/Orbits: No acute finding. Other: Large soft tissue laceration and hematoma of the forehead and right cheek. CT CERVICAL SPINE FINDINGS Alignment: Normal. Skull base and vertebrae: No acute fracture. No primary bone lesion or focal pathologic process. Soft tissues and spinal canal: No prevertebral fluid or swelling. No visible canal hematoma. Disc levels: Moderate multilevel disc space height loss and osteophytosis. Upper chest: Negative. Other: None. IMPRESSION: 1. No acute intracranial pathology. Small-vessel white matter disease. 2.  No displaced fracture or dislocation of the facial bones. 3. Large soft tissue laceration and hematoma of the forehead and right cheek. 4.  No fracture or static subluxation of the cervical spine. Electronically Signed   By: Eddie Candle M.D.   On: 08/23/2019 10:41   DG Pelvis Portable  Result Date: 08/23/2019 CLINICAL DATA:  Fall down multiple stairs EXAM: PORTABLE PELVIS 1-2 VIEWS COMPARISON:  None. FINDINGS: There is no evidence of pelvic fracture or diastasis. No pelvic bone lesions are seen. IMPRESSION: Negative. Electronically Signed   By: Davina Poke D.O.   On: 08/23/2019 09:43   DG Chest Portable 1 View  Result Date: 08/23/2019 CLINICAL DATA:  Fall down stairs. Reportedly negative for chest pain. EXAM: PORTABLE CHEST 1 VIEW COMPARISON:  07/26/2019 FINDINGS: Normal heart size and mediastinal contours. Coronary stenting. There is no edema, consolidation, effusion, or pneumothorax. Limited by degree of artifact from EKG leads. There is some undulation of bilateral ribs that is not definite for fracture when accounting for overlap on prior IMPRESSION: No  evidence of acute cardiopulmonary disease. Electronically Signed   By: Monte Fantasia M.D.   On: 08/23/2019 09:44   DG Hand Complete Left  Result Date: 08/23/2019 CLINICAL DATA:  Fall down stairs EXAM: LEFT HAND - COMPLETE 3+ VIEW COMPARISON:  None. FINDINGS: Alignment is anatomic. No acute fracture. There are degenerative changes at the fifth distal interphalangeal joint. IMPRESSION: No acute fracture or malalignment. Electronically Signed   By: Macy Mis M.D.   On: 08/23/2019 09:42   CT Maxillofacial Wo Contrast  Result Date: 08/23/2019 CLINICAL DATA:  Headache, found at bottom of stairs, confusion EXAM: CT HEAD WITHOUT CONTRAST CT MAXILLOFACIAL WITHOUT CONTRAST CT CERVICAL SPINE WITHOUT CONTRAST TECHNIQUE: Multidetector CT imaging of the head,  cervical spine, and maxillofacial structures were performed using the standard protocol without intravenous contrast. Multiplanar CT image reconstructions of the cervical spine and maxillofacial structures were also generated. COMPARISON:  07/26/2019 FINDINGS: CT HEAD FINDINGS Brain: No evidence of acute infarction, hemorrhage, hydrocephalus, extra-axial collection or mass lesion/mass effect. Periventricular and deep white matter hypodensity. Vascular: No hyperdense vessel or unexpected calcification. CT FACIAL BONES FINDINGS Skull: Normal. Negative for fracture or focal lesion. Facial bones: No displaced fractures or dislocations. Sinuses/Orbits: No acute finding. Other: Large soft tissue laceration and hematoma of the forehead and right cheek. CT CERVICAL SPINE FINDINGS Alignment: Normal. Skull base and vertebrae: No acute fracture. No primary bone lesion or focal pathologic process. Soft tissues and spinal canal: No prevertebral fluid or swelling. No visible canal hematoma. Disc levels: Moderate multilevel disc space height loss and osteophytosis. Upper chest: Negative. Other: None. IMPRESSION: 1. No acute intracranial pathology. Small-vessel white matter  disease. 2.  No displaced fracture or dislocation of the facial bones. 3. Large soft tissue laceration and hematoma of the forehead and right cheek. 4.  No fracture or static subluxation of the cervical spine. Electronically Signed   By: Lauralyn PrimesAlex  Bibbey M.D.   On: 08/23/2019 10:41    EKG: Independently reviewed. SR with prolonged QT interval  Assessment/Plan Active Problems:   HTN (hypertension)   Dementia (HCC)   Pressure injury of skin   Hypokalemia  (please populate well all problems here in Problem List. (For example, if patient is on BP meds at home and you resume or decide to hold them, it is a problem that needs to be her. Same for CAD, COPD, HLD and so on)   1. Hypokalemia - recurrent hypokalemia. EKG w/o acute changes. Plan IV hydration with 1/2 NS  Oral potassium replacement: 40 meq bid x 2 doses, then 20 meq bid  F/u lab  2. Trauma - lip was sutured. Periorbital laceration not sutured. Plan Routine wound care of facial bruising  Pain mgt: APAP, tramadol if needed  3. Dementia - long standing problem followed by Dr. Patrcia DollyKaren Aquino[facility]. 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 This has been progressive over the past six months and the patient is now at high risk for injury. She is on maximum medical therapy. Discussed in detail with her son emphasizing her need for 24 hr supervision. Plan PT/OT eval  TOC consult for SNF placement and long-term care in a memory unit.  4. HTN - continue home meds  5. Pressure sore sacrum - improved. Needs frequent turning  6. EoL care - began conversation with son, closest living relative. Discussed grim prognosis in the event of cardiac arrest. Discussed content of MOST form and have requested that he be provided a copy of this form. He wishes to discuss with his sister and Aunt before making any decisions. Recommend "[email or web address]" website.   DVT prophylaxis: lovenox  (Lovenox/Heparin/SCD's/anticoagulated/None (if comfort care) Code Status: full code (for now) (Full/Partial (specify details) Family Communication: discussed with son, answered all questions. He agrees with plan (Specify name, relationship. Do not write "discussed with patient". Specify tel # if discussed over the phone) Disposition Plan: TBD (specify when and where you expect patient to be discharged) Consults called: none (with names) Admission status: inpatient (inpatient / obs / tele / medical floor / SDU)   Illene RegulusMichael Autym Siess MD [facility] Pager [phone]  If 7PM-7AM, please contact night-coverage [email or web address] Password [email or web address]  08/23/2019, 3:06 PM

## 2019-08-24 LAB — RENAL FUNCTION PANEL
Albumin: 3 g/dL — ABNORMAL LOW (ref 3.5–5.0)
Anion gap: 10 (ref 5–15)
BUN: 8 mg/dL (ref 8–23)
CO2: 25 mmol/L (ref 22–32)
Calcium: 8.7 mg/dL — ABNORMAL LOW (ref 8.9–10.3)
Chloride: 107 mmol/L (ref 98–111)
Creatinine, Ser: 0.6 mg/dL (ref 0.44–1.00)
GFR calc Af Amer: >60 mL/min (ref >60)
GFR calc non Af Amer: >60 mL/min (ref >60)
Glucose, Bld: 128 mg/dL — ABNORMAL HIGH (ref 70–99)
Phosphorus: 2.1 mg/dL — ABNORMAL LOW (ref 2.5–4.6)
Potassium: 3.9 mmol/L (ref 3.5–5.1)
Sodium: 142 mmol/L (ref 135–145)

## 2019-08-24 LAB — CBC WITH DIFFERENTIAL/PLATELET
Abs Immature Granulocytes: 0.03 10*3/uL (ref 0.00–0.07)
Basophils Absolute: 0 10*3/uL (ref 0.0–0.1)
Basophils Relative: 1 %
Eosinophils Absolute: 0.2 10*3/uL (ref 0.0–0.5)
Eosinophils Relative: 3 %
HCT: 35.5 % — ABNORMAL LOW (ref 36.0–46.0)
Hemoglobin: 11.2 g/dL — ABNORMAL LOW (ref 12.0–15.0)
Immature Granulocytes: 1 %
Lymphocytes Relative: 30 %
Lymphs Abs: 1.8 10*3/uL (ref 0.7–4.0)
MCH: 27.4 pg (ref 26.0–34.0)
MCHC: 31.5 g/dL (ref 30.0–36.0)
MCV: 86.8 fL (ref 80.0–100.0)
Monocytes Absolute: 0.5 10*3/uL (ref 0.1–1.0)
Monocytes Relative: 8 %
Neutro Abs: 3.5 10*3/uL (ref 1.7–7.7)
Neutrophils Relative %: 57 %
Platelets: 223 10*3/uL (ref 150–400)
RBC: 4.09 MIL/uL (ref 3.87–5.11)
RDW: 14.3 % (ref 11.5–15.5)
WBC: 6.1 10*3/uL (ref 4.0–10.5)
nRBC: 0 % (ref 0.0–0.2)

## 2019-08-24 LAB — URINE CULTURE: Culture: <10000 — AB

## 2019-08-24 LAB — MAGNESIUM: Magnesium: 2 mg/dL (ref 1.7–2.4)

## 2019-08-24 MED ORDER — SODIUM PHOSPHATES 45 MMOLE/15ML IV SOLN
10.0000 mmol | Freq: Once | INTRAVENOUS | Status: AC
  Administered 2019-08-24: 10 mmol via INTRAVENOUS
  Filled 2019-08-24 (×2): qty 3.33

## 2019-08-24 NOTE — Evaluation (Addendum)
Occupational Therapy Evaluation Patient Details Name: Tiffany Mack MRN: [id number] DOB: 11/02/1940 Today's Date: 08/24/2019    History of Present Illness 79 y.o. female with medical history significant of CAD, Lewy body dementia, recent admission for covid 19 PNA and UTI discharged 07/26/19. By her son's report she has been having increased confusion,  hallucinations, and multiple falls. Dx of hypokalemia.   Clinical Impression   This 79 y/o female presents with the above. Pt with history of cognitive impairments and providing fluctuating responses regarding home setup and PLOF. Pt reports she is typically independent with mobility and ADL at home, does not recall reason for being in the hospital and does not recall falling. Pt pleasant and willing to mobilize with therapies. Pt requiring totalA for pericare as pt with episode of incontinence start of session, tolerating functional transfers and OOB to recliner with overall minA using RW. Pt requiring intermittent redirection to functional tasks. She will benefit from continued acute OT services, given cognitive impairments and history of falls at home feel she may benefit from SNF level therapies at time of discharge (unless family able to provide hands on 24hr supervision/assist) as pt remains at a higher risk for further falls. Will continue to follow.     Follow Up Recommendations  Supervision/Assistance - 24 hour;SNF    Equipment Recommendations  Tub/shower seat(to be further assessed)           Precautions / Restrictions Precautions Precautions: Fall Precaution Comments: pt admitted with a fall, h/o multiple falls per H&P, pt unreliable historian, R eye/face swollen and bruised from recent fall Restrictions Weight Bearing Restrictions: No      Mobility Bed Mobility Overal bed mobility: Needs Assistance Bed Mobility: Supine to Sit     Supine to sit: Min assist     General bed mobility comments: assist to raise  trunk, posterior lean initially requiring minA to correct  Transfers Overall transfer level: Needs assistance Equipment used: Rolling walker (2 wheeled) Transfers: Sit to/from [facility] Sit to Stand: Min guard Stand pivot transfers: Min assist       General transfer comment: VCs hand placement, min/guard to min A for balance, pt stood with RW for ~90 seconds for pericare    Balance Overall balance assessment: Needs assistance;History of Falls Sitting-balance support: Feet supported;Bilateral upper extremity supported Sitting balance-Leahy Scale: Poor Sitting balance - Comments: posterior lean initially, then pt able to maintain trunk in neutral after ~30 seconds Postural control: Posterior lean Standing balance support: Bilateral upper extremity supported Standing balance-Leahy Scale: Poor Standing balance comment: reliant on UE support, external assist                           ADL either performed or assessed with clinical judgement   ADL Overall ADL's : Needs assistance/impaired Eating/Feeding: Set up;Sitting   Grooming: Set up;Supervision/safety;Sitting   Upper Body Bathing: Min guard;Minimal assistance;Sitting   Lower Body Bathing: Moderate assistance;+2 for safety/equipment;Sit to/from stand   Upper Body Dressing : Minimal assistance;Sitting   Lower Body Dressing: Moderate assistance;+2 for safety/equipment;Sit to/from stand   Toilet Transfer: Minimal assistance;+2 for safety/equipment;Stand-pivot;RW Toilet Transfer Details (indicate cue type and reason): simulated via transfer to [place] and Hygiene: Maximal assistance;+2 for safety/equipment;Sit to/from stand Toileting - Clothing Manipulation Details (indicate cue type and reason): pt with incontinent BM upon arrival, provided assist for pericare in standing     Functional mobility during ADLs: Minimal assistance;Rolling walker General ADL Comments:  pt with impaired cognition, weakness                         Pertinent Vitals/Pain Pain Assessment: No/denies pain Faces Pain Scale: No hurt Pain Intervention(s): Monitored during session     Hand Dominance Right   Extremity/Trunk Assessment Upper Extremity Assessment Upper Extremity Assessment: Generalized weakness   Lower Extremity Assessment Lower Extremity Assessment: Defer to PT evaluation   Cervical / Trunk Assessment Cervical / Trunk Assessment: Normal   Communication Communication Communication: No difficulties   Cognition Arousal/Alertness: Awake/alert Behavior During Therapy: WFL for tasks assessed/performed Overall Cognitive Status: History of cognitive impairments - at baseline                                 General Comments: pt with hx of Lewey Body dementia per chart, unsure of accuracy of information provided when asking PLOF (pt initially stating her home is one level, but then when asking if she has steps in her home pt stating yes, per chart pt fell down a flight of stairs in her home), pt disoriented to time, situation, does not recall falling   General Comments       Exercises     Shoulder Instructions      Home Living Family/patient expects to be discharged to:: Private residence Living Arrangements: Children Available Help at Discharge: Available PRN/intermittently Type of Home: House       Home Layout: Two level;Bed/bath upstairs     Bathroom Shower/Tub: Walk-in shower         Home Equipment: Shower seat   Additional Comments: unsure of accuracy of home setup as pt is poor historian      Prior Functioning/Environment Level of Independence: Independent        Comments: pt reports she didn't need any DME for mobility, pt with hx of falls        OT Problem List: Decreased strength;Decreased range of motion;Decreased activity tolerance;Impaired balance (sitting and/or standing);Decreased cognition;Decreased  safety awareness;Decreased knowledge of use of DME or AE;Decreased knowledge of precautions      OT Treatment/Interventions: Self-care/ADL training;Therapeutic exercise;Energy conservation;DME and/or AE instruction;Therapeutic activities;Patient/family education;Balance training;Cognitive remediation/compensation    OT Goals(Current goals can be found in the care plan section) Acute Rehab OT Goals Patient Stated Goal: none stated, agreeable to getting up with therapies OT Goal Formulation: With patient Time For Goal Achievement: 09/07/19 Potential to Achieve Goals: Good  OT Frequency: Min 2X/week   Barriers to D/C:            Co-evaluation PT/OT/SLP Co-Evaluation/Treatment: Yes Reason for Co-Treatment: To address functional/ADL transfers;Necessary to address cognition/behavior during functional activity;For patient/therapist safety   OT goals addressed during session: ADL's and self-care      AM-PAC OT "6 Clicks" Daily Activity     Outcome Measure Help from another person eating meals?: A Little Help from another person taking care of personal grooming?: A Little Help from another person toileting, which includes using toliet, bedpan, or urinal?: A Lot Help from another person bathing (including washing, rinsing, drying)?: A Lot Help from another person to put on and taking off regular upper body clothing?: A Little Help from another person to put on and taking off regular lower body clothing?: A Lot 6 Click Score: 15   End of Session Equipment Utilized During Treatment: Rolling walker Nurse Communication: Mobility status  Activity Tolerance: Patient tolerated treatment  well Patient left: in chair;with call bell/phone within reach;with chair alarm set  OT Visit Diagnosis: Unsteadiness on feet (R26.81);History of falling (Z91.81);Other symptoms and signs involving cognitive function                Time: 7829-5621 OT Time Calculation (min): 17 min Charges:  OT General  Charges $OT Visit: 1 Visit OT Evaluation $OT Eval Moderate Complexity: 1 Mod   Tiffany Mack, OT [employer] Pager [phone] Office [phone]  Tiffany Mack 08/24/2019, 10:49 AM

## 2019-08-24 NOTE — Plan of Care (Signed)
  Problem: Safety: Goal: Ability to remain free from injury will improve Outcome: Progressing   

## 2019-08-24 NOTE — Evaluation (Signed)
Physical Therapy Evaluation Patient Details Name: Tiffany Mack MRN: [id number] DOB: [birth year] Today's Date: 08/24/2019   History of Present Illness  79 y.o. female with medical history significant of CAD, Lewy body dementia, recent admission for covid 19 PNA and UTI discharged 07/26/19. By her son's report she has been having increased confusion,  hallucinations, and multiple falls. Dx of hypokalemia.  Clinical Impression  Pt admitted with above diagnosis. Min assist for bed mobility and to pivot to recliner with RW. Pt incontinent of bowel upon start of PT session. Pt is unreliable historian, she is oriented to self and location but not to situation nor month/year. 24* assist recommended due to high fall risk and confusion.  Pt currently with functional limitations due to the deficits listed below (see PT Problem List). Pt will benefit from skilled PT to increase their independence and safety with mobility to allow discharge to the venue listed below.       Follow Up Recommendations SNF;Supervision/Assistance - 24 hour;Supervision for mobility/OOB    Equipment Recommendations  Rolling walker with 5" wheels    Recommendations for Other Services       Precautions / Restrictions Precautions Precautions: Fall(Simultaneous filing. User may not have seen previous data.) Precaution Comments: pt admitted with a fall, h/o multiple falls per H&P, pt unreliable historian(Simultaneous filing. User may not have seen previous data.) Restrictions Weight Bearing Restrictions: No(Simultaneous filing. User may not have seen previous data.)      Mobility  Bed Mobility Overal bed mobility: Needs Assistance Bed Mobility: Supine to Sit     Supine to sit: Min assist     General bed mobility comments: assist to raise trunk  Transfers Overall transfer level: Needs assistance Equipment used: Rolling walker (2 wheeled) Transfers: Sit to/from [employer] Sit to Stand: Min  guard Stand pivot transfers: Min assist       General transfer comment: VCs hand placement, min/guard to min A for balance, pt stood with RW for ~90 seconds for pericare  Ambulation/Gait             General Gait Details: deferred, fatigued with SPT  Stairs            Wheelchair Mobility    Modified Rankin (Stroke Patients Only)       Balance Overall balance assessment: Needs assistance Sitting-balance support: Feet supported;Bilateral upper extremity supported Sitting balance-Leahy Scale: Poor Sitting balance - Comments: posterior lean initially, then pt able to maintain trunk in neutral after ~30 seconds Postural control: Posterior lean Standing balance support: Bilateral upper extremity supported Standing balance-Leahy Scale: Poor                               Pertinent Vitals/Pain Pain Assessment: No/denies pain Faces Pain Scale: No hurt Pain Intervention(s): Monitored during session    Home Living Family/patient expects to be discharged to:: Private residence Living Arrangements: Children Available Help at Discharge: Available PRN/intermittently Type of Home: House       Home Layout: Two level;Bed/bath upstairs Home Equipment: Shower seat Additional Comments: unsure of accuracy of home setup as pt is poor historian    Prior Function Level of Independence: Independent         Comments: pt reports she didn't need any DME for mobility, pt with hx of falls     Hand Dominance   Dominant Hand: Right    Extremity/Trunk Assessment   Upper Extremity Assessment Upper Extremity Assessment: Defer  to OT evaluation    Lower Extremity Assessment Lower Extremity Assessment: Overall WFL for tasks assessed    Cervical / Trunk Assessment Cervical / Trunk Assessment: Normal  Communication   Communication: No difficulties  Cognition Arousal/Alertness: Awake/alert Behavior During Therapy: WFL for tasks assessed/performed Overall  Cognitive Status: No family/caregiver present to determine baseline cognitive functioning                                 General Comments: h/o dementia, pt oriented to self and location, not to situation nor month/year      General Comments      Exercises     Assessment/Plan    PT Assessment Patient needs continued PT services  PT Problem List Decreased mobility;Decreased activity tolerance;Decreased balance;Decreased knowledge of use of DME;Decreased cognition       PT Treatment Interventions Gait training;DME instruction;Therapeutic activities;Therapeutic exercise;Patient/family education;Functional mobility training;Balance training    PT Goals (Current goals can be found in the Care Plan section)  Acute Rehab PT Goals PT Goal Formulation: Patient unable to participate in goal setting Time For Goal Achievement: 09/07/19 Potential to Achieve Goals: Fair    Frequency Min 2X/week   Barriers to discharge        Co-evaluation   Reason for Co-Treatment: To address functional/ADL transfers;Necessary to address cognition/behavior during functional activity   OT goals addressed during session: ADL's and self-care       AM-PAC PT "6 Clicks" Mobility  Outcome Measure Help needed turning from your back to your side while in a flat bed without using bedrails?: A Little Help needed moving from lying on your back to sitting on the side of a flat bed without using bedrails?: A Little Help needed moving to and from a bed to a chair (including a wheelchair)?: A Little Help needed standing up from a chair using your arms (e.g., wheelchair or bedside chair)?: A Little Help needed to walk in hospital room?: A Lot Help needed climbing 3-5 steps with a railing? : A Lot 6 Click Score: 16    End of Session   Activity Tolerance: Patient tolerated treatment well Patient left: in chair;with call bell/phone within reach;with chair alarm set Nurse Communication: Mobility  status PT Visit Diagnosis: Unsteadiness on feet (R26.81);Repeated falls (R29.6);History of falling (Z91.81);Difficulty in walking, not elsewhere classified (R26.2)    Time: 3151-7616 PT Time Calculation (min) (ACUTE ONLY): 16 min   Charges:   PT Evaluation $PT Eval Low Complexity: 1 Low         Blondell Reveal Kistler PT 08/24/2019  Acute Rehabilitation Services Pager [phone] Office [phone]

## 2019-08-24 NOTE — Progress Notes (Signed)
Marland Kitchen  PROGRESS NOTE    Tiffany Mack  XNA:[id number] DOB: Apr 17, 1941 DOA: 08/23/2019 PCP: Jonathon Jordan, MD   Brief Narrative:   Tiffany Mack is a 79 y.o. female with medical history significant of CAD, dementia, recent admission for covid 19 PNA and UTI discharged 07/26/19. By her son's report she has been having increased confusion and hallucinations. She has had multiple falls and he has found her on the floor in disarray several times. Today he found her at the foot of the stairs after a fall. She was brought to [facility] ED for evaluation of trauma.  08/24/19: In good spirits today. Denies complaints. Spoke with son. He is onboard with SNF and possibly a memory care situation. K+ has been replaced. Replace phos. PT/OT rec SNF. TOC onboard. Labwise, looks fine.   Assessment & Plan:   Active Problems:   HTN (hypertension)   Dementia (HCC)   Pressure injury of skin   Hypokalemia  Fall Facial Trauma after fall     - see in ED; lip laceration sutured     - she is stable, does not report any HA, vision changes     - PT/OT recs SNf/24-hr supervision     - DME RW ordered     - TOC onboard  HTN     - metoprolol  Dementia     - namenda, exelon     - effexor-xr, remeron  HLD     - lipitor  Hypokalemia Hypophosphatemia     - replaced K+     - add phos  DVT prophylaxis: lovenox Code Status: FULL Family Communication: With son at bedside.   Disposition Plan: Likely to SNF  ROS:  Denies CP, N, V, dyspnea, HA, blurred vision . Remainder 10-pt ROS is negative for all not previously mentioned.  Subjective: "I didn't fall. Did I really fall?"  Objective: Vitals:   08/24/19 0452 08/24/19 0456 08/24/19 0459 08/24/19 0930  BP: 138/62 (!) 142/128 (!) 144/120 (!) 138/58  Pulse: 78 90 90 84  Resp: 18   14  Temp: 97.8 F (36.6 C)   98.4 F (36.9 C)  TempSrc: Oral   Oral  SpO2: 98% 97% 99% 97%  Weight:      Height:        Intake/Output Summary (Last 24 hours) at  08/24/2019 1315 Last data filed at 08/24/2019 0900 Gross per 24 hour  Intake 1693.63 ml  Output 700 ml  Net 993.63 ml   Filed Weights   08/23/19 0909  Weight: 72.6 kg    Examination:  General: 79 y.o. female resting in bed in NAD ENMT: bruising of both orbits, laceration of lip, nares patent w/o discharge, oropharynx ok Cardiovascular: RRR, +S1, S2, no m/g/r, equal pulses throughout Respiratory: CTABL, no w/r/r, normal WOB GI: BS+, NDNT, no masses noted, no organomegaly noted MSK: No e/c/c Neuro: Alert to name, follows commands Psyc: confused but calm/cooperative   Data Reviewed: I have personally reviewed following labs and imaging studies.  CBC: Recent Labs  Lab 08/23/19 0924 08/24/19 0759  WBC 10.2 6.1  NEUTROABS 8.7* 3.5  HGB 12.6 11.2*  HCT 39.5 35.5*  MCV 86.1 86.8  PLT 235 542   Basic Metabolic Panel: Recent Labs  Lab 08/23/19 0924 08/24/19 0759  NA 144 142  K 2.7* 3.9  CL 104 107  CO2 29 25  GLUCOSE 141* 128*  BUN 14 8  CREATININE 0.59 0.60  CALCIUM 8.7* 8.7*  MG  --  2.0  PHOS  --  2.1*   GFR: Estimated Creatinine Clearance: 60.6 mL/min (by C-G formula based on SCr of 0.6 mg/dL). Liver Function Tests: Recent Labs  Lab 08/23/19 0924 08/24/19 0759  AST 36  --   ALT 31  --   ALKPHOS 100  --   BILITOT 1.4*  --   PROT 6.5  --   ALBUMIN 3.2* 3.0*   No results for input(s): LIPASE, AMYLASE in the last 168 hours. No results for input(s): AMMONIA in the last 168 hours. Coagulation Profile: Recent Labs  Lab 08/23/19 0924  INR 1.1   Cardiac Enzymes: Recent Labs  Lab 08/23/19 0924  CKTOTAL 342*   BNP (last 3 results) No results for input(s): PROBNP in the last 8760 hours. HbA1C: Recent Labs    08/23/19 1720  HGBA1C 5.8*   CBG: No results for input(s): GLUCAP in the last 168 hours. Lipid Profile: No results for input(s): CHOL, HDL, LDLCALC, TRIG, CHOLHDL, LDLDIRECT in the last 72 hours. Thyroid Function Tests: No results for  input(s): TSH, T4TOTAL, FREET4, T3FREE, THYROIDAB in the last 72 hours. Anemia Panel: No results for input(s): VITAMINB12, FOLATE, FERRITIN, TIBC, IRON, RETICCTPCT in the last 72 hours. Sepsis Labs: No results for input(s): PROCALCITON, LATICACIDVEN in the last 168 hours.  Recent Results (from the past 240 hour(s))  Urine culture     Status: Abnormal   Collection Time: 08/23/19  9:24 AM   Specimen: Urine, Random  Result Value Ref Range Status   Specimen Description URINE, RANDOM  Final   Special Requests NONE  Final   Culture (A)  Final    <10,000 COLONIES/mL INSIGNIFICANT GROWTH Performed at [facility], [address], [place], [place] [address]    Report Status 08/24/2019 FINAL  Final      Radiology Studies: CT Head Wo Contrast  Result Date: 08/23/2019 CLINICAL DATA:  Headache, found at bottom of stairs, confusion EXAM: CT HEAD WITHOUT CONTRAST CT MAXILLOFACIAL WITHOUT CONTRAST CT CERVICAL SPINE WITHOUT CONTRAST TECHNIQUE: Multidetector CT imaging of the head, cervical spine, and maxillofacial structures were performed using the standard protocol without intravenous contrast. Multiplanar CT image reconstructions of the cervical spine and maxillofacial structures were also generated. COMPARISON:  07/26/2019 FINDINGS: CT HEAD FINDINGS Brain: No evidence of acute infarction, hemorrhage, hydrocephalus, extra-axial collection or mass lesion/mass effect. Periventricular and deep white matter hypodensity. Vascular: No hyperdense vessel or unexpected calcification. CT FACIAL BONES FINDINGS Skull: Normal. Negative for fracture or focal lesion. Facial bones: No displaced fractures or dislocations. Sinuses/Orbits: No acute finding. Other: Large soft tissue laceration and hematoma of the forehead and right cheek. CT CERVICAL SPINE FINDINGS Alignment: Normal. Skull base and vertebrae: No acute fracture. No primary bone lesion or focal pathologic process. Soft tissues and spinal canal: No  prevertebral fluid or swelling. No visible canal hematoma. Disc levels: Moderate multilevel disc space height loss and osteophytosis. Upper chest: Negative. Other: None. IMPRESSION: 1. No acute intracranial pathology. Small-vessel white matter disease. 2.  No displaced fracture or dislocation of the facial bones. 3. Large soft tissue laceration and hematoma of the forehead and right cheek. 4.  No fracture or static subluxation of the cervical spine. Electronically Signed   By: Lauralyn Primes M.D.   On: 08/23/2019 10:41   CT Cervical Spine Wo Contrast  Result Date: 08/23/2019 CLINICAL DATA:  Headache, found at bottom of stairs, confusion EXAM: CT HEAD WITHOUT CONTRAST CT MAXILLOFACIAL WITHOUT CONTRAST CT CERVICAL SPINE WITHOUT CONTRAST TECHNIQUE: Multidetector CT imaging of the head, cervical spine, and  maxillofacial structures were performed using the standard protocol without intravenous contrast. Multiplanar CT image reconstructions of the cervical spine and maxillofacial structures were also generated. COMPARISON:  07/26/2019 FINDINGS: CT HEAD FINDINGS Brain: No evidence of acute infarction, hemorrhage, hydrocephalus, extra-axial collection or mass lesion/mass effect. Periventricular and deep white matter hypodensity. Vascular: No hyperdense vessel or unexpected calcification. CT FACIAL BONES FINDINGS Skull: Normal. Negative for fracture or focal lesion. Facial bones: No displaced fractures or dislocations. Sinuses/Orbits: No acute finding. Other: Large soft tissue laceration and hematoma of the forehead and right cheek. CT CERVICAL SPINE FINDINGS Alignment: Normal. Skull base and vertebrae: No acute fracture. No primary bone lesion or focal pathologic process. Soft tissues and spinal canal: No prevertebral fluid or swelling. No visible canal hematoma. Disc levels: Moderate multilevel disc space height loss and osteophytosis. Upper chest: Negative. Other: None. IMPRESSION: 1. No acute intracranial pathology.  Small-vessel white matter disease. 2.  No displaced fracture or dislocation of the facial bones. 3. Large soft tissue laceration and hematoma of the forehead and right cheek. 4.  No fracture or static subluxation of the cervical spine. Electronically Signed   By: Lauralyn Primes M.D.   On: 08/23/2019 10:41   DG Pelvis Portable  Result Date: 08/23/2019 CLINICAL DATA:  Fall down multiple stairs EXAM: PORTABLE PELVIS 1-2 VIEWS COMPARISON:  None. FINDINGS: There is no evidence of pelvic fracture or diastasis. No pelvic bone lesions are seen. IMPRESSION: Negative. Electronically Signed   By: Duanne Guess D.O.   On: 08/23/2019 09:43   DG Chest Portable 1 View  Result Date: 08/23/2019 CLINICAL DATA:  Fall down stairs. Reportedly negative for chest pain. EXAM: PORTABLE CHEST 1 VIEW COMPARISON:  07/26/2019 FINDINGS: Normal heart size and mediastinal contours. Coronary stenting. There is no edema, consolidation, effusion, or pneumothorax. Limited by degree of artifact from EKG leads. There is some undulation of bilateral ribs that is not definite for fracture when accounting for overlap on prior IMPRESSION: No evidence of acute cardiopulmonary disease. Electronically Signed   By: Marnee Spring M.D.   On: 08/23/2019 09:44   DG Hand Complete Left  Result Date: 08/23/2019 CLINICAL DATA:  Fall down stairs EXAM: LEFT HAND - COMPLETE 3+ VIEW COMPARISON:  None. FINDINGS: Alignment is anatomic. No acute fracture. There are degenerative changes at the fifth distal interphalangeal joint. IMPRESSION: No acute fracture or malalignment. Electronically Signed   By: Guadlupe Spanish M.D.   On: 08/23/2019 09:42   CT Maxillofacial Wo Contrast  Result Date: 08/23/2019 CLINICAL DATA:  Headache, found at bottom of stairs, confusion EXAM: CT HEAD WITHOUT CONTRAST CT MAXILLOFACIAL WITHOUT CONTRAST CT CERVICAL SPINE WITHOUT CONTRAST TECHNIQUE: Multidetector CT imaging of the head, cervical spine, and maxillofacial structures were  performed using the standard protocol without intravenous contrast. Multiplanar CT image reconstructions of the cervical spine and maxillofacial structures were also generated. COMPARISON:  07/26/2019 FINDINGS: CT HEAD FINDINGS Brain: No evidence of acute infarction, hemorrhage, hydrocephalus, extra-axial collection or mass lesion/mass effect. Periventricular and deep white matter hypodensity. Vascular: No hyperdense vessel or unexpected calcification. CT FACIAL BONES FINDINGS Skull: Normal. Negative for fracture or focal lesion. Facial bones: No displaced fractures or dislocations. Sinuses/Orbits: No acute finding. Other: Large soft tissue laceration and hematoma of the forehead and right cheek. CT CERVICAL SPINE FINDINGS Alignment: Normal. Skull base and vertebrae: No acute fracture. No primary bone lesion or focal pathologic process. Soft tissues and spinal canal: No prevertebral fluid or swelling. No visible canal hematoma. Disc levels: Moderate multilevel disc space  height loss and osteophytosis. Upper chest: Negative. Other: None. IMPRESSION: 1. No acute intracranial pathology. Small-vessel white matter disease. 2.  No displaced fracture or dislocation of the facial bones. 3. Large soft tissue laceration and hematoma of the forehead and right cheek. 4.  No fracture or static subluxation of the cervical spine. Electronically Signed   By: Lauralyn Primes M.D.   On: 08/23/2019 10:41     Scheduled Meds: . aspirin  40.5 mg Oral BID  . atorvastatin  80 mg Oral q1800  . enoxaparin (LOVENOX) injection  40 mg Subcutaneous Q24H  . memantine  10 mg Oral Daily  . metoprolol tartrate  12.5 mg Oral BID  . mirtazapine  30 mg Oral QHS  . pantoprazole  40 mg Oral Q0600  . potassium chloride  20 mEq Oral BID  . rivastigmine  13.3 mg Transdermal Daily  . senna  1 tablet Oral BID  . sodium chloride flush  3 mL Intravenous Q12H  . venlafaxine XR  37.5 mg Oral Q breakfast   Continuous Infusions: . sodium chloride 50  mL/hr at 08/24/19 1308  . sodium chloride       LOS: 1 day    Time spent: 25 minutes spent in the coordination of care today.    Teddy Spike, DO [facility]  If 7PM-7AM, please contact night-coverage [email or web address] 08/24/2019, 1:15 PM

## 2019-08-25 DIAGNOSIS — G3183 Dementia with Lewy bodies: Secondary | ICD-10-CM

## 2019-08-25 DIAGNOSIS — Y92009 Unspecified place in unspecified non-institutional (private) residence as the place of occurrence of the external cause: Secondary | ICD-10-CM

## 2019-08-25 DIAGNOSIS — W19XXXA Unspecified fall, initial encounter: Secondary | ICD-10-CM

## 2019-08-25 LAB — RENAL FUNCTION PANEL
Albumin: 2.9 g/dL — ABNORMAL LOW (ref 3.5–5.0)
Anion gap: 9 (ref 5–15)
BUN: 7 mg/dL — ABNORMAL LOW (ref 8–23)
CO2: 27 mmol/L (ref 22–32)
Calcium: 8.6 mg/dL — ABNORMAL LOW (ref 8.9–10.3)
Chloride: 107 mmol/L (ref 98–111)
Creatinine, Ser: 0.54 mg/dL (ref 0.44–1.00)
GFR calc Af Amer: >60 mL/min (ref >60)
GFR calc non Af Amer: >60 mL/min (ref >60)
Glucose, Bld: 127 mg/dL — ABNORMAL HIGH (ref 70–99)
Phosphorus: 2.6 mg/dL (ref 2.5–4.6)
Potassium: 4 mmol/L (ref 3.5–5.1)
Sodium: 143 mmol/L (ref 135–145)

## 2019-08-25 LAB — CBC WITH DIFFERENTIAL/PLATELET
Abs Immature Granulocytes: 0.03 10*3/uL (ref 0.00–0.07)
Basophils Absolute: 0 10*3/uL (ref 0.0–0.1)
Basophils Relative: 1 %
Eosinophils Absolute: 0.3 10*3/uL (ref 0.0–0.5)
Eosinophils Relative: 5 %
HCT: 35.4 % — ABNORMAL LOW (ref 36.0–46.0)
Hemoglobin: 11.4 g/dL — ABNORMAL LOW (ref 12.0–15.0)
Immature Granulocytes: 1 %
Lymphocytes Relative: 23 %
Lymphs Abs: 1.5 10*3/uL (ref 0.7–4.0)
MCH: 27.9 pg (ref 26.0–34.0)
MCHC: 32.2 g/dL (ref 30.0–36.0)
MCV: 86.6 fL (ref 80.0–100.0)
Monocytes Absolute: 0.4 10*3/uL (ref 0.1–1.0)
Monocytes Relative: 6 %
Neutro Abs: 4.2 10*3/uL (ref 1.7–7.7)
Neutrophils Relative %: 64 %
Platelets: 213 10*3/uL (ref 150–400)
RBC: 4.09 MIL/uL (ref 3.87–5.11)
RDW: 14 % (ref 11.5–15.5)
WBC: 6.4 10*3/uL (ref 4.0–10.5)
nRBC: 0 % (ref 0.0–0.2)

## 2019-08-25 LAB — MAGNESIUM: Magnesium: 1.8 mg/dL (ref 1.7–2.4)

## 2019-08-25 NOTE — Plan of Care (Signed)
  Problem: Safety: Goal: Ability to remain free from injury will improve Outcome: Progressing   Problem: Skin Integrity: Goal: Risk for impaired skin integrity will decrease Outcome: Progressing   

## 2019-08-25 NOTE — NC FL2 (Signed)
[place] LEVEL OF CARE SCREENING TOOL     IDENTIFICATION  Patient Name: Tiffany Mack Birthdate: 1940-09-25 Sex: female Admission Date (Current Location): 08/23/2019  [facility] and [place] Number:  [employer] and Address:  [facility] [place], [place] [address] [place], [place] [address]      Provider Number: [id number]  Attending Physician Name and Address:  Dessa Phi, DO  Relative Name and Phone Number:  Alla German - son; [phone]    Current Level of Care: Hospital Recommended Level of Care: [place] Prior Approval Number:    Date Approved/Denied:   PASRR Number: (Submitted for [facility] 1/27. PASRR pending - MUST [id number])  Discharge Plan: SNF    Current Diagnoses: Patient Active Problem List   Diagnosis Date Noted  . Pneumonia due to COVID-19 virus 07/28/2019  . Acute cystitis without hematuria   . Acute encephalopathy 07/26/2019  . Pressure injury of skin 07/26/2019  . CAP (community acquired pneumonia) 07/26/2019  . Hypokalemia 07/26/2019  . Acute lower UTI 07/26/2019  . Depression 07/26/2019  . Malnutrition of moderate degree 03/24/2018  . 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 Vomiting 03/21/2018  . HTN (hypertension) 03/21/2018  . Dementia ([place]) 03/21/2018  . CAD (coronary artery disease) 03/21/2018  . Acute non Q wave MI (myocardial infarction), initial episode of care ([place]) 08/04/2015    Orientation RESPIRATION BLADDER Height & Weight     Self, Place(Oriented to place intermittently)  Normal Incontinent Weight: 160 lb (72.6 kg) Height:  5\' 9"  (175.3 cm)  BEHAVIORAL SYMPTOMS/MOOD NEUROLOGICAL BOWEL NUTRITION STATUS  Dangerous to self, others or property(Patient walks around the home, & prior to this admission fell down stairs in her sons home)   Incontinent Diet(Regular)  AMBULATORY STATUS COMMUNICATION OF NEEDS Skin   Total Care(Patient did not walk with PT on 1/26 during eval due  to being fatigued) Verbally Skin abrasions, Bruising, Other (Comment)(Stage 3 pressure injury to mid coccyx(present on adm); Suture of left posterior finer(present on adm); Abrasion and ecchymosis right face)                       Personal Care Assistance Level of Assistance  Bathing, Feeding, Dressing Bathing Assistance: Maximum assistance(Upper body min assist and lower body mod assist) Feeding assistance: Limited assistance(Needs assistance with set-up) Dressing Assistance: Maximum assistance(Upper body min assist and lower body mod assist)     Functional Limitations Info  Sight, Hearing, Speech Sight Info: Adequate Hearing Info: Adequate Speech Info: Adequate    SPECIAL CARE FACTORS FREQUENCY  PT (By licensed PT), OT (By licensed OT)     PT Frequency: PT evaluated at hospital 1/26. PT at [facility] Eval and Treat, a minimum of 5 days per week OT Frequency: OT evaluated at hospital 1/26. OT at SNF Eval and Treat, a minimum of 5 days per week            Contractures Contractures Info: Not present    Additional Factors Info  Code Status, Allergies Code Status Info: Full Allergies Info: NO known allergies           Current Medications (08/25/2019):  This is the current hospital active medication list Current Facility-Administered Medications  Medication Dose Route Frequency Provider Last Rate Last Admin  . 0.9 %  sodium chloride infusion  250 mL Intravenous PRN Norins, Heinz Knuckles, MD      . acetaminophen (TYLENOL) tablet 650 mg  650 mg Oral Q6H PRN Adella Hare  E, MD   650 mg at 08/24/19 2120   Or  . acetaminophen (TYLENOL) suppository 650 mg  650 mg Rectal Q6H PRN Norins, Rosalyn Gess, MD      . aspirin chewable tablet 40.5 mg  40.5 mg Oral BID Jacques Navy, MD   40.5 mg at 08/25/19 0849  . atorvastatin (LIPITOR) tablet 80 mg  80 mg Oral q1800 Jacques Navy, MD   80 mg at 08/24/19 1759  . enoxaparin (LOVENOX) injection 40 mg  40 mg Subcutaneous Q24H Norins,  Rosalyn Gess, MD   40 mg at 08/24/19 1759  . memantine (NAMENDA) tablet 10 mg  10 mg Oral Daily Norins, Rosalyn Gess, MD   10 mg at 08/25/19 0849  . metoprolol tartrate (LOPRESSOR) tablet 12.5 mg  12.5 mg Oral BID Jacques Navy, MD   12.5 mg at 08/25/19 0849  . mirtazapine (REMERON) tablet 30 mg  30 mg Oral QHS Norins, Rosalyn Gess, MD   30 mg at 08/24/19 2120  . nitroGLYCERIN (NITROSTAT) SL tablet 0.4 mg  0.4 mg Sublingual Q5 min PRN Norins, Rosalyn Gess, MD      . ondansetron Dunn) tablet 8 mg  8 mg Oral Q8H PRN Norins, Rosalyn Gess, MD      . pantoprazole (PROTONIX) EC tablet 40 mg  40 mg Oral Q0600 Jacques Navy, MD   40 mg at 08/25/19 0545  . rivastigmine (EXELON) 13.3 MG/24HR 13.3 mg  13.3 mg Transdermal Daily Norins, Rosalyn Gess, MD   13.3 mg at 08/25/19 0847  . senna (SENOKOT) tablet 8.6 mg  1 tablet Oral BID Jacques Navy, MD   8.6 mg at 08/25/19 0849  . sodium chloride flush (NS) 0.9 % injection 3 mL  3 mL Intravenous Q12H Norins, Rosalyn Gess, MD   3 mL at 08/23/19 1724  . sodium chloride flush (NS) 0.9 % injection 3 mL  3 mL Intravenous PRN Norins, Rosalyn Gess, MD      . venlafaxine XR (EFFEXOR-XR) 24 hr capsule 37.5 mg  37.5 mg Oral Q breakfast Norins, Rosalyn Gess, MD   37.5 mg at 08/25/19 0848     Discharge Medications: Please see discharge summary for a list of discharge medications.  Relevant Imaging Results:  Relevant Lab Results:   Additional Information ss#[id number]  Cristobal Goldmann, LCSW

## 2019-08-25 NOTE — TOC Progression Note (Signed)
Transition of Care [facility]) - Progression Note    Patient Details  Name: Tiffany Mack MRN: [id number] Date of Birth: [birth year]  Transition of [facility]) CM/SW Contact  Okey Dupre Lazaro Arms, LCSW Phone Number: 08/25/2019, 1:31 PM  Clinical Narrative: Talked with son at hospital (not in patient's room) regarding recommendation for ST rehab. When asked, Mr. Laural Benes indicated that his mother has never been to a facility for rehab. Son reported that his mother lives with him and he is her only child since the death of his sister a few years ago.  Son added that his mother has 2 living sisters. Mr. Laural Benes reported that his mother has been living with him for the last 3 years and prior to that she lived alone. Mr. Laural Benes indicated that due to her memory issues and he has someone with her when he and his wife are not at home. Son noted that in the past week he has found her down and one time she was naked when he found her. Prior to this hospital admission per son, she fell down the steps to the basement and per son this happened because he forgot to lock the door to the basement.   Mr. 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 Son reported that she has hallucinated while in the hospital and told him what she was seeing on the ceiling today.  Mr. Laural Benes also reported that this past Sunday his mother was talking to dead people and seeing things. Son also reported that his mom does not get agitated, just confused.   CSW explained the facility search process, what ST rehab entails. Mr. Laural Benes in favor of ST rehab and when asked, wants her information sent to facilities in [place] and [place] counties. He added that he has heard good things about [facility]. Son provided with Medicare.gov facility list for [place] and [place] counties. Mr. Laural Benes also talked with CSW regarding his thoughts about his mom going to  a memory care facility vs.a nursing home for long-term care after rehab (and finding a facility) and this was discussed.  At 1:03 pm CSW received a voicemail from son regarding him talking to a person at [facility] and they informed him that they could take his mom. CSW contacted son (1:10 pm) and talked with him about Memory Care. Mr. Laural Benes informed that if his mom went to Memory Care and received rehab there it would be out-of-pocket, versus going to a SNF and her insurance paying for it and son expressed and thanked CSW for the information. CSW also contacted [employer] and spoke with Georges Mouse (the person son talked with) regarding patient and he expressed agreement that the patient should go to a skilled facility for rehab first as rehab at his facility would be out-of-pocket. Mr. Mayford Knife informed CSW that the facility nurse Vevelyn Pat will be contacting CSW.      Expected Discharge Plan: Skilled Nursing Facility Barriers to Discharge: Awaiting State Approval (PASRR), Insurance Authorization  Expected Discharge Plan and Services Expected Discharge Plan: Skilled Nursing Facility In-house Referral: Clinical Social Work     Living arrangements for the past 2 months: Single Family Home                                     Social Determinants of Health (SDOH) Interventions  No SDOH interventions needed at this time.  Readmission Risk Interventions No flowsheet data found.

## 2019-08-25 NOTE — Progress Notes (Addendum)
PROGRESS NOTE    Tiffany Mack  UEA:[id number] DOB: 1941/06/14 DOA: 08/23/2019 PCP: Mila Palmer, MD     Brief Narrative:  Tiffany Johnson-Carteris a 79 y.o.femalewith medical history significant ofCAD, dementia, recent admission for Covid-19 PNA and UTI discharged 07/26/19. By her son's report she has been having increased confusion and hallucinations. She has had multiple falls and he has found her on the floor in disarray several times. Today he found her at the foot of the stairs after a fall. She was brought to [facility] ED for evaluation of trauma. Currently awaiting SNF placement.   New events last 24 hours / Subjective: Patient is oriented to self, place and year this morning.  She does not recall events surrounding her fall.  Denies any new complaints today.  Assessment & Plan:   Active Problems:   HTN (hypertension)   Dementia (HCC)   Pressure injury of skin   Hypokalemia   Fall and subsequent facial trauma after fall -Lip laceration sutured in the emergency department -Pelvic x-ray negative -Hand x-ray negative for acute fracture or malalignment -CT head, maxillofacial, cervical spine without acute intracranial pathology, no displaced fracture or dislocation of facial bones, large soft tissue laceration and hematoma of the forehead and right cheek, no fracture or static subluxation of the cervical spine -SNF placement recommended by PT  HTN -Metoprolol  Dementia without behavioral disturbance -Continue namenda, exelon, effexor-xr, remeron  HLD -Continue lipitor   In agreement with assessment of the pressure ulcer as below:  Pressure Injury 07/26/19 Coccyx Mid Stage 3 -  Full thickness tissue loss. Subcutaneous fat may be visible but bone, tendon or muscle are NOT exposed. yellow center (Active)  07/26/19 1858  Location: Coccyx  Location Orientation: Mid  Staging: Stage 3 -  Full thickness tissue loss. Subcutaneous fat may be visible but bone, tendon or  muscle are NOT exposed.  Wound Description (Comments): yellow center  Present on Admission: Yes         DVT prophylaxis: Lovenox Code Status: Full code Family Communication: No family at bedside Disposition Plan: Patient from home, will need SNF placement for discharge. Medically ready for dc once placement found.    Consultants:   None  Procedures:   None  Antimicrobials:  Anti-infectives (From admission, onward)   None        Objective: Vitals:   08/24/19 1648 08/24/19 2106 08/25/19 0423 08/25/19 0909  BP: (!) 143/65 139/70 (!) 145/98 (!) 141/59  Pulse: 77 78 80 79  Resp: 16 18 18 16   Temp: 97.9 F (36.6 C) 98 F (36.7 C) 98 F (36.7 C) 97.8 F (36.6 C)  TempSrc: Oral Oral Oral Oral  SpO2: 98% 98% 99% 98%  Weight:      Height:        Intake/Output Summary (Last 24 hours) at 08/25/2019 1059 Last data filed at 08/25/2019 1030 Gross per 24 hour  Intake 2355.01 ml  Output 950 ml  Net 1405.01 ml   Filed Weights   08/23/19 0909  Weight: 72.6 kg    Examination:  General exam: Appears calm and comfortable  Respiratory system: Clear to auscultation. Respiratory effort normal. No respiratory distress. No conversational dyspnea.  Cardiovascular system: S1 & S2 heard, RRR. No murmurs. No pedal edema. Gastrointestinal system: Abdomen is nondistended, soft and nontender. Normal bowel sounds heard. Central nervous system: Alert and oriented. No focal neurological deficits. Extremities: Symmetric in appearance  Skin: Significant bruising on her face Psychiatry: Stable, history of dementia  Data Reviewed:  I have personally reviewed following labs and imaging studies  CBC: Recent Labs  Lab 08/23/19 0924 08/24/19 0759 08/25/19 0641  WBC 10.2 6.1 6.4  NEUTROABS 8.7* 3.5 4.2  HGB 12.6 11.2* 11.4*  HCT 39.5 35.5* 35.4*  MCV 86.1 86.8 86.6  PLT 235 223 585   Basic Metabolic Panel: Recent Labs  Lab 08/23/19 0924 08/24/19 0759 08/25/19 0641  NA 144 142  143  K 2.7* 3.9 4.0  CL 104 107 107  CO2 29 25 27   GLUCOSE 141* 128* 127*  BUN 14 8 7*  CREATININE 0.59 0.60 0.54  CALCIUM 8.7* 8.7* 8.6*  MG  --  2.0 1.8  PHOS  --  2.1* 2.6   GFR: Estimated Creatinine Clearance: 60.6 mL/min (by C-G formula based on SCr of 0.54 mg/dL). Liver Function Tests: Recent Labs  Lab 08/23/19 0924 08/24/19 0759 08/25/19 0641  AST 36  --   --   ALT 31  --   --   ALKPHOS 100  --   --   BILITOT 1.4*  --   --   PROT 6.5  --   --   ALBUMIN 3.2* 3.0* 2.9*   No results for input(s): LIPASE, AMYLASE in the last 168 hours. No results for input(s): AMMONIA in the last 168 hours. Coagulation Profile: Recent Labs  Lab 08/23/19 0924  INR 1.1   Cardiac Enzymes: Recent Labs  Lab 08/23/19 0924  CKTOTAL 342*   BNP (last 3 results) No results for input(s): PROBNP in the last 8760 hours. HbA1C: Recent Labs    08/23/19 1720  HGBA1C 5.8*   CBG: No results for input(s): GLUCAP in the last 168 hours. Lipid Profile: No results for input(s): CHOL, HDL, LDLCALC, TRIG, CHOLHDL, LDLDIRECT in the last 72 hours. Thyroid Function Tests: No results for input(s): TSH, T4TOTAL, FREET4, T3FREE, THYROIDAB in the last 72 hours. Anemia Panel: No results for input(s): VITAMINB12, FOLATE, FERRITIN, TIBC, IRON, RETICCTPCT in the last 72 hours. Sepsis Labs: No results for input(s): PROCALCITON, LATICACIDVEN in the last 168 hours.  Recent Results (from the past 240 hour(s))  Urine culture     Status: Abnormal   Collection Time: 08/23/19  9:24 AM   Specimen: Urine, Random  Result Value Ref Range Status   Specimen Description URINE, RANDOM  Final   Special Requests NONE  Final   Culture (A)  Final    <10,000 COLONIES/mL INSIGNIFICANT GROWTH Performed at [facility], [address] [place], [place] [address]    Report Status 08/24/2019 FINAL  Final      Radiology Studies: No results found.    Scheduled Meds: . aspirin  40.5 mg Oral BID  .  atorvastatin  80 mg Oral q1800  . enoxaparin (LOVENOX) injection  40 mg Subcutaneous Q24H  . memantine  10 mg Oral Daily  . metoprolol tartrate  12.5 mg Oral BID  . mirtazapine  30 mg Oral QHS  . pantoprazole  40 mg Oral Q0600  . potassium chloride  20 mEq Oral BID  . rivastigmine  13.3 mg Transdermal Daily  . senna  1 tablet Oral BID  . sodium chloride flush  3 mL Intravenous Q12H  . venlafaxine XR  37.5 mg Oral Q breakfast   Continuous Infusions: . sodium chloride 50 mL/hr at 08/24/19 1308  . sodium chloride       LOS: 2 days      Time spent: 25 minutes   Dessa Phi, DO [facility] 08/25/2019, 10:59  AM   Available via Epic secure chat 7am-7pm After these hours, please refer to coverage provider listed on [email or web address]

## 2019-08-26 ENCOUNTER — Inpatient Hospital Stay (HOSPITAL_COMMUNITY): Payer: Medicare HMO

## 2019-08-26 LAB — URINALYSIS, ROUTINE W REFLEX MICROSCOPIC
Bilirubin Urine: NEGATIVE
Glucose, UA: NEGATIVE mg/dL
Hgb urine dipstick: NEGATIVE
Ketones, ur: NEGATIVE mg/dL
Leukocytes,Ua: NEGATIVE
Nitrite: NEGATIVE
Protein, ur: NEGATIVE mg/dL
Specific Gravity, Urine: 1.014 (ref 1.005–1.030)
pH: 7 (ref 5.0–8.0)

## 2019-08-26 NOTE — Progress Notes (Signed)
PROGRESS NOTE    Tiffany Mack  JEH:[id number] DOB: [birth year] DOA: 08/23/2019 PCP: Mila Palmer, MD     Brief Narrative:  Tiffany Johnson-Carteris a 79 y.o.femalewith medical history significant ofCAD, dementia, recent admission for Covid-19 PNA and UTI discharged 07/26/19. By her son's report she has been having increased confusion and hallucinations. She has had multiple falls and he has found her on the floor in disarray several times. Today he found her at the foot of the stairs after a fall. She was brought to [facility] ED for evaluation of trauma. Currently awaiting SNF placement.   New events last 24 hours / Subjective: States she is feeling well, no complaints of chest pain, SOB, cough, N/V/D, abdominal pain, dysuria. When I spoke with son, he reports that patient did complain of burning with urination yesterday to him.   Assessment & Plan:   Active Problems:   HTN (hypertension)   Dementia (HCC)   Pressure injury of skin   Hypokalemia   Fall and subsequent facial trauma after fall -Lip laceration sutured in the emergency department -Pelvic x-ray negative -Hand x-ray negative for acute fracture or malalignment -CT head, maxillofacial, cervical spine without acute intracranial pathology, no displaced fracture or dislocation of facial bones, large soft tissue laceration and hematoma of the forehead and right cheek, no fracture or static subluxation of the cervical spine -SNF placement recommended by PT  Fever -CXR reviewed independently, no signs of any consolidation noted -Blood cultures obtained -Check UA, urine culture to rule out UTI -Continue to monitor fever curve  HTN -Continue metoprolol  Dementia without behavioral disturbance -Continue namenda, exelon, effexor-xr, remeron  HLD -Continue lipitor   In agreement with assessment of the pressure ulcer as below:  Pressure Injury 07/26/19 Coccyx Mid Stage 3 -  Full thickness tissue loss. Subcutaneous  fat may be visible but bone, tendon or muscle are NOT exposed. yellow center (Active)  07/26/19 1858  Location: Coccyx  Location Orientation: Mid  Staging: Stage 3 -  Full thickness tissue loss. Subcutaneous fat may be visible but bone, tendon or muscle are NOT exposed.  Wound Description (Comments): yellow center  Present on Admission: Yes         DVT prophylaxis: Lovenox Code Status: Full code Family Communication: No family at bedside; discussed with son over the phone Disposition Plan: Patient from home.  Keep inpatient 1 more night, continue to watch fever and blood culture and UA/urine culture off antibiotic.  SNF placement in process   Consultants:   None  Procedures:   None  Antimicrobials:  Anti-infectives (From admission, onward)   None       Objective: Vitals:   08/25/19 0909 08/25/19 2149 08/26/19 0634 08/26/19 0913  BP: (!) 141/59 (!) 158/79 (!) 146/50 (!) 112/52  Pulse: 79 91 92 81  Resp: 16 18 18 18   Temp: 97.8 F (36.6 C) 97.7 F (36.5 C) (!) 100.7 F (38.2 C) 97.8 F (36.6 C)  TempSrc: Oral  Oral Oral  SpO2: 98% 94% 97% 96%  Weight:  74.8 kg    Height:        Intake/Output Summary (Last 24 hours) at 08/26/2019 0956 Last data filed at 08/26/2019 0600 Gross per 24 hour  Intake 600 ml  Output 750 ml  Net -150 ml   Filed Weights   08/23/19 0909 08/25/19 2149  Weight: 72.6 kg 74.8 kg    Examination: General exam: Appears calm and comfortable  Respiratory system: Clear to auscultation. Respiratory effort normal. Cardiovascular system:  S1 & S2 heard, RRR. No pedal edema. Gastrointestinal system: Abdomen is nondistended, soft and nontender. Normal bowel sounds heard. Central nervous system: Alert and oriented. Non focal exam. Speech clear  Extremities: Symmetric in appearance bilaterally  Skin: No rashes, lesions or ulcers on exposed skin, bruising throughout her face Psychiatry: History of dementia  Data Reviewed: I have personally  reviewed following labs and imaging studies  CBC: Recent Labs  Lab 08/23/19 0924 08/24/19 0759 08/25/19 0641  WBC 10.2 6.1 6.4  NEUTROABS 8.7* 3.5 4.2  HGB 12.6 11.2* 11.4*  HCT 39.5 35.5* 35.4*  MCV 86.1 86.8 86.6  PLT 235 223 580   Basic Metabolic Panel: Recent Labs  Lab 08/23/19 0924 08/24/19 0759 08/25/19 0641  NA 144 142 143  K 2.7* 3.9 4.0  CL 104 107 107  CO2 29 25 27   GLUCOSE 141* 128* 127*  BUN 14 8 7*  CREATININE 0.59 0.60 0.54  CALCIUM 8.7* 8.7* 8.6*  MG  --  2.0 1.8  PHOS  --  2.1* 2.6   GFR: Estimated Creatinine Clearance: 60.6 mL/min (by C-G formula based on SCr of 0.54 mg/dL). Liver Function Tests: Recent Labs  Lab 08/23/19 0924 08/24/19 0759 08/25/19 0641  AST 36  --   --   ALT 31  --   --   ALKPHOS 100  --   --   BILITOT 1.4*  --   --   PROT 6.5  --   --   ALBUMIN 3.2* 3.0* 2.9*   No results for input(s): LIPASE, AMYLASE in the last 168 hours. No results for input(s): AMMONIA in the last 168 hours. Coagulation Profile: Recent Labs  Lab 08/23/19 0924  INR 1.1   Cardiac Enzymes: Recent Labs  Lab 08/23/19 0924  CKTOTAL 342*   BNP (last 3 results) No results for input(s): PROBNP in the last 8760 hours. HbA1C: Recent Labs    08/23/19 1720  HGBA1C 5.8*   CBG: No results for input(s): GLUCAP in the last 168 hours. Lipid Profile: No results for input(s): CHOL, HDL, LDLCALC, TRIG, CHOLHDL, LDLDIRECT in the last 72 hours. Thyroid Function Tests: No results for input(s): TSH, T4TOTAL, FREET4, T3FREE, THYROIDAB in the last 72 hours. Anemia Panel: No results for input(s): VITAMINB12, FOLATE, FERRITIN, TIBC, IRON, RETICCTPCT in the last 72 hours. Sepsis Labs: No results for input(s): PROCALCITON, LATICACIDVEN in the last 168 hours.  Recent Results (from the past 240 hour(s))  Urine culture     Status: Abnormal   Collection Time: 08/23/19  9:24 AM   Specimen: Urine, Random  Result Value Ref Range Status   Specimen Description  URINE, RANDOM  Final   Special Requests NONE  Final   Culture (A)  Final    <10,000 COLONIES/mL INSIGNIFICANT GROWTH Performed at [facility], [address], [place], [place] [address]    Report Status 08/24/2019 FINAL  Final      Radiology Studies: DG CHEST PORT 1 VIEW  Result Date: 08/26/2019 CLINICAL DATA:  Dementia, fever, history coronary artery disease, tested positive for COVID-19 on 07/26/2019 EXAM: PORTABLE CHEST 1 VIEW COMPARISON:  Portable exam 0816 hours compared to 08/23/2019 FINDINGS: Normal heart size, mediastinal contours, and pulmonary vascularity. Biapical scarring. Lungs otherwise clear. No infiltrate, pleural effusion or pneumothorax. Diffuse osseous demineralization with old fracture of the anterior RIGHT fourth rib. IMPRESSION: Biapical scarring. No acute abnormalities. Electronically Signed   By: Lavonia Dana M.D.   On: 08/26/2019 08:50      Scheduled  Meds: . aspirin  40.5 mg Oral BID  . atorvastatin  80 mg Oral q1800  . enoxaparin (LOVENOX) injection  40 mg Subcutaneous Q24H  . memantine  10 mg Oral Daily  . metoprolol tartrate  12.5 mg Oral BID  . mirtazapine  30 mg Oral QHS  . pantoprazole  40 mg Oral Q0600  . rivastigmine  13.3 mg Transdermal Daily  . senna  1 tablet Oral BID  . sodium chloride flush  3 mL Intravenous Q12H  . venlafaxine XR  37.5 mg Oral Q breakfast   Continuous Infusions: . sodium chloride       LOS: 3 days      Time spent: 25 minutes   Noralee Stain, DO [facility] 08/26/2019, 9:56 AM   Available via Epic secure chat 7am-7pm After these hours, please refer to coverage provider listed on [email or web address]

## 2019-08-26 NOTE — Progress Notes (Signed)
Physical Therapy Treatment Patient Details Name: Tiffany Mack MRN: [id number] DOB: 1941/05/14 Today's Date: 08/26/2019    History of Present Illness 79 y.o. female with medical history significant of CAD, Lewy body dementia, recent admission for covid 19 PNA and UTI discharged 07/26/19. By her son's report she has been having increased confusion,  hallucinations, and multiple falls. Dx of hypokalemia.    PT Comments    Patient received in bed sleeping upon arrival, wakes to me calling her name. Son present for session. Patient oriented to place, but not situation. She is pleasant and agrees to PT session. Requires min assist for supine to sit. Ambulated in room with RW and down hall, she was veering to left using walker, ambulated the rest of distance with single hand held assist. Ambulated 200'. Patient also used BSC during session. Supervision/min guard for toileting tasks. Patient will continue to benefit from skilled PT while here to improve safety, strength and functional mobility.      Follow Up Recommendations  SNF;Supervision for mobility/OOB;Supervision/Assistance - 24 hour     Equipment Recommendations  Rolling walker with 5" wheels    Recommendations for Other Services       Precautions / Restrictions Precautions Precautions: Fall Precaution Comments: pt admitted with a fall, h/o multiple falls per H&P, R eye/face swollen and bruised from recent fall Restrictions Weight Bearing Restrictions: No    Mobility  Bed Mobility Overal bed mobility: Needs Assistance Bed Mobility: Supine to Sit     Supine to sit: Min assist     General bed mobility comments: reaches for me to raise trunk to full sitting.  Transfers Overall transfer level: Needs assistance Equipment used: Rolling walker (2 wheeled) Transfers: Sit to/from Stand Sit to Stand: Min assist         General transfer comment: Cues for hand placement  Ambulation/Gait Ambulation/Gait assistance: Min  guard Gait Distance (Feet): 200 Feet Assistive device: Rolling walker (2 wheeled);1 person hand held assist Gait Pattern/deviations: Step-through pattern;Drifts right/left Gait velocity: decr   General Gait Details: patient ambulated first 75 feet with RW, veering off to left. Got rid of walker and ambulated the rest of way with 1 hand held assist.   Stairs             Wheelchair Mobility    Modified Rankin (Stroke Patients Only)       Balance Overall balance assessment: Needs assistance;History of Falls Sitting-balance support: Feet supported Sitting balance-Leahy Scale: Good     Standing balance support: Bilateral upper extremity supported;Single extremity supported Standing balance-Leahy Scale: Fair Standing balance comment: benefits from at least 1 UE support for safety                            Cognition Arousal/Alertness: Awake/alert Behavior During Therapy: WFL for tasks assessed/performed Overall Cognitive Status: Within Functional Limits for tasks assessed                                 General Comments: Patient aware she is in hospital, does not recall falling. Son, Trey Paula present for session.      Exercises      General Comments        Pertinent Vitals/Pain Pain Assessment: No/denies pain    Home Living                      Prior  Function            PT Goals (current goals can now be found in the care plan section) Acute Rehab PT Goals Patient Stated Goal: patient's son would like rehab PT Goal Formulation: With family Time For Goal Achievement: 09/07/19 Potential to Achieve Goals: Good Progress towards PT goals: Progressing toward goals    Frequency    Min 2X/week      PT Plan Current plan remains appropriate    Co-evaluation              AM-PAC PT "6 Clicks" Mobility   Outcome Measure  Help needed turning from your back to your side while in a flat bed without using bedrails?:  None Help needed moving from lying on your back to sitting on the side of a flat bed without using bedrails?: A Little Help needed moving to and from a bed to a chair (including a wheelchair)?: A Little Help needed standing up from a chair using your arms (e.g., wheelchair or bedside chair)?: A Little Help needed to walk in hospital room?: A Little Help needed climbing 3-5 steps with a railing? : A Lot 6 Click Score: 18    End of Session Equipment Utilized During Treatment: Gait belt Activity Tolerance: Patient tolerated treatment well Patient left: in chair;with call bell/phone within reach;with family/visitor present Nurse Communication: Mobility status PT Visit Diagnosis: Unsteadiness on feet (R26.81);Difficulty in walking, not elsewhere classified (R26.2);Repeated falls (R29.6);Muscle weakness (generalized) (M62.81)     Time: 1355-1420 PT Time Calculation (min) (ACUTE ONLY): 25 min  Charges:  $Gait Training: 8-22 mins $Therapeutic Activity: 8-22 mins                     Akshitha Culmer, PT, GCS 08/26/19,2:32 PM

## 2019-08-26 NOTE — TOC Progression Note (Addendum)
Transition of Care [facility]) - Progression Note    Patient Details  Name: Yaffa Seckman MRN: [id number] Date of Birth: 1940/09/12  [facility] CM/SW Contact  Okey Dupre Lazaro Arms, LCSW Phone Number: 08/26/2019, 3:11 PM  Clinical Narrative: Maree Krabbe informed that [facility] declined patient. He was agreeable to talking with Revonda Standard, admissions Interior and spatial designer at [facility]. Later informed by son that he does not his mother to discharge to [place] and requested other facilities be contacted. Facility search was extended. CSW will continue to follow and will advise son if any additional bed offers are received.   5:15 pm: Visited with patient and son at bedside and informed Mr. Laural Benes that [facility] made a bed offer. Mr. Laural Benes will look into this facility this evening and he and this CSW will talk in the morning and he will be advised if any other SNF's made a bed offer, and will provide CSW with his decision.      Expected Discharge Plan: Skilled Nursing Facility Barriers to Discharge: Awaiting State Approval (PASRR), Insurance Authorization  Expected Discharge Plan and Services Expected Discharge Plan: Skilled Nursing Facility In-house Referral: Clinical Social Work     Living arrangements for the past 2 months: Single Family Home                                     Social Determinants of Health (SDOH) Interventions    Readmission Risk Interventions No flowsheet data found.

## 2019-08-26 NOTE — Plan of Care (Signed)
  Problem: Health Behavior/Discharge Planning: Goal: Ability to manage health-related needs will improve Outcome: Progressing   Problem: Nutrition: Goal: Adequate nutrition will be maintained Outcome: Progressing   

## 2019-08-27 DIAGNOSIS — I959 Hypotension, unspecified: Secondary | ICD-10-CM | POA: Diagnosis not present

## 2019-08-27 DIAGNOSIS — R410 Disorientation, unspecified: Secondary | ICD-10-CM | POA: Diagnosis not present

## 2019-08-27 DIAGNOSIS — K219 Gastro-esophageal reflux disease without esophagitis: Secondary | ICD-10-CM | POA: Diagnosis not present

## 2019-08-27 DIAGNOSIS — G3183 Dementia with Lewy bodies: Secondary | ICD-10-CM | POA: Diagnosis not present

## 2019-08-27 DIAGNOSIS — R296 Repeated falls: Secondary | ICD-10-CM | POA: Diagnosis not present

## 2019-08-27 DIAGNOSIS — S0993XA Unspecified injury of face, initial encounter: Secondary | ICD-10-CM | POA: Diagnosis not present

## 2019-08-27 DIAGNOSIS — I469 Cardiac arrest, cause unspecified: Secondary | ICD-10-CM | POA: Diagnosis not present

## 2019-08-27 DIAGNOSIS — Y92009 Unspecified place in unspecified non-institutional (private) residence as the place of occurrence of the external cause: Secondary | ICD-10-CM | POA: Diagnosis not present

## 2019-08-27 DIAGNOSIS — L89159 Pressure ulcer of sacral region, unspecified stage: Secondary | ICD-10-CM | POA: Diagnosis not present

## 2019-08-27 DIAGNOSIS — R5381 Other malaise: Secondary | ICD-10-CM | POA: Diagnosis not present

## 2019-08-27 DIAGNOSIS — E876 Hypokalemia: Secondary | ICD-10-CM | POA: Diagnosis not present

## 2019-08-27 DIAGNOSIS — S01521S Laceration with foreign body of lip, sequela: Secondary | ICD-10-CM | POA: Diagnosis not present

## 2019-08-27 DIAGNOSIS — R69 Illness, unspecified: Secondary | ICD-10-CM | POA: Diagnosis not present

## 2019-08-27 DIAGNOSIS — M2550 Pain in unspecified joint: Secondary | ICD-10-CM | POA: Diagnosis not present

## 2019-08-27 DIAGNOSIS — L89150 Pressure ulcer of sacral region, unstageable: Secondary | ICD-10-CM | POA: Diagnosis not present

## 2019-08-27 DIAGNOSIS — L89153 Pressure ulcer of sacral region, stage 3: Secondary | ICD-10-CM | POA: Diagnosis not present

## 2019-08-27 DIAGNOSIS — I2510 Atherosclerotic heart disease of native coronary artery without angina pectoris: Secondary | ICD-10-CM | POA: Diagnosis not present

## 2019-08-27 DIAGNOSIS — W19XXXS Unspecified fall, sequela: Secondary | ICD-10-CM | POA: Diagnosis not present

## 2019-08-27 DIAGNOSIS — W19XXXA Unspecified fall, initial encounter: Secondary | ICD-10-CM | POA: Diagnosis not present

## 2019-08-27 DIAGNOSIS — I10 Essential (primary) hypertension: Secondary | ICD-10-CM | POA: Diagnosis not present

## 2019-08-27 DIAGNOSIS — E785 Hyperlipidemia, unspecified: Secondary | ICD-10-CM | POA: Diagnosis not present

## 2019-08-27 DIAGNOSIS — R443 Hallucinations, unspecified: Secondary | ICD-10-CM | POA: Diagnosis not present

## 2019-08-27 DIAGNOSIS — Z7401 Bed confinement status: Secondary | ICD-10-CM | POA: Diagnosis not present

## 2019-08-27 LAB — CBC
HCT: 38.4 % (ref 36.0–46.0)
Hemoglobin: 12 g/dL (ref 12.0–15.0)
MCH: 27.7 pg (ref 26.0–34.0)
MCHC: 31.3 g/dL (ref 30.0–36.0)
MCV: 88.7 fL (ref 80.0–100.0)
Platelets: 282 10*3/uL (ref 150–400)
RBC: 4.33 MIL/uL (ref 3.87–5.11)
RDW: 14.3 % (ref 11.5–15.5)
WBC: 7 10*3/uL (ref 4.0–10.5)
nRBC: 0 % (ref 0.0–0.2)

## 2019-08-27 LAB — URINE CULTURE: Culture: <10000 — AB

## 2019-08-27 NOTE — Consult Note (Signed)
   [facility] CM Inpatient Consult   08/27/2019  Tiffany Mack 05/25/1941 [id number]    Patient was reviewed for 30 day readmission with 2 hospitalizations and an ED visit in the past 6 months and has 23% high risk score for readmission; as well as to check for [facility]) care management needs as a benefit from her [facility] insurance plan.  Chart brief review reveals PT/ OT evaluation and transition of care SW note showing that patient is recommended for SNF (skilled nursing facility) for short-term rehab at discharge and son was in favor of it. Prior to admission, patient lives with son, and has no one, when he and his wife are not at home.  [facility] Care Management needs not identifiable at thispoint,for post hospital follow-up aspatient's care will be met at the skilled level of care.  Please refer to [facility] care management for appropriate follow-up if there are changes in disposition.   For questions, and referral, please call:  Javanna Patin A. Soraya Paquette, BSN, RN-BC [facility] Liaison Cell: [phone]

## 2019-08-27 NOTE — Plan of Care (Signed)
  Problem: Health Behavior/Discharge Planning: Goal: Ability to manage health-related needs will improve Outcome: Adequate for Discharge   Problem: Clinical Measurements: Goal: Ability to maintain clinical measurements within normal limits will improve Outcome: Adequate for Discharge Goal: Will remain free from infection Outcome: Adequate for Discharge Goal: Diagnostic test results will improve Outcome: Adequate for Discharge Goal: Respiratory complications will improve Outcome: Adequate for Discharge Goal: Cardiovascular complication will be avoided Outcome: Adequate for Discharge   Problem: Nutrition: Goal: Adequate nutrition will be maintained Outcome: Adequate for Discharge   

## 2019-08-27 NOTE — Progress Notes (Signed)
DISCHARGE NOTE SNF [facility] to be discharged Skilled nursing facility per MD order. Patient verbalized understanding.  Skin clean, dry and intact without evidence of skin break down, no evidence of skin tears noted. IV catheter discontinued intact. Site without signs and symptoms of complications. Dressing and pressure applied. Pt denies pain at the site currently. No complaints noted.  Patient free of lines, drains, and wounds.   Discharge packet assembled. An After Visit Summary (AVS) was printed and given to the EMS personnel. Patient escorted via stretcher and discharged to [facility] via ambulance. Report called to accepting facility; all questions and concerns addressed.   Leonia Reeves, RN

## 2019-08-27 NOTE — Progress Notes (Signed)
Addendum to Discharge Notes SNF Tiffany Mack was discharged to Skilled nursing facility with staff [facility] Drone receiving report. Informed staff that patient was + Covid December 28 and past the 21 days . Charge Nurse aware of patient being discharged to SNF  With no need for swabbing for covid test prior to discharge.

## 2019-08-27 NOTE — Plan of Care (Signed)
  Problem: Safety: Goal: Ability to remain free from injury will improve Outcome: Progressing   

## 2019-08-27 NOTE — Discharge Summary (Signed)
Physician Discharge Summary  Tiffany Mack ZOX:[id number] DOB: 04-Feb-1941 DOA: 08/23/2019  PCP: Mila PalmerWolters, Sharon, MD  Admit date: 08/23/2019 Discharge date: 08/27/2019  Admitted From: Home Disposition:  SNF   Recommendations for Outpatient Follow-up:  1. Follow up with PCP in 1 week 2. Follow up on final urine culture and blood culture results.   Discharge Condition: Stable CODE STATUS: Full  Diet recommendation: Regular diet   Brief/Interim Summary: Tiffany HomesGayle Johnson-Carteris a 79 y.o.femalewith medical history significant ofCAD, dementia, recent admission for Covid-19 PNA and UTI discharged 07/26/19. By her son's report she has been having increased confusion and hallucinations. She has had multiple falls and he has found her on the floor in disarray several times. Today he found her at the foot of the stairs after a fall. [facility]. She had a fever 1/28, work up negative for any infectious etiology and has remained fever free.   Discharge Diagnoses:  Active Problems:   HTN (hypertension)   Dementia (HCC)   Pressure injury of skin   Hypokalemia   Fall and subsequent facial trauma after fall -Lip laceration sutured in the emergency department -Pelvic x-ray negative -Hand x-ray negative for acute fracture or malalignment -CT head, maxillofacial, cervical spine without acute intracranial pathology, no displaced fracture or dislocation of facial bones, large soft tissue laceration and hematoma of the forehead and right cheek, no fracture or static subluxation of the cervical spine -SNF placement recommended by PT  Fever -CXR reviewed independently, no signs of any consolidation noted -Blood cultures negative to date -UA unremarkable, urine culture pending -No fevers documented last 24 hours  HTN -Continue metoprolol  Dementia without behavioral disturbance -Continue namenda, exelon, effexor-xr, remeron  HLD -Continue  lipitor   In agreement with assessment of the pressure ulcer as below:  Pressure Injury 07/26/19 Coccyx Mid Stage 3 -  Full thickness tissue loss. Subcutaneous fat may be visible but bone, tendon or muscle are NOT exposed. yellow center (Active)  07/26/19 1858  Location: Coccyx  Location Orientation: Mid  Staging: Stage 3 -  Full thickness tissue loss. Subcutaneous fat may be visible but bone, tendon or muscle are NOT exposed.  Wound Description (Comments): yellow center  Present on Admission: Yes       Discharge Instructions  Discharge Instructions    Diet general   Complete by: As directed    Increase activity slowly   Complete by: As directed      Allergies as of 08/27/2019   No Known Allergies     Medication List    STOP taking these medications   cefdinir 300 MG capsule Commonly known as: OMNICEF   dexamethasone 6 MG tablet Commonly known as: DECADRON   phosphorus [id number] MG tablet Commonly known as: K PHOS NEUTRAL     TAKE these medications   aspirin 81 MG EC tablet Take 1 tablet (81 mg total) by mouth daily. What changed:   how much to take  when to take this   atorvastatin 80 MG tablet Commonly known as: LIPITOR Take 1 tablet (80 mg total) by mouth daily at 6 PM.   memantine 10 MG tablet Commonly known as: NAMENDA Take 1 tablet every night for 2 weeks, then increase to 1 tablet twice a day What changed:   how much to take  how to take this  when to take this  additional instructions   metoprolol tartrate 25 MG tablet Commonly known as: LOPRESSOR Take 0.5 tablets (12.5  mg total) by mouth 2 (two) times daily.   mirtazapine 30 MG tablet Commonly known as: REMERON Take 30 mg by mouth at bedtime.   nitroGLYCERIN 0.4 MG SL tablet Commonly known as: NITROSTAT Place 1 tablet (0.4 mg total) under the tongue every 5 (five) minutes as needed for chest pain (CP or SOB).   ondansetron 8 MG tablet Commonly known as: ZOFRAN Take 8 mg by  mouth every 8 (eight) hours as needed for nausea/vomiting.   pantoprazole 40 MG tablet Commonly known as: PROTONIX Take 1 tablet (40 mg total) by mouth daily at 6 (six) AM.   rivastigmine 13.3 MG/24HR Commonly known as: EXELON Place 13.3 mg onto the skin daily.   venlafaxine XR 37.5 MG 24 hr capsule Commonly known as: EFFEXOR-XR Take 37.5 mg by mouth daily with breakfast.            Durable Medical Equipment  (From admission, onward)         Start     Ordered   08/24/19 1316  For home use only DME Walker rolling  Once    Question Answer Comment  Walker: With 5 Inch Wheels   Patient needs a walker to treat with the following condition Fall      08/24/19 1315         Follow-up Information    Mila Palmer, MD. Schedule an appointment as soon as possible for a visit in 1 week(s).   Specialty: Family Medicine Contact information: [address] [place] [address] [phone]          No Known Allergies  Consultations:  None    Procedures/Studies: CT Head Wo Contrast  Result Date: 08/23/2019 CLINICAL DATA:  Headache, found at bottom of stairs, confusion EXAM: CT HEAD WITHOUT CONTRAST CT MAXILLOFACIAL WITHOUT CONTRAST CT CERVICAL SPINE WITHOUT CONTRAST TECHNIQUE: Multidetector CT imaging of the head, cervical spine, and maxillofacial structures were performed using the standard protocol without intravenous contrast. Multiplanar CT image reconstructions of the cervical spine and maxillofacial structures were also generated. COMPARISON:  07/26/2019 FINDINGS: CT HEAD FINDINGS Brain: No evidence of acute infarction, hemorrhage, hydrocephalus, extra-axial collection or mass lesion/mass effect. Periventricular and deep white matter hypodensity. Vascular: No hyperdense vessel or unexpected calcification. CT FACIAL BONES FINDINGS Skull: Normal. Negative for fracture or focal lesion. Facial bones: No displaced fractures or dislocations.  Sinuses/Orbits: No acute finding. Other: Large soft tissue laceration and hematoma of the forehead and right cheek. CT CERVICAL SPINE FINDINGS Alignment: Normal. Skull base and vertebrae: No acute fracture. No primary bone lesion or focal pathologic process. Soft tissues and spinal canal: No prevertebral fluid or swelling. No visible canal hematoma. Disc levels: Moderate multilevel disc space height loss and osteophytosis. Upper chest: Negative. Other: None. IMPRESSION: 1. No acute intracranial pathology. Small-vessel white matter disease. 2.  No displaced fracture or dislocation of the facial bones. 3. Large soft tissue laceration and hematoma of the forehead and right cheek. 4.  No fracture or static subluxation of the cervical spine. Electronically Signed   By: Lauralyn Primes M.D.   On: 08/23/2019 10:41   CT Cervical Spine Wo Contrast  Result Date: 08/23/2019 CLINICAL DATA:  Headache, found at bottom of stairs, confusion EXAM: CT HEAD WITHOUT CONTRAST CT MAXILLOFACIAL WITHOUT CONTRAST CT CERVICAL SPINE WITHOUT CONTRAST TECHNIQUE: Multidetector CT imaging of the head, cervical spine, and maxillofacial structures were performed using the standard protocol without intravenous contrast. Multiplanar CT image reconstructions of the cervical spine and maxillofacial structures were  also generated. COMPARISON:  07/26/2019 FINDINGS: CT HEAD FINDINGS Brain: No evidence of acute infarction, hemorrhage, hydrocephalus, extra-axial collection or mass lesion/mass effect. Periventricular and deep white matter hypodensity. Vascular: No hyperdense vessel or unexpected calcification. CT FACIAL BONES FINDINGS Skull: Normal. Negative for fracture or focal lesion. Facial bones: No displaced fractures or dislocations. Sinuses/Orbits: No acute finding. Other: Large soft tissue laceration and hematoma of the forehead and right cheek. CT CERVICAL SPINE FINDINGS Alignment: Normal. Skull base and vertebrae: No acute fracture. No primary  bone lesion or focal pathologic process. Soft tissues and spinal canal: No prevertebral fluid or swelling. No visible canal hematoma. Disc levels: Moderate multilevel disc space height loss and osteophytosis. Upper chest: Negative. Other: None. IMPRESSION: 1. No acute intracranial pathology. Small-vessel white matter disease. 2.  No displaced fracture or dislocation of the facial bones. 3. Large soft tissue laceration and hematoma of the forehead and right cheek. 4.  No fracture or static subluxation of the cervical spine. Electronically Signed   By: Lauralyn Primes M.D.   On: 08/23/2019 10:41   DG Pelvis Portable  Result Date: 08/23/2019 CLINICAL DATA:  Fall down multiple stairs EXAM: PORTABLE PELVIS 1-2 VIEWS COMPARISON:  None. FINDINGS: There is no evidence of pelvic fracture or diastasis. No pelvic bone lesions are seen. IMPRESSION: Negative. Electronically Signed   By: Duanne Guess D.O.   On: 08/23/2019 09:43   DG CHEST PORT 1 VIEW  Result Date: 08/26/2019 CLINICAL DATA:  Dementia, fever, history coronary artery disease, tested positive for COVID-19 on 07/26/2019 EXAM: PORTABLE CHEST 1 VIEW COMPARISON:  Portable exam 0816 hours compared to 08/23/2019 FINDINGS: Normal heart size, mediastinal contours, and pulmonary vascularity. Biapical scarring. Lungs otherwise clear. No infiltrate, pleural effusion or pneumothorax. Diffuse osseous demineralization with old fracture of the anterior RIGHT fourth rib. IMPRESSION: Biapical scarring. No acute abnormalities. Electronically Signed   By: Ulyses Southward M.D.   On: 08/26/2019 08:50   DG Chest Portable 1 View  Result Date: 08/23/2019 CLINICAL DATA:  Fall down stairs. Reportedly negative for chest pain. EXAM: PORTABLE CHEST 1 VIEW COMPARISON:  07/26/2019 FINDINGS: Normal heart size and mediastinal contours. Coronary stenting. There is no edema, consolidation, effusion, or pneumothorax. Limited by degree of artifact from EKG leads. There is some undulation of  bilateral ribs that is not definite for fracture when accounting for overlap on prior IMPRESSION: No evidence of acute cardiopulmonary disease. Electronically Signed   By: Marnee Spring M.D.   On: 08/23/2019 09:44   DG Hand Complete Left  Result Date: 08/23/2019 CLINICAL DATA:  Fall down stairs EXAM: LEFT HAND - COMPLETE 3+ VIEW COMPARISON:  None. FINDINGS: Alignment is anatomic. No acute fracture. There are degenerative changes at the fifth distal interphalangeal joint. IMPRESSION: No acute fracture or malalignment. Electronically Signed   By: Guadlupe Spanish M.D.   On: 08/23/2019 09:42   CT Maxillofacial Wo Contrast  Result Date: 08/23/2019 CLINICAL DATA:  Headache, found at bottom of stairs, confusion EXAM: CT HEAD WITHOUT CONTRAST CT MAXILLOFACIAL WITHOUT CONTRAST CT CERVICAL SPINE WITHOUT CONTRAST TECHNIQUE: Multidetector CT imaging of the head, cervical spine, and maxillofacial structures were performed using the standard protocol without intravenous contrast. Multiplanar CT image reconstructions of the cervical spine and maxillofacial structures were also generated. COMPARISON:  07/26/2019 FINDINGS: CT HEAD FINDINGS Brain: No evidence of acute infarction, hemorrhage, hydrocephalus, extra-axial collection or mass lesion/mass effect. Periventricular and deep white matter hypodensity. Vascular: No hyperdense vessel or unexpected calcification. CT FACIAL BONES FINDINGS Skull: Normal. Negative for  fracture or focal lesion. Facial bones: No displaced fractures or dislocations. Sinuses/Orbits: No acute finding. Other: Large soft tissue laceration and hematoma of the forehead and right cheek. CT CERVICAL SPINE FINDINGS Alignment: Normal. Skull base and vertebrae: No acute fracture. No primary bone lesion or focal pathologic process. Soft tissues and spinal canal: No prevertebral fluid or swelling. No visible canal hematoma. Disc levels: Moderate multilevel disc space height loss and osteophytosis. Upper  chest: Negative. Other: None. IMPRESSION: 1. No acute intracranial pathology. Small-vessel white matter disease. 2.  No displaced fracture or dislocation of the facial bones. 3. Large soft tissue laceration and hematoma of the forehead and right cheek. 4.  No fracture or static subluxation of the cervical spine. Electronically Signed   By: Eddie Candle M.D.   On: 08/23/2019 10:41       Discharge Exam: Vitals:   08/27/19 0646 08/27/19 0913  BP: 128/60 (!) 130/57  Pulse: 80 91  Resp: 18 18  Temp: 98.6 F (37 C) 98.2 F (36.8 C)  SpO2:  96%    General exam: Appears calm and comfortable  Respiratory system: Clear to auscultation. Respiratory effort normal. Cardiovascular system: S1 & S2 heard, RRR. No pedal edema. Gastrointestinal system: Abdomen is nondistended, soft and nontender. Normal bowel sounds heard. Central nervous system: Alert and oriented. Non focal exam. Speech clear  Extremities: Symmetric in appearance bilaterally  Skin: Bruising over facial bones Psychiatry: Stable, history of dementia   The results of significant diagnostics from this hospitalization (including imaging, microbiology, ancillary and laboratory) are listed below for reference.     Microbiology: Recent Results (from the past 240 hour(s))  Urine culture     Status: Abnormal   Collection Time: 08/23/19  9:24 AM   Specimen: Urine, Random  Result Value Ref Range Status   Specimen Description URINE, RANDOM  Final   Special Requests NONE  Final   Culture (A)  Final    <10,000 COLONIES/mL INSIGNIFICANT GROWTH Performed at [facility], [address], [place], [place] [address]    Report Status 08/24/2019 FINAL  Final  Culture, blood (routine x 2)     Status: None (Preliminary result)   Collection Time: 08/26/19  7:40 AM   Specimen: BLOOD  Result Value Ref Range Status   Specimen Description BLOOD RIGHT ANTECUBITAL  Final   Special Requests   Final    BOTTLES DRAWN AEROBIC ONLY Blood Culture  adequate volume   Culture   Final    NO GROWTH < 24 HOURS Performed at [facility], [place] [address] [place], [place] [address]    Report Status PENDING  Incomplete  Culture, blood (routine x 2)     Status: None (Preliminary result)   Collection Time: 08/26/19  7:41 AM   Specimen: BLOOD RIGHT HAND  Result Value Ref Range Status   Specimen Description BLOOD RIGHT HAND  Final   Special Requests   Final    BOTTLES DRAWN AEROBIC ONLY Blood Culture adequate volume   Culture   Final    NO GROWTH < 24 HOURS Performed at [facility], [place] [address] [place], [place] [address]    Report Status PENDING  Incomplete     Labs: BNP (last 3 results) No results for input(s): BNP in the last 8760 hours. Basic Metabolic Panel: Recent Labs  Lab 08/23/19 0924 08/24/19 0759 08/25/19 0641  NA 144 142 143  K 2.7* 3.9 4.0  CL 104 107 107  CO2 29  25 27  GLUCOSE 141* 128* 127*  BUN 14 8 7*  CREATININE 0.59 0.60 0.54  CALCIUM 8.7* 8.7* 8.6*  MG  --  2.0 1.8  PHOS  --  2.1* 2.6   Liver Function Tests: Recent Labs  Lab 08/23/19 0924 08/24/19 0759 08/25/19 0641  AST 36  --   --   ALT 31  --   --   ALKPHOS 100  --   --   BILITOT 1.4*  --   --   PROT 6.5  --   --   ALBUMIN 3.2* 3.0* 2.9*   No results for input(s): LIPASE, AMYLASE in the last 168 hours. No results for input(s): AMMONIA in the last 168 hours. CBC: Recent Labs  Lab 08/23/19 0924 08/24/19 0759 08/25/19 0641 08/27/19 0754  WBC 10.2 6.1 6.4 7.0  NEUTROABS 8.7* 3.5 4.2  --   HGB 12.6 11.2* 11.4* 12.0  HCT 39.5 35.5* 35.4* 38.4  MCV 86.1 86.8 86.6 88.7  PLT 235 223 213 282   Cardiac Enzymes: Recent Labs  Lab 08/23/19 0924  CKTOTAL 342*   BNP: Invalid input(s): POCBNP CBG: No results for input(s): GLUCAP in the last 168 hours. D-Dimer No results for input(s): DDIMER in the last 72 hours. Hgb A1c No results for input(s): HGBA1C in the last 72 hours. Lipid Profile No results for input(s): CHOL,  HDL, LDLCALC, TRIG, CHOLHDL, LDLDIRECT in the last 72 hours. Thyroid function studies No results for input(s): TSH, T4TOTAL, T3FREE, THYROIDAB in the last 72 hours.  Invalid input(s): FREET3 Anemia work up No results for input(s): VITAMINB12, FOLATE, FERRITIN, TIBC, IRON, RETICCTPCT in the last 72 hours. Urinalysis    Component Value Date/Time   COLORURINE YELLOW 08/26/2019 1800   APPEARANCEUR CLEAR 08/26/2019 1800   LABSPEC 1.014 08/26/2019 1800   PHURINE 7.0 08/26/2019 1800   GLUCOSEU NEGATIVE 08/26/2019 1800   HGBUR NEGATIVE 08/26/2019 1800   BILIRUBINUR NEGATIVE 08/26/2019 1800   KETONESUR NEGATIVE 08/26/2019 1800   PROTEINUR NEGATIVE 08/26/2019 1800   NITRITE NEGATIVE 08/26/2019 1800   LEUKOCYTESUR NEGATIVE 08/26/2019 1800   Sepsis Labs Invalid input(s): PROCALCITONIN,  WBC,  LACTICIDVEN Microbiology Recent Results (from the past 240 hour(s))  Urine culture     Status: Abnormal   Collection Time: 08/23/19  9:24 AM   Specimen: Urine, Random  Result Value Ref Range Status   Specimen Description URINE, RANDOM  Final   Special Requests NONE  Final   Culture (A)  Final    <10,000 COLONIES/mL INSIGNIFICANT GROWTH Performed at [facility], [address] [place], [place] [address]    Report Status 08/24/2019 FINAL  Final  Culture, blood (routine x 2)     Status: None (Preliminary result)   Collection Time: 08/26/19  7:40 AM   Specimen: BLOOD  Result Value Ref Range Status   Specimen Description BLOOD RIGHT ANTECUBITAL  Final   Special Requests   Final    BOTTLES DRAWN AEROBIC ONLY Blood Culture adequate volume   Culture   Final    NO GROWTH < 24 HOURS Performed at [facility], [address], [place], [place] [address]    Report Status PENDING  Incomplete  Culture, blood (routine x 2)     Status: None (Preliminary result)   Collection Time: 08/26/19  7:41 AM   Specimen: BLOOD RIGHT HAND  Result Value Ref Range Status   Specimen Description BLOOD RIGHT  HAND  Final   Special Requests   Final  BOTTLES DRAWN AEROBIC ONLY Blood Culture adequate volume   Culture   Final    NO GROWTH < 24 HOURS Performed at [facility], [address] [place], [place] [id number]    Report Status PENDING  Incomplete     Patient was seen and examined on the day of discharge and was found to be in stable condition. Time coordinating discharge: 35 minutes including assessment and coordination of care, as well as examination of the patient.   SIGNED:  Noralee StainJennifer Starr Urias, DO [facility] 08/27/2019, 11:38 AM

## 2019-08-27 NOTE — Progress Notes (Signed)
Pt was COVID positive on 07/25/20 and according to infection prevention &hospital policy pt does not need to retest as any retest will be positive. Social work has talked to the facility and are in agreement with this information.   Leonia Reeves, RN, BSN.

## 2019-08-27 NOTE — Plan of Care (Signed)
  Problem: Safety: Goal: Ability to remain free from injury will improve Outcome: Progressing   Problem: Skin Integrity: Goal: Risk for impaired skin integrity will decrease Outcome: Progressing   

## 2019-08-27 NOTE — Progress Notes (Signed)
Occupational Therapy Treatment Patient Details Name: Tiffany Mack MRN: [id number] DOB: [birth year] Today's Date: 08/27/2019    History of present illness 79 y.o. female with medical history significant of CAD, Lewy body dementia, recent admission for covid 19 PNA and UTI discharged 07/26/19. By her son's report she has been having increased confusion,  hallucinations, and multiple falls. Dx of hypokalemia.   OT comments  Pt making steady progress towards OT goals this session. Session focus on functional mobility, seated grooming tasks, UB/LB dressing and toilet transfer/ hygiene. Overall, pt required min A for functional mobility with RW for balance. Pt with poor safety awareness secondary to cognitive deficits found to be out of chair trying to get to toilet as pt had urinated on floor. Assisted pt with stand pivot transfer to [facility] with MIN A and RW, slight LOB noted during transfer with pt able to self correct. Pt completed pericare with MIN A. Pt required supervision for UB ADLs and MIN A for LB ADLs. DC plan currently remains appropriate, will follow acutely per POC.    Follow Up Recommendations  Supervision/Assistance - 24 hour;SNF    Equipment Recommendations  Tub/shower seat;Other (comment)(to be further assessed)    Recommendations for Other Services      Precautions / Restrictions Precautions Precautions: Fall Precaution Comments: pt admitted with a fall, h/o multiple falls per H&P, R eye/face swollen and bruised from recent fall Restrictions Weight Bearing Restrictions: No       Mobility Bed Mobility Overal bed mobility: Needs Assistance Bed Mobility: Sit to Supine;Rolling;Sidelying to Sit Rolling: Min assist Sidelying to sit: Min guard;HOB elevated   Sit to supine: Min guard;HOB elevated   General bed mobility comments: cues for technique; light MIN A to roll to L side; but able to elevate trunk with use of bed rail and cues for technique. Pt returned self to  supine with gross min guard for safety with HOB elevated  Transfers Overall transfer level: Needs assistance Equipment used: Rolling walker (2 wheeled) Transfers: Sit to/from [employer] Sit to Stand: Min assist;From elevated surface Stand pivot transfers: Min assist       General transfer comment: Cues for hand placement; light MIN A to power up into standing more so from [facility]    Balance Overall balance assessment: Needs assistance;History of Falls Sitting-balance support: Feet supported Sitting balance-Leahy Scale: Good     Standing balance support: Bilateral upper extremity supported;Single extremity supported Standing balance-Leahy Scale: Poor Standing balance comment: benefits from at least 1 UE support for safety                           ADL either performed or assessed with clinical judgement   ADL Overall ADL's : Needs assistance/impaired     Grooming: Wash/dry hands;Wash/dry face;Oral care;Sitting;Supervision/safety;Set up;Cueing for safety Grooming Details (indicate cue type and reason): pt required cueing to initiate new task as pt perseverates on task once started. cues for safety when washing face as pt has brusing on R eye. cues to not irritate bruise         Upper Body Dressing : Minimal assistance;Sitting Upper Body Dressing Details (indicate cue type and reason): hospital gown Lower Body Dressing: Minimal assistance;Sit to/from stand Lower Body Dressing Details (indicate cue type and reason): MIN A to pull underwear to waist line in standing and MIN A for balance Toilet Transfer: Minimal assistance;Stand-pivot;RW;BSC Toilet Transfer Details (indicate cue type and reason): MIN A for safety stand  pivot to [facility] with RW Toileting- Clothing Manipulation and Hygiene: Sit to/from stand;Minimal assistance Toileting - Clothing Manipulation Details (indicate cue type and reason): pt able to complete anterior pericare needing MIN A to manage  gown     Functional mobility during ADLs: Minimal assistance;Rolling walker;Cueing for safety General ADL Comments: pt with impaired cognition, weakness. session focus on seated grooming tasks, toilet transfer/ hygiene and UB/LB dressing. Pt with poor safety awareness noted to be up in room trying to get to toilet upon OT arrival     Vision       Perception     Praxis      Cognition Arousal/Alertness: Awake/alert   Overall Cognitive Status: History of cognitive impairments - at baseline                                 General Comments: LB dementia at baseline able to state she was in the hospital but did not know she had fallen and had not seen her face. Pt unable to state date or year but pleasant throughout session stating her son lives with her        Exercises     Shoulder Instructions       General Comments pt noted to be up in room upon OT arrival trying to get to [facility]; assisted pt safely to [facility] with RN aware    Pertinent Vitals/ Pain       Pain Assessment: No/denies pain  Home Living                                          Prior Functioning/Environment              Frequency  Min 2X/week        Progress Toward Goals  OT Goals(current goals can now be found in the care plan section)  Progress towards OT goals: Progressing toward goals  Acute Rehab OT Goals Patient Stated Goal: patient's son would like rehab OT Goal Formulation: With patient Time For Goal Achievement: 09/07/19 Potential to Achieve Goals: Good  Plan Discharge plan remains appropriate    Co-evaluation                 AM-PAC OT "6 Clicks" Daily Activity     Outcome Measure   Help from another person eating meals?: A Little Help from another person taking care of personal grooming?: A Little Help from another person toileting, which includes using toliet, bedpan, or urinal?: A Lot Help from another person bathing (including washing,  rinsing, drying)?: A Lot Help from another person to put on and taking off regular upper body clothing?: A Little Help from another person to put on and taking off regular lower body clothing?: A Lot 6 Click Score: 15    End of Session Equipment Utilized During Treatment: Rolling walker  OT Visit Diagnosis: Unsteadiness on feet (R26.81);History of falling (Z91.81);Other symptoms and signs involving cognitive function   Activity Tolerance Patient tolerated treatment well   Patient Left in bed;with call bell/phone within reach;with bed alarm set   Nurse Communication Mobility status;Other (comment)(needs new purewick)        Time: 8264-1583 OT Time Calculation (min): 32 min  Charges: OT General Charges $OT Visit: 1 Visit OT Treatments $Self Care/Home Management : 23-37 mins  Pollyann Glen  C., [place] [facility] [place] 08/27/2019, 11:14 AM

## 2019-08-27 NOTE — Progress Notes (Signed)
PROGRESS NOTE    Tiffany Mack  EVO:[id number] DOB: Feb 11, 1941 DOA: 08/23/2019 PCP: Mila Palmer, MD     Brief Narrative:  Tiffany Mack-Carteris a 79 y.o.femalewith medical history significant ofCAD, dementia, recent admission for Covid-19 PNA and UTI discharged 07/26/19. By her son's report she has been having increased confusion and hallucinations. She has had multiple falls and he has found her on the floor in disarray several times. Today he found her at the foot of the stairs after a fall. She was brought to [facility] ED for evaluation of trauma. Currently awaiting SNF placement.   New events last 24 hours / Subjective: Sitting in bed, no physical complaints, eating breakfast.  Denies any chest pain, shortness of breath or cough.  No nausea, vomiting, diarrhea or dysuria.  Assessment & Plan:   Active Problems:   HTN (hypertension)   Dementia (HCC)   Pressure injury of skin   Hypokalemia   Fall and subsequent facial trauma after fall -Lip laceration sutured in the emergency department -Pelvic x-ray negative -Hand x-ray negative for acute fracture or malalignment -CT head, maxillofacial, cervical spine without acute intracranial pathology, no displaced fracture or dislocation of facial bones, large soft tissue laceration and hematoma of the forehead and right cheek, no fracture or static subluxation of the cervical spine -SNF placement recommended by PT  Fever -CXR reviewed independently, no signs of any consolidation noted -Blood cultures negative to date -UA unremarkable, urine culture pending -No fevers documented last 24 hours  HTN -Continue metoprolol  Dementia without behavioral disturbance -Continue namenda, exelon, effexor-xr, remeron  HLD -Continue lipitor   In agreement with assessment of the pressure ulcer as below:  Pressure Injury 07/26/19 Coccyx Mid Stage 3 -  Full thickness tissue loss. Subcutaneous fat may be visible but bone, tendon or  muscle are NOT exposed. yellow center (Active)  07/26/19 1858  Location: Coccyx  Location Orientation: Mid  Staging: Stage 3 -  Full thickness tissue loss. Subcutaneous fat may be visible but bone, tendon or muscle are NOT exposed.  Wound Description (Comments): yellow center  Present on Admission: Yes         DVT prophylaxis: Lovenox Code Status: Full code Family Communication: No family at bedside Disposition Plan: Patient from home.  PT OT recommending SNF placement.  Medically stable for discharge once SNF placement and insurance auth completed.   Consultants:   None  Procedures:   None  Antimicrobials:  Anti-infectives (From admission, onward)   None       Objective: Vitals:   08/26/19 1722 08/26/19 2054 08/27/19 0646 08/27/19 0913  BP: 128/62 (!) 127/57 128/60 (!) 130/57  Pulse: 93 84 80 91  Resp: 18 18 18 18   Temp: 97.9 F (36.6 C) 98.5 F (36.9 C) 98.6 F (37 C) 98.2 F (36.8 C)  TempSrc: Oral  Oral Oral  SpO2: 98% 96%  96%  Weight:      Height:        Intake/Output Summary (Last 24 hours) at 08/27/2019 1054 Last data filed at 08/27/2019 0900 Gross per 24 hour  Intake 840 ml  Output 0 ml  Net 840 ml   Filed Weights   08/23/19 0909 08/25/19 2149  Weight: 72.6 kg 74.8 kg    Examination: General exam: Appears calm and comfortable  Respiratory system: Clear to auscultation. Respiratory effort normal. Cardiovascular system: S1 & S2 heard, RRR. No pedal edema. Gastrointestinal system: Abdomen is nondistended, soft and nontender. Normal bowel sounds heard. Central nervous system: Alert and  oriented. Non focal exam. Speech clear  Extremities: Symmetric in appearance bilaterally  Skin: Bruising over facial bones Psychiatry: Stable, history of dementia  Data Reviewed: I have personally reviewed following labs and imaging studies  CBC: Recent Labs  Lab 08/23/19 0924 08/24/19 0759 08/25/19 0641 08/27/19 0754  WBC 10.2 6.1 6.4 7.0  NEUTROABS  8.7* 3.5 4.2  --   HGB 12.6 11.2* 11.4* 12.0  HCT 39.5 35.5* 35.4* 38.4  MCV 86.1 86.8 86.6 88.7  PLT 235 223 213 062   Basic Metabolic Panel: Recent Labs  Lab 08/23/19 0924 08/24/19 0759 08/25/19 0641  NA 144 142 143  K 2.7* 3.9 4.0  CL 104 107 107  CO2 29 25 27   GLUCOSE 141* 128* 127*  BUN 14 8 7*  CREATININE 0.59 0.60 0.54  CALCIUM 8.7* 8.7* 8.6*  MG  --  2.0 1.8  PHOS  --  2.1* 2.6   GFR: Estimated Creatinine Clearance: 60.6 mL/min (by C-G formula based on SCr of 0.54 mg/dL). Liver Function Tests: Recent Labs  Lab 08/23/19 0924 08/24/19 0759 08/25/19 0641  AST 36  --   --   ALT 31  --   --   ALKPHOS 100  --   --   BILITOT 1.4*  --   --   PROT 6.5  --   --   ALBUMIN 3.2* 3.0* 2.9*   No results for input(s): LIPASE, AMYLASE in the last 168 hours. No results for input(s): AMMONIA in the last 168 hours. Coagulation Profile: Recent Labs  Lab 08/23/19 0924  INR 1.1   Cardiac Enzymes: Recent Labs  Lab 08/23/19 0924  CKTOTAL 342*   BNP (last 3 results) No results for input(s): PROBNP in the last 8760 hours. HbA1C: No results for input(s): HGBA1C in the last 72 hours. CBG: No results for input(s): GLUCAP in the last 168 hours. Lipid Profile: No results for input(s): CHOL, HDL, LDLCALC, TRIG, CHOLHDL, LDLDIRECT in the last 72 hours. Thyroid Function Tests: No results for input(s): TSH, T4TOTAL, FREET4, T3FREE, THYROIDAB in the last 72 hours. Anemia Panel: No results for input(s): VITAMINB12, FOLATE, FERRITIN, TIBC, IRON, RETICCTPCT in the last 72 hours. Sepsis Labs: No results for input(s): PROCALCITON, LATICACIDVEN in the last 168 hours.  Recent Results (from the past 240 hour(s))  Urine culture     Status: Abnormal   Collection Time: 08/23/19  9:24 AM   Specimen: Urine, Random  Result Value Ref Range Status   Specimen Description URINE, RANDOM  Final   Special Requests NONE  Final   Culture (A)  Final    <10,000 COLONIES/mL INSIGNIFICANT  GROWTH Performed at [facility], [address], [place], [place] [address]    Report Status 08/24/2019 FINAL  Final  Culture, blood (routine x 2)     Status: None (Preliminary result)   Collection Time: 08/26/19  7:40 AM   Specimen: BLOOD  Result Value Ref Range Status   Specimen Description BLOOD RIGHT ANTECUBITAL  Final   Special Requests   Final    BOTTLES DRAWN AEROBIC ONLY Blood Culture adequate volume   Culture   Final    NO GROWTH < 24 HOURS Performed at [facility], [place] [address] [place], [place] [address]    Report Status PENDING  Incomplete  Culture, blood (routine x 2)     Status: None (Preliminary result)   Collection Time: 08/26/19  7:41 AM   Specimen: BLOOD RIGHT HAND  Result Value Ref Range  Status   Specimen Description BLOOD RIGHT HAND  Final   Special Requests   Final    BOTTLES DRAWN AEROBIC ONLY Blood Culture adequate volume   Culture   Final    NO GROWTH < 24 HOURS Performed at [facility], [address], [place], [place] [address]    Report Status PENDING  Incomplete      Radiology Studies: DG CHEST PORT 1 VIEW  Result Date: 08/26/2019 CLINICAL DATA:  Dementia, fever, history coronary artery disease, tested positive for COVID-19 on 07/26/2019 EXAM: PORTABLE CHEST 1 VIEW COMPARISON:  Portable exam 0816 hours compared to 08/23/2019 FINDINGS: Normal heart size, mediastinal contours, and pulmonary vascularity. Biapical scarring. Lungs otherwise clear. No infiltrate, pleural effusion or pneumothorax. Diffuse osseous demineralization with old fracture of the anterior RIGHT fourth rib. IMPRESSION: Biapical scarring. No acute abnormalities. Electronically Signed   By: Ulyses Southward M.D.   On: 08/26/2019 08:50      Scheduled Meds: . aspirin  40.5 mg Oral BID  . atorvastatin  80 mg Oral q1800  . enoxaparin (LOVENOX) injection  40 mg Subcutaneous Q24H  . memantine  10 mg Oral Daily  . metoprolol tartrate  12.5 mg Oral BID  . mirtazapine   30 mg Oral QHS  . pantoprazole  40 mg Oral Q0600  . rivastigmine  13.3 mg Transdermal Daily  . senna  1 tablet Oral BID  . sodium chloride flush  3 mL Intravenous Q12H  . venlafaxine XR  37.5 mg Oral Q breakfast   Continuous Infusions: . sodium chloride       LOS: 4 days      Time spent: 25 minutes   Noralee Stain, DO [facility] 08/27/2019, 10:54 AM   Available via Epic secure chat 7am-7pm After these hours, please refer to coverage provider listed on [email or web address]

## 2019-08-27 NOTE — TOC Transition Note (Signed)
Transition of Care [facility]) - CM/SW Discharge Note *Discharged to [facility] via ambulance   Patient Details  Name: Tiffany Mack MRN: [id number] Date of Birth: 1940/09/15  Transition of Care [facility]) CM/SW Contact:  Cristobal Goldmann, LCSW Phone Number: 08/27/2019, 5:45 PM   Clinical Narrative: Son agreeable to [facility] [place] and per Thayer Ohm, admissions liaison with [facility], patient can discharge today. Patient's PASRR number is pending-Level II, and clinicals transmitted to [place] [facility]. Patient's insurance - [employer]ia Pouch currently has a Art therapist in place. Patient transported to facility by non-emergency ambulance. Son contacted regarding discharge and ambulance transport.      Final next level of care: Skilled Nursing Facility Barriers to Discharge: Barriers Resolved(Patient accepted with pending PASRR as clinicals transmitted to [place] [facility] on 08/27/19)   Patient Goals and CMS Choice Patient states their goals for this hospitalization and ongoing recovery are:: Son agreeable to ST rehab to assist his mother in getting stronger CMS Medicare.gov Compare Post Acute Care list provided to:: Patient Represenative (must comment) Choice offered to / list presented to : Adult Children  Discharge Placement PASRR number recieved: (PASRR number pending; requested cllinicals faxed 1/29 for Level II PASRR)            Patient chooses bed at: [facility] Patient to be transferred to facility by: ambulance Name of family member notified: Son Jule Ser - [phone] Patient and family notified of of transfer: 08/27/19  Discharge Plan and Services In-house Referral: Clinical Social Work                                   Social Determinants of Health (SDOH) Interventions  No SDOH interventions needed prior to discharge.   Readmission Risk Interventions No flowsheet data found.

## 2019-08-27 NOTE — Plan of Care (Signed)
  Problem: Health Behavior/Discharge Planning: Goal: Ability to manage health-related needs will improve 08/27/2019 1358 by Katrina Stack, RN Outcome: Adequate for Discharge 08/27/2019 1136 by Katrina Stack, RN Outcome: Adequate for Discharge   Problem: Clinical Measurements: Goal: Ability to maintain clinical measurements within normal limits will improve 08/27/2019 1358 by Katrina Stack, RN Outcome: Adequate for Discharge 08/27/2019 1136 by Katrina Stack, RN Outcome: Adequate for Discharge Goal: Will remain free from infection 08/27/2019 1358 by Katrina Stack, RN Outcome: Adequate for Discharge 08/27/2019 1136 by Katrina Stack, RN Outcome: Adequate for Discharge Goal: Diagnostic test results will improve 08/27/2019 1358 by Katrina Stack, RN Outcome: Adequate for Discharge 08/27/2019 1136 by Katrina Stack, RN Outcome: Adequate for Discharge Goal: Respiratory complications will improve 08/27/2019 1358 by Katrina Stack, RN Outcome: Adequate for Discharge 08/27/2019 1136 by Katrina Stack, RN Outcome: Adequate for Discharge Goal: Cardiovascular complication will be avoided 08/27/2019 1358 by Katrina Stack, RN Outcome: Adequate for Discharge 08/27/2019 1136 by Katrina Stack, RN Outcome: Adequate for Discharge

## 2019-08-30 DIAGNOSIS — R410 Disorientation, unspecified: Secondary | ICD-10-CM | POA: Diagnosis not present

## 2019-08-30 DIAGNOSIS — L89159 Pressure ulcer of sacral region, unspecified stage: Secondary | ICD-10-CM | POA: Diagnosis not present

## 2019-08-30 DIAGNOSIS — L89150 Pressure ulcer of sacral region, unstageable: Secondary | ICD-10-CM | POA: Diagnosis not present

## 2019-08-30 DIAGNOSIS — I10 Essential (primary) hypertension: Secondary | ICD-10-CM | POA: Diagnosis not present

## 2019-08-30 DIAGNOSIS — R5381 Other malaise: Secondary | ICD-10-CM | POA: Diagnosis not present

## 2019-08-30 DIAGNOSIS — W19XXXS Unspecified fall, sequela: Secondary | ICD-10-CM | POA: Diagnosis not present

## 2019-08-30 DIAGNOSIS — R443 Hallucinations, unspecified: Secondary | ICD-10-CM | POA: Diagnosis not present

## 2019-08-30 DIAGNOSIS — S01521S Laceration with foreign body of lip, sequela: Secondary | ICD-10-CM | POA: Diagnosis not present

## 2019-08-30 DIAGNOSIS — I2510 Atherosclerotic heart disease of native coronary artery without angina pectoris: Secondary | ICD-10-CM | POA: Diagnosis not present

## 2019-08-30 DIAGNOSIS — E785 Hyperlipidemia, unspecified: Secondary | ICD-10-CM | POA: Diagnosis not present

## 2019-08-30 DIAGNOSIS — L89153 Pressure ulcer of sacral region, stage 3: Secondary | ICD-10-CM | POA: Diagnosis not present

## 2019-08-30 DIAGNOSIS — K219 Gastro-esophageal reflux disease without esophagitis: Secondary | ICD-10-CM | POA: Diagnosis not present

## 2019-08-30 DIAGNOSIS — S0993XA Unspecified injury of face, initial encounter: Secondary | ICD-10-CM | POA: Diagnosis not present

## 2019-08-30 DIAGNOSIS — R69 Illness, unspecified: Secondary | ICD-10-CM | POA: Diagnosis not present

## 2019-08-30 DIAGNOSIS — R296 Repeated falls: Secondary | ICD-10-CM | POA: Diagnosis not present

## 2019-08-30 DIAGNOSIS — E876 Hypokalemia: Secondary | ICD-10-CM | POA: Diagnosis not present

## 2019-08-31 LAB — CULTURE, BLOOD (ROUTINE X 2)
Culture: NO GROWTH
Culture: NO GROWTH
Special Requests: ADEQUATE
Special Requests: ADEQUATE

## 2019-09-07 DIAGNOSIS — I10 Essential (primary) hypertension: Secondary | ICD-10-CM | POA: Diagnosis not present

## 2019-09-07 DIAGNOSIS — R5381 Other malaise: Secondary | ICD-10-CM | POA: Diagnosis not present

## 2019-09-07 DIAGNOSIS — L89159 Pressure ulcer of sacral region, unspecified stage: Secondary | ICD-10-CM | POA: Diagnosis not present

## 2019-09-07 DIAGNOSIS — E785 Hyperlipidemia, unspecified: Secondary | ICD-10-CM | POA: Diagnosis not present

## 2019-09-08 DIAGNOSIS — L89153 Pressure ulcer of sacral region, stage 3: Secondary | ICD-10-CM | POA: Diagnosis not present

## 2019-09-14 DIAGNOSIS — I10 Essential (primary) hypertension: Secondary | ICD-10-CM | POA: Diagnosis not present

## 2019-09-14 DIAGNOSIS — E785 Hyperlipidemia, unspecified: Secondary | ICD-10-CM | POA: Diagnosis not present

## 2019-09-14 DIAGNOSIS — L89159 Pressure ulcer of sacral region, unspecified stage: Secondary | ICD-10-CM | POA: Diagnosis not present

## 2019-09-14 DIAGNOSIS — R69 Illness, unspecified: Secondary | ICD-10-CM | POA: Diagnosis not present

## 2019-09-24 DIAGNOSIS — L89159 Pressure ulcer of sacral region, unspecified stage: Secondary | ICD-10-CM | POA: Diagnosis not present

## 2019-09-24 DIAGNOSIS — E785 Hyperlipidemia, unspecified: Secondary | ICD-10-CM | POA: Diagnosis not present

## 2019-09-24 DIAGNOSIS — R69 Illness, unspecified: Secondary | ICD-10-CM | POA: Diagnosis not present

## 2019-09-24 DIAGNOSIS — I10 Essential (primary) hypertension: Secondary | ICD-10-CM | POA: Diagnosis not present

## 2019-09-29 DIAGNOSIS — R69 Illness, unspecified: Secondary | ICD-10-CM | POA: Diagnosis not present

## 2019-09-29 DIAGNOSIS — K219 Gastro-esophageal reflux disease without esophagitis: Secondary | ICD-10-CM | POA: Diagnosis not present

## 2019-09-29 DIAGNOSIS — I10 Essential (primary) hypertension: Secondary | ICD-10-CM | POA: Diagnosis not present

## 2019-09-29 DIAGNOSIS — F339 Major depressive disorder, recurrent, unspecified: Secondary | ICD-10-CM | POA: Diagnosis not present

## 2019-09-29 DIAGNOSIS — E7849 Other hyperlipidemia: Secondary | ICD-10-CM | POA: Diagnosis not present

## 2019-09-29 DIAGNOSIS — R630 Anorexia: Secondary | ICD-10-CM | POA: Diagnosis not present

## 2019-09-29 DIAGNOSIS — F419 Anxiety disorder, unspecified: Secondary | ICD-10-CM | POA: Diagnosis not present

## 2019-11-18 DIAGNOSIS — Z1389 Encounter for screening for other disorder: Secondary | ICD-10-CM | POA: Diagnosis not present

## 2019-11-18 DIAGNOSIS — Z130 Encounter for screening for diseases of the blood and blood-forming organs and certain disorders involving the immune mechanism: Secondary | ICD-10-CM | POA: Diagnosis not present

## 2019-11-18 DIAGNOSIS — Z131 Encounter for screening for diabetes mellitus: Secondary | ICD-10-CM | POA: Diagnosis not present

## 2019-11-18 DIAGNOSIS — Z13220 Encounter for screening for lipoid disorders: Secondary | ICD-10-CM | POA: Diagnosis not present

## 2019-12-24 ENCOUNTER — Ambulatory Visit: Payer: Medicare HMO | Admitting: Neurology

## 2020-04-12 DIAGNOSIS — R69 Illness, unspecified: Secondary | ICD-10-CM | POA: Diagnosis not present

## 2020-04-12 DIAGNOSIS — F419 Anxiety disorder, unspecified: Secondary | ICD-10-CM | POA: Diagnosis not present

## 2020-04-24 ENCOUNTER — Emergency Department (HOSPITAL_COMMUNITY)
Admission: EM | Admit: 2020-04-24 | Discharge: 2020-04-24 | Disposition: A | Discharge status: Home / Self Care | Payer: Medicare HMO | Attending: Emergency Medicine | Admitting: Emergency Medicine

## 2020-04-24 ENCOUNTER — Other Ambulatory Visit: Payer: Self-pay

## 2020-04-24 ENCOUNTER — Encounter (HOSPITAL_COMMUNITY): Payer: Self-pay | Admitting: Obstetrics and Gynecology

## 2020-04-24 DIAGNOSIS — I2510 Atherosclerotic heart disease of native coronary artery without angina pectoris: Secondary | ICD-10-CM | POA: Diagnosis not present

## 2020-04-24 DIAGNOSIS — F0390 Unspecified dementia without behavioral disturbance: Secondary | ICD-10-CM | POA: Diagnosis not present

## 2020-04-24 DIAGNOSIS — Z8616 Personal history of COVID-19: Secondary | ICD-10-CM | POA: Diagnosis not present

## 2020-04-24 DIAGNOSIS — I10 Essential (primary) hypertension: Secondary | ICD-10-CM | POA: Diagnosis not present

## 2020-04-24 DIAGNOSIS — M5489 Other dorsalgia: Secondary | ICD-10-CM | POA: Diagnosis not present

## 2020-04-24 DIAGNOSIS — N3000 Acute cystitis without hematuria: Secondary | ICD-10-CM

## 2020-04-24 DIAGNOSIS — M545 Low back pain: Secondary | ICD-10-CM | POA: Diagnosis not present

## 2020-04-24 DIAGNOSIS — Z79899 Other long term (current) drug therapy: Secondary | ICD-10-CM | POA: Diagnosis not present

## 2020-04-24 DIAGNOSIS — R3981 Functional urinary incontinence: Secondary | ICD-10-CM | POA: Diagnosis not present

## 2020-04-24 DIAGNOSIS — R52 Pain, unspecified: Secondary | ICD-10-CM | POA: Diagnosis not present

## 2020-04-24 DIAGNOSIS — R404 Transient alteration of awareness: Secondary | ICD-10-CM | POA: Diagnosis not present

## 2020-04-24 DIAGNOSIS — R69 Illness, unspecified: Secondary | ICD-10-CM | POA: Diagnosis not present

## 2020-04-24 LAB — URINALYSIS, ROUTINE W REFLEX MICROSCOPIC
Bilirubin Urine: NEGATIVE
Glucose, UA: NEGATIVE mg/dL
Ketones, ur: 20 mg/dL — AB
Nitrite: NEGATIVE
Protein, ur: NEGATIVE mg/dL
Specific Gravity, Urine: 1.015 (ref 1.005–1.030)
WBC, UA: >50 WBC/hpf — ABNORMAL HIGH (ref 0–5)
pH: 6 (ref 5.0–8.0)

## 2020-04-24 LAB — COMPREHENSIVE METABOLIC PANEL
ALT: 26 U/L (ref 0–44)
AST: 41 U/L (ref 15–41)
Albumin: 4.2 g/dL (ref 3.5–5.0)
Alkaline Phosphatase: 116 U/L (ref 38–126)
Anion gap: 13 (ref 5–15)
BUN: 13 mg/dL (ref 8–23)
CO2: 24 mmol/L (ref 22–32)
Calcium: 9.2 mg/dL (ref 8.9–10.3)
Chloride: 103 mmol/L (ref 98–111)
Creatinine, Ser: 0.66 mg/dL (ref 0.44–1.00)
GFR calc Af Amer: >60 mL/min (ref >60)
GFR calc non Af Amer: >60 mL/min (ref >60)
Glucose, Bld: 125 mg/dL — ABNORMAL HIGH (ref 70–99)
Potassium: 4.6 mmol/L (ref 3.5–5.1)
Sodium: 140 mmol/L (ref 135–145)
Total Bilirubin: 1.3 mg/dL — ABNORMAL HIGH (ref 0.3–1.2)
Total Protein: 7.9 g/dL (ref 6.5–8.1)

## 2020-04-24 LAB — CBC
HCT: 42.4 % (ref 36.0–46.0)
Hemoglobin: 13.8 g/dL (ref 12.0–15.0)
MCH: 28.5 pg (ref 26.0–34.0)
MCHC: 32.5 g/dL (ref 30.0–36.0)
MCV: 87.6 fL (ref 80.0–100.0)
Platelets: 156 10*3/uL (ref 150–400)
RBC: 4.84 MIL/uL (ref 3.87–5.11)
RDW: 13.1 % (ref 11.5–15.5)
WBC: 15.3 10*3/uL — ABNORMAL HIGH (ref 4.0–10.5)
nRBC: 0 % (ref 0.0–0.2)

## 2020-04-24 MED ORDER — SODIUM CHLORIDE 0.9 % IV SOLN: 1.0000 g | Freq: Once | INTRAVENOUS | Status: DC

## 2020-04-24 MED ORDER — CEPHALEXIN 500 MG PO CAPS: 500.0000 mg | ORAL_CAPSULE | Freq: Three times a day (TID) | ORAL | 0 refills | Status: DC

## 2020-04-24 MED ORDER — CEPHALEXIN 500 MG PO CAPS: 500.0000 mg | ORAL_CAPSULE | Freq: Three times a day (TID) | ORAL | 0 refills | Status: AC

## 2020-04-24 MED ORDER — ACETAMINOPHEN 325 MG PO TABS
650.0000 mg | ORAL_TABLET | Freq: Once | ORAL | Status: AC
  Administered 2020-04-24: 650 mg via ORAL
  Filled 2020-04-24: qty 2

## 2020-04-24 MED ORDER — CEFTRIAXONE SODIUM 1 G IJ SOLR
1.0000 g | Freq: Once | INTRAMUSCULAR | Status: AC
  Administered 2020-04-24: 1 g via INTRAMUSCULAR
  Filled 2020-04-24: qty 10

## 2020-04-24 MED ORDER — LIDOCAINE HCL 1 % IJ SOLN
INTRAMUSCULAR | Status: AC
  Filled 2020-04-24: qty 20

## 2020-04-24 NOTE — ED Provider Notes (Addendum)
[facility] EMERGENCY DEPARTMENT Provider Note  CSN: [id number] Arrival date & time: 04/24/20 1538    History Chief Complaint  Patient presents with  . Urinary Tract Infection    HPI  Tiffany Mack is a 79 y.o. female with history of dementia brought to the ED via EMS from her ALF where she was found half off the bed, holding on to the bedrail. She is complaining of back pain but unable to provide any more details. She was also noted to have incontinence of urine with a foul smell. She has a history of UTI. Son at bedside reports she seems a little more confused than usual which is common when she has a UTI. She does not have a history of back pain. She has not had any recent fever, vomiting or other symptoms as far as he knows.    Past Medical History:  Diagnosis Date  . Anxiety   . Coronary artery disease   . COVID-19 07/26/2019  . Dementia [facility])     Past Surgical History:  Procedure Laterality Date  . CARDIAC CATHETERIZATION N/A 08/04/2015   Procedure: Left Heart Cath and Coronary Angiography;  Surgeon: Rinaldo Cloud, MD;  Location: [facility] INVASIVE CV LAB;  Service: Cardiovascular;  Laterality: N/A;  . CARDIAC CATHETERIZATION N/A 08/08/2015   Procedure: Coronary Stent Intervention;  Surgeon: Rinaldo Cloud, MD;  Location: [facility] INVASIVE CV LAB;  Service: Cardiovascular;  Laterality: N/A;    No family history on file.  Social History   Tobacco Use  . Smoking status: Never Smoker  . Smokeless tobacco: Never Used  Vaping Use  . Vaping Use: Never used  Substance Use Topics  . Alcohol use: No  . Drug use: No     Home Medications Prior to Admission medications   Medication Sig Start Date End Date Taking? Authorizing Provider  aspirin EC 81 MG EC tablet Take 1 tablet (81 mg total) by mouth daily. Patient taking differently: Take 40.5 mg by mouth 2 (two) times daily.  08/11/15   Rinaldo Cloud, MD  atorvastatin (LIPITOR) 80 MG tablet Take 1 tablet (80 mg total) by mouth  daily at 6 PM. 08/11/15   Rinaldo Cloud, MD  cephALEXin (KEFLEX) 500 MG capsule Take 1 capsule (500 mg total) by mouth 3 (three) times daily for 7 days. 04/24/20 05/01/20  Pollyann Savoy, MD  memantine (NAMENDA) 10 MG tablet Take 1 tablet every night for 2 weeks, then increase to 1 tablet twice a day Patient taking differently: Take 10 mg by mouth daily.  05/26/19   Van Clines, MD  metoprolol tartrate (LOPRESSOR) 25 MG tablet Take 0.5 tablets (12.5 mg total) by mouth 2 (two) times daily. Patient taking differently: Take 12.5 mg by mouth 2 (two) times daily.  08/11/15   Rinaldo Cloud, MD  mirtazapine (REMERON) 30 MG tablet Take 30 mg by mouth at bedtime.  10/08/18   [provider]  nitroGLYCERIN (NITROSTAT) 0.4 MG SL tablet Place 1 tablet (0.4 mg total) under the tongue every 5 (five) minutes as needed for chest pain (CP or SOB). 08/11/15   Rinaldo Cloud, MD  ondansetron (ZOFRAN) 8 MG tablet Take 8 mg by mouth every 8 (eight) hours as needed for nausea/vomiting. 03/17/18   [provider]  pantoprazole (PROTONIX) 40 MG tablet Take 1 tablet (40 mg total) by mouth daily at 6 (six) AM. 08/11/15   Rinaldo Cloud, MD  rivastigmine (EXELON) 13.3 MG/24HR Place 13.3 mg onto the skin daily.  06/22/19  [provider]  venlafaxine XR (EFFEXOR-XR) 37.5 MG 24 hr capsule Take 37.5 mg by mouth daily with breakfast.    [provider]     Allergies    Patient has no known allergies.   Review of Systems   Review of Systems Unable to assess due to mental status.    Physical Exam BP (!) 155/58 (BP Location: Right Arm)   Pulse 78   Temp 98.7 F (37.1 C) (Oral)   Resp 18   SpO2 97%   Physical Exam Vitals and nursing note reviewed.  Constitutional:      Appearance: Normal appearance.  HENT:     Head: Normocephalic and atraumatic.     Nose: Nose normal.     Mouth/Throat:     Mouth: Mucous membranes are moist.  Eyes:     Extraocular Movements: Extraocular  movements intact.     Conjunctiva/sclera: Conjunctivae normal.  Cardiovascular:     Rate and Rhythm: Normal rate.  Pulmonary:     Effort: Pulmonary effort is normal.     Breath sounds: Normal breath sounds.  Abdominal:     General: Abdomen is flat.     Palpations: Abdomen is soft.     Tenderness: There is no abdominal tenderness.  Musculoskeletal:        General: No swelling. Normal range of motion.     Cervical back: Neck supple.     Comments: No focal tenderness across her diffuse back, specifically no midline bony tenderness.   Skin:    General: Skin is warm and dry.  Neurological:     General: No focal deficit present.     Mental Status: She is alert. She is disoriented.  Psychiatric:        Mood and Affect: Mood normal.      ED Results / Procedures / Treatments   Labs (all labs ordered are listed, but only abnormal results are displayed) Labs Reviewed  URINALYSIS, ROUTINE W REFLEX MICROSCOPIC - Abnormal; Notable for the following components:      Result Value   APPearance CLOUDY (*)    Hgb urine dipstick MODERATE (*)    Ketones, ur 20 (*)    Leukocytes,Ua LARGE (*)    WBC, UA >50 (*)    Bacteria, UA MANY (*)    All other components within normal limits  COMPREHENSIVE METABOLIC PANEL - Abnormal; Notable for the following components:   Glucose, Bld 125 (*)    Total Bilirubin 1.3 (*)    All other components within normal limits  CBC - Abnormal; Notable for the following components:   WBC 15.3 (*)    All other components within normal limits  URINE CULTURE    EKG None  Radiology No results found.  Procedures Procedures  Medications Ordered in the ED Medications  cefTRIAXone (ROCEPHIN) injection 1 g (has no administration in time range)  acetaminophen (TYLENOL) tablet 650 mg (650 mg Oral Given 04/24/20 1824)     MDM Rules/Calculators/A&P MDM Patient with symptoms concerning for UTI. Not febrile in the ED. Will check labs and reassess. APAP for pain.    ED Course  I have reviewed the triage vital signs and the nursing notes.  Pertinent labs & imaging results that were available during my care of the patient were reviewed by me and considered in my medical decision making (see chart for details).  Clinical Course as of Apr 24 1937  Mon Apr 24, 2020  1706 CMP normal. CBC with mild leukocytosis.    [  CS]  1935 UA confirms UTI. Patient otherwise looks well, most recent positive urine culture showed pan-sensitive e-coli. Will give a dose of Rocephin here and discharge to her LTCF for further treatment. IV is no longer working, so will have RN give IM.    [CS]    Clinical Course User Index [CS] Pollyann Savoy, MD    Final Clinical Impression(s) / ED Diagnoses Final diagnoses:  Acute cystitis without hematuria    Rx / DC Orders ED Discharge Orders         Ordered    cephALEXin (KEFLEX) 500 MG capsule  3 times daily,   Status:  Discontinued        04/24/20 1937    cephALEXin (KEFLEX) 500 MG capsule  3 times daily        04/24/20 1937           Pollyann Savoy, MD 04/24/20 1715    Pollyann Savoy, MD 04/24/20 [phone]

## 2020-04-24 NOTE — ED Triage Notes (Signed)
Per EMS: Patient was having back pain reportedly and patient's sister reports she was halfway on/off the bed with a puddle of urine. Patient has a hx of UTI and kidney stones. Strong smell of ammonia per EMS.

## 2020-04-26 DIAGNOSIS — N3000 Acute cystitis without hematuria: Secondary | ICD-10-CM | POA: Diagnosis not present

## 2020-04-26 DIAGNOSIS — R69 Illness, unspecified: Secondary | ICD-10-CM | POA: Diagnosis not present

## 2020-04-27 LAB — URINE CULTURE: Culture: >=100000 — AB

## 2020-04-28 ENCOUNTER — Telehealth: Payer: Self-pay

## 2020-04-28 NOTE — Progress Notes (Signed)
ED Antimicrobial Stewardship Positive Culture Follow Up   Tiffany Mack is an 79 y.o. female who presented to [facility] on 04/24/2020 with a chief complaint of back pain, found to have incontinence of urine, hx of UTI Chief Complaint  Patient presents with  . Urinary Tract Infection    Recent Results (from the past 720 hour(s))  Urine culture     Status: Abnormal   Collection Time: 04/24/20  3:51 PM   Specimen: Urine, Clean Catch  Result Value Ref Range Status   Specimen Description   Final    URINE, CLEAN CATCH Performed at [facility], [address] [place], [place] [address]    Special Requests   Final    NONE Performed at [facility], [address] [place], [place] [address]    Culture (A)  Final    >=100,000 COLONIES/mL ESCHERICHIA COLI Confirmed Extended Spectrum Beta-Lactamase Producer (ESBL).  In bloodstream infections from ESBL organisms, carbapenems are preferred over piperacillin/tazobactam. They are shown to have a lower risk of mortality.    Report Status 04/27/2020 FINAL  Final   Organism ID, Bacteria ESCHERICHIA COLI (A)  Final      Susceptibility   Escherichia coli - MIC*    AMPICILLIN >=32 RESISTANT Resistant     CEFAZOLIN >=64 RESISTANT Resistant     CEFTRIAXONE >=64 RESISTANT Resistant     CIPROFLOXACIN >=4 RESISTANT Resistant     GENTAMICIN <=1 SENSITIVE Sensitive     IMIPENEM <=0.25 SENSITIVE Sensitive     NITROFURANTOIN <=16 SENSITIVE Sensitive     TRIMETH/SULFA >=320 RESISTANT Resistant     AMPICILLIN/SULBACTAM >=32 RESISTANT Resistant     PIP/TAZO 8 SENSITIVE Sensitive     * >=100,000 COLONIES/mL ESCHERICHIA COLI    [x]  Treated with keflex, organism resistant to prescribed antimicrobial []  Patient discharged originally without antimicrobial agent and treatment is now indicated  New antibiotic prescription: macrobid 100mg  po twice daily x 5 days  ED Provider: PA-C   04/28/2020, 10:10 AM Clinical Pharmacist [phone]

## 2020-04-28 NOTE — Telephone Encounter (Signed)
Pt needs chagne in Abx for UC Done ED 04/24/20 per Sharen Heck PA.  Stop Cephalexin and start Macrobid 100mg  PC BID x 5 days   Unable to reach pt of familyon pone  Will send letter to home

## 2020-05-08 ENCOUNTER — Emergency Department (HOSPITAL_BASED_OUTPATIENT_CLINIC_OR_DEPARTMENT_OTHER): Payer: Medicare HMO

## 2020-05-08 ENCOUNTER — Encounter (HOSPITAL_BASED_OUTPATIENT_CLINIC_OR_DEPARTMENT_OTHER): Payer: Self-pay

## 2020-05-08 ENCOUNTER — Emergency Department (HOSPITAL_BASED_OUTPATIENT_CLINIC_OR_DEPARTMENT_OTHER)
Admission: EM | Admit: 2020-05-08 | Discharge: 2020-05-09 | Disposition: A | Discharge status: Home / Self Care | Payer: Medicare HMO | Attending: Emergency Medicine | Admitting: Emergency Medicine

## 2020-05-08 ENCOUNTER — Other Ambulatory Visit: Payer: Self-pay

## 2020-05-08 DIAGNOSIS — R69 Illness, unspecified: Secondary | ICD-10-CM | POA: Diagnosis not present

## 2020-05-08 DIAGNOSIS — M5459 Other low back pain: Secondary | ICD-10-CM | POA: Diagnosis not present

## 2020-05-08 DIAGNOSIS — Z79899 Other long term (current) drug therapy: Secondary | ICD-10-CM | POA: Insufficient documentation

## 2020-05-08 DIAGNOSIS — I10 Essential (primary) hypertension: Secondary | ICD-10-CM | POA: Diagnosis not present

## 2020-05-08 DIAGNOSIS — R9082 White matter disease, unspecified: Secondary | ICD-10-CM | POA: Diagnosis not present

## 2020-05-08 DIAGNOSIS — F0390 Unspecified dementia without behavioral disturbance: Secondary | ICD-10-CM | POA: Insufficient documentation

## 2020-05-08 DIAGNOSIS — Z7982 Long term (current) use of aspirin: Secondary | ICD-10-CM | POA: Diagnosis not present

## 2020-05-08 DIAGNOSIS — R55 Syncope and collapse: Secondary | ICD-10-CM | POA: Diagnosis not present

## 2020-05-08 DIAGNOSIS — I2510 Atherosclerotic heart disease of native coronary artery without angina pectoris: Secondary | ICD-10-CM | POA: Insufficient documentation

## 2020-05-08 DIAGNOSIS — Z8616 Personal history of COVID-19: Secondary | ICD-10-CM | POA: Insufficient documentation

## 2020-05-08 DIAGNOSIS — M549 Dorsalgia, unspecified: Secondary | ICD-10-CM | POA: Diagnosis present

## 2020-05-08 DIAGNOSIS — M545 Low back pain, unspecified: Secondary | ICD-10-CM | POA: Diagnosis not present

## 2020-05-08 DIAGNOSIS — S3993XA Unspecified injury of pelvis, initial encounter: Secondary | ICD-10-CM | POA: Diagnosis not present

## 2020-05-08 DIAGNOSIS — I7090 Unspecified atherosclerosis: Secondary | ICD-10-CM | POA: Diagnosis not present

## 2020-05-08 DIAGNOSIS — I708 Atherosclerosis of other arteries: Secondary | ICD-10-CM | POA: Diagnosis not present

## 2020-05-08 LAB — CBC WITH DIFFERENTIAL/PLATELET
Abs Immature Granulocytes: 0.04 10*3/uL (ref 0.00–0.07)
Basophils Absolute: 0.1 10*3/uL (ref 0.0–0.1)
Basophils Relative: 0 %
Eosinophils Absolute: 0.1 10*3/uL (ref 0.0–0.5)
Eosinophils Relative: 0 %
HCT: 43.7 % (ref 36.0–46.0)
Hemoglobin: 14.1 g/dL (ref 12.0–15.0)
Immature Granulocytes: 0 %
Lymphocytes Relative: 17 %
Lymphs Abs: 2 10*3/uL (ref 0.7–4.0)
MCH: 28.5 pg (ref 26.0–34.0)
MCHC: 32.3 g/dL (ref 30.0–36.0)
MCV: 88.3 fL (ref 80.0–100.0)
Monocytes Absolute: 0.7 10*3/uL (ref 0.1–1.0)
Monocytes Relative: 6 %
Neutro Abs: 8.6 10*3/uL — ABNORMAL HIGH (ref 1.7–7.7)
Neutrophils Relative %: 77 %
Platelets: 205 10*3/uL (ref 150–400)
RBC: 4.95 MIL/uL (ref 3.87–5.11)
RDW: 13.2 % (ref 11.5–15.5)
WBC: 11.4 10*3/uL — ABNORMAL HIGH (ref 4.0–10.5)
nRBC: 0 % (ref 0.0–0.2)

## 2020-05-08 LAB — URINALYSIS, ROUTINE W REFLEX MICROSCOPIC
Bilirubin Urine: NEGATIVE
Glucose, UA: NEGATIVE mg/dL
Hgb urine dipstick: NEGATIVE
Ketones, ur: 40 mg/dL — AB
Leukocytes,Ua: NEGATIVE
Nitrite: NEGATIVE
Protein, ur: NEGATIVE mg/dL
Specific Gravity, Urine: >1.03 — ABNORMAL HIGH (ref 1.005–1.030)
pH: 5.5 (ref 5.0–8.0)

## 2020-05-08 LAB — BASIC METABOLIC PANEL
Anion gap: 11 (ref 5–15)
BUN: 18 mg/dL (ref 8–23)
CO2: 30 mmol/L (ref 22–32)
Calcium: 9.1 mg/dL (ref 8.9–10.3)
Chloride: 98 mmol/L (ref 98–111)
Creatinine, Ser: 0.74 mg/dL (ref 0.44–1.00)
GFR, Estimated: >60 mL/min (ref >60)
Glucose, Bld: 108 mg/dL — ABNORMAL HIGH (ref 70–99)
Potassium: 3.5 mmol/L (ref 3.5–5.1)
Sodium: 139 mmol/L (ref 135–145)

## 2020-05-08 NOTE — Discharge Instructions (Addendum)
Patient had CT scan of the head that was normal.  X-rays of her low back and pelvis were normal.  Lab work normal.  Overall she appears to be stable for discharge.  Recommend continued hydration and care.

## 2020-05-08 NOTE — ED Notes (Signed)
[facility]; [phone]; spoke with Deanna Artis, patient's nurse, advised that patient will be discharged and [facility] called for transport. Reviewed discharge instructions with her. Verbalized understanding.

## 2020-05-08 NOTE — ED Notes (Signed)
Pt in Xray/CT unable to complete lab/IV at this time.

## 2020-05-08 NOTE — ED Notes (Signed)
Called [employer], spoke to [place], French Ana assisted with assessment questions/current medications, per French Ana pt found lying on floor Friday, per French Ana pt unable to stand today with complaint of lower back pain and R leg pain.

## 2020-05-08 NOTE — ED Notes (Signed)
EMS transport from [facility], [place].

## 2020-05-08 NOTE — ED Provider Notes (Signed)
[facility] EMERGENCY DEPARTMENT Provider Note   CSN: [id number] Arrival date & time: 05/08/20  1754     History Chief Complaint  Patient presents with  . Back Pain    Tiffany MechGayle Mack is a 79 y.o. female.  Patient with unwitnessed fall 2 days ago.  May be have some back pain.  Having some more difficulty with ambulation than normal.  Not on blood thinners.  Does have dementia.  Patient denies any pain currently on my examination.  May be a concern for UTI.  The history is provided by the patient and a caregiver.  Illness Severity:  Mild Onset quality:  Gradual Timing:  Intermittent Chronicity:  New Associated symptoms: no abdominal pain, no chest pain, no cough, no ear pain, no fever, no rash, no shortness of breath, no sore throat and no vomiting        Past Medical History:  Diagnosis Date  . Anxiety   . Coronary artery disease   . COVID-19 07/26/2019  . Dementia [facility](HCC)     Patient Active Problem List   Diagnosis Date Noted  . Pneumonia due to COVID-19 virus 07/28/2019  . Acute cystitis without hematuria   . Acute encephalopathy 07/26/2019  . Pressure injury of skin 07/26/2019  . CAP (community acquired pneumonia) 07/26/2019  . Hypokalemia 07/26/2019  . Acute lower UTI 07/26/2019  . Depression 07/26/2019  . Malnutrition of moderate degree 03/24/2018  . 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 Vomiting 03/21/2018  . HTN (hypertension) 03/21/2018  . Dementia (HCC) 03/21/2018  . CAD (coronary artery disease) 03/21/2018  . Acute non Q wave MI (myocardial infarction), initial episode of care [facility](HCC) 08/04/2015    Past Surgical History:  Procedure Laterality Date  . CARDIAC CATHETERIZATION N/A 08/04/2015   Procedure: Left Heart Cath and Coronary Angiography;  Surgeon: Rinaldo CloudMohan Harwani, MD;  [facility];  Service: Cardiovascular;  Laterality: N/A;  . CARDIAC CATHETERIZATION N/A 08/08/2015   Procedure: Coronary Stent Intervention;  Surgeon:  Rinaldo CloudMohan Harwani, MD;  Location: [facility] INVASIVE CV LAB;  Service: Cardiovascular;  Laterality: N/A;     OB History   No obstetric history on file.     History reviewed. No pertinent family history.  Social History   Tobacco Use  . Smoking status: Never Smoker  . Smokeless tobacco: Never Used  Vaping Use  . Vaping Use: Never used  Substance Use Topics  . Alcohol use: No  . Drug use: No    Home Medications Prior to Admission medications   Medication Sig Start Date End Date Taking? Authorizing Provider  aspirin EC 81 MG EC tablet Take 1 tablet (81 mg total) by mouth daily. Patient taking differently: Take 40.5 mg by mouth 2 (two) times daily.  08/11/15   [name], Mohan, MD  atorvastatin (LIPITOR) 80 MG tablet Take 1 tablet (80 mg total) by mouth daily at 6 PM. 08/11/15   [name], Mohan, MD  memantine (NAMENDA) 10 MG tablet Take 1 tablet every night for 2 weeks, then increase to 1 tablet twice a day Patient taking differently: Take 10 mg by mouth daily.  05/26/19   Van ClinesAquino, Karen M, MD  metoprolol tartrate (LOPRESSOR) 25 MG tablet Take 0.5 tablets (12.5 mg total) by mouth 2 (two) times daily. Patient taking differently: Take 12.5 mg by mouth 2 (two) times daily.  08/11/15   [name], Mohan, MD  mirtazapine (REMERON) 30 MG tablet Take 30 mg by mouth at bedtime.  10/08/18   [provider]  nitroGLYCERIN (  NITROSTAT) 0.4 MG SL tablet Place 1 tablet (0.4 mg total) under the tongue every 5 (five) minutes as needed for chest pain (CP or SOB). 08/11/15   Rinaldo Cloud, MD  ondansetron (ZOFRAN) 8 MG tablet Take 8 mg by mouth every 8 (eight) hours as needed for nausea/vomiting. 03/17/18   [provider]  pantoprazole (PROTONIX) 40 MG tablet Take 1 tablet (40 mg total) by mouth daily at 6 (six) AM. 08/11/15   Rinaldo Cloud, MD  rivastigmine (EXELON) 13.3 MG/24HR Place 13.3 mg onto the skin daily.  06/22/19   [provider]  venlafaxine XR (EFFEXOR-XR) 37.5 MG 24 hr capsule Take  37.5 mg by mouth daily with breakfast.    [provider]    Allergies    Patient has no known allergies.  Review of Systems   Review of Systems  Constitutional: Negative for chills and fever.  HENT: Negative for ear pain and sore throat.   Eyes: Negative for pain and visual disturbance.  Respiratory: Negative for cough and shortness of breath.   Cardiovascular: Negative for chest pain and palpitations.  Gastrointestinal: Negative for abdominal pain and vomiting.  Genitourinary: Negative for dysuria and hematuria.  Musculoskeletal: Positive for back pain. Negative for arthralgias.  Skin: Negative for color change and rash.  Neurological: Negative for seizures and syncope.  All other systems reviewed and are negative.   Physical Exam Updated Vital Signs BP (!) 141/51   Pulse 73   Temp 98.5 F (36.9 C) (Oral)   Resp 18   SpO2 96%   Physical Exam Vitals and nursing note reviewed.  Constitutional:      General: She is not in acute distress.    Appearance: She is well-developed. She is not ill-appearing.  HENT:     Head: Normocephalic and atraumatic.     Nose: Nose normal.     Mouth/Throat:     Mouth: Mucous membranes are moist.  Eyes:     Extraocular Movements: Extraocular movements intact.     Conjunctiva/sclera: Conjunctivae normal.     Pupils: Pupils are equal, round, and reactive to light.  Cardiovascular:     Rate and Rhythm: Normal rate and regular rhythm.     Pulses: Normal pulses.     Heart sounds: Normal heart sounds. No murmur heard.   Pulmonary:     Effort: Pulmonary effort is normal. No respiratory distress.     Breath sounds: Normal breath sounds.  Abdominal:     Palpations: Abdomen is soft.     Tenderness: There is no abdominal tenderness.  Musculoskeletal:        General: No tenderness. Normal range of motion.     Cervical back: Normal range of motion and neck supple.  Skin:    General: Skin is warm and dry.  Neurological:     General:  No focal deficit present.     Mental Status: She is alert.     Cranial Nerves: No cranial nerve deficit.     Sensory: No sensory deficit.     Motor: No weakness.     ED Results / Procedures / Treatments   Labs (all labs ordered are listed, but only abnormal results are displayed) Labs Reviewed  CBC WITH DIFFERENTIAL/PLATELET - Abnormal; Notable for the following components:      Result Value   WBC 11.4 (*)    Neutro Abs 8.6 (*)    All other components within normal limits  BASIC METABOLIC PANEL - Abnormal; Notable for the following  components:   Glucose, Bld 108 (*)    All other components within normal limits  URINALYSIS, ROUTINE W REFLEX MICROSCOPIC - Abnormal; Notable for the following components:   Specific Gravity, Urine >1.030 (*)    Ketones, ur 40 (*)    All other components within normal limits    EKG None  Radiology DG Lumbar Spine Complete  Result Date: 05/08/2020 CLINICAL DATA:  79 year old female found down. Pain. EXAM: LUMBAR SPINE - COMPLETE 4+ VIEW COMPARISON:  Pelvis radiograph today. CT Abdomen and Pelvis 03/21/2018. FINDINGS: Transitional lumbosacral anatomy, vestigial S1-S2 disc space and partially lumbarized S1 suspected. Normal lumbar segmentation otherwise. Stable vertebral height and alignment since 2019. No acute osseous abnormality identified. No pars fracture. Chronic disc and endplate degeneration maximal at L2-L3 as before. Sacrum and coccyx appear stable. Calcified aortic atherosclerosis. Negative visible bowel gas pattern. IMPRESSION: No acute osseous abnormality identified in the lumbar spine. Electronically Signed   By: Odessa Fleming M.D.   On: 05/08/2020 19:16   DG Pelvis 1-2 Views  Result Date: 05/08/2020 CLINICAL DATA:  79 year old female found down.  Pain. EXAM: PELVIS - 1-2 VIEW COMPARISON:  Pelvis radiograph 08/23/2019. FINDINGS: Femoral heads remain normally located. Stable bone mineralization. Pelvis appears stable and intact, with chronic  asymmetric calcification of the iliolumbar ligaments. Grossly stable and intact proximal femurs. Negative visible lower abdominal and pelvic visceral contours. Calcified femoral artery atherosclerosis. IMPRESSION: No acute fracture or dislocation identified about the pelvis. If there is lateralizing hip pain recommend dedicated hip series. Electronically Signed   By: Odessa Fleming M.D.   On: 05/08/2020 19:13   CT Head Wo Contrast  Result Date: 05/08/2020 CLINICAL DATA:  79 year old female found down three days ago. Dementia. EXAM: CT HEAD WITHOUT CONTRAST TECHNIQUE: Contiguous axial images were obtained from the base of the skull through the vertex without intravenous contrast. COMPARISON:  Brain MRI 04/17/2018.  Head CT 08/23/2019. FINDINGS: Study is mildly degraded by motion artifact despite repeated imaging attempts. Brain: Motion artifact at the vertex. Stable cerebral volume since January. No midline shift, ventriculomegaly, mass effect, evidence of mass lesion, intracranial hemorrhage or evidence of cortically based acute infarction. Stable gray-white matter differentiation, with patchy and confluent bilateral white matter hypodensity. Vascular: Calcified atherosclerosis at the skull base. Skull: No acute osseous abnormality identified. Sinuses/Orbits: Visualized paranasal sinuses and mastoids are stable and well pneumatized. Other: No acute orbit or scalp soft tissue finding. IMPRESSION: No acute intracranial abnormality. Stable chronic white matter disease and CT appearance of the brain allowing for some motion. Electronically Signed   By: Odessa Fleming M.D.   On: 05/08/2020 19:12    Procedures Procedures (including critical care time)  Medications Ordered in ED Medications - No data to display  ED Course  I have reviewed the triage vital signs and the nursing notes.  Pertinent labs & imaging results that were available during my care of the patient were reviewed by me and considered in my medical  decision making (see chart for details).    MDM Rules/Calculators/A&P                          Tiffany Mack is a 79 year old female with history of dementia who presents from nursing home with back pain.  Patient had fall 2 days ago.  Was having some difficulty with ambulation today.  Did not get assessed when she had her original fall.  Normal vitals.  Patient is overall pleasant.  Denies any  pain.  Is able to move her legs without much difficulty.  Has no pain with range of motion of her hips.  Overall appears well.  Lab work showed no significant anemia, electrolyte abnormality, kidney injury.  Urinalysis negative for infection.  Head CT showed no acute findings.  No fractures on pelvic or lumbar x-ray.  Overall patient appears well.  Discharged back to facility.  This chart was dictated using voice recognition software.  Despite best efforts to proofread,  errors can occur which can change the documentation meaning.   Final Clinical Impression(s) / ED Diagnoses Final diagnoses:  Low back pain, unspecified back pain laterality, unspecified chronicity, unspecified whether sciatica present    Rx / DC Orders ED Discharge Orders    None       Virgina Norfolk, DO 05/08/20 2038

## 2020-05-09 DIAGNOSIS — R52 Pain, unspecified: Secondary | ICD-10-CM | POA: Diagnosis not present

## 2020-05-09 DIAGNOSIS — W19XXXA Unspecified fall, initial encounter: Secondary | ICD-10-CM | POA: Diagnosis not present

## 2020-05-09 DIAGNOSIS — M2550 Pain in unspecified joint: Secondary | ICD-10-CM | POA: Diagnosis not present

## 2020-05-09 DIAGNOSIS — Z7401 Bed confinement status: Secondary | ICD-10-CM | POA: Diagnosis not present

## 2020-05-09 DIAGNOSIS — M5489 Other dorsalgia: Secondary | ICD-10-CM | POA: Diagnosis not present

## 2020-05-10 ENCOUNTER — Encounter (HOSPITAL_BASED_OUTPATIENT_CLINIC_OR_DEPARTMENT_OTHER): Payer: Self-pay

## 2020-05-10 ENCOUNTER — Other Ambulatory Visit: Payer: Self-pay

## 2020-05-10 ENCOUNTER — Emergency Department (HOSPITAL_BASED_OUTPATIENT_CLINIC_OR_DEPARTMENT_OTHER): Payer: Medicare HMO

## 2020-05-10 ENCOUNTER — Emergency Department (HOSPITAL_BASED_OUTPATIENT_CLINIC_OR_DEPARTMENT_OTHER)
Admission: EM | Admit: 2020-05-10 | Discharge: 2020-05-10 | Disposition: A | Discharge status: Home / Self Care | Payer: Medicare HMO | Attending: Emergency Medicine | Admitting: Emergency Medicine

## 2020-05-10 DIAGNOSIS — I10 Essential (primary) hypertension: Secondary | ICD-10-CM | POA: Diagnosis not present

## 2020-05-10 DIAGNOSIS — M5489 Other dorsalgia: Secondary | ICD-10-CM | POA: Diagnosis not present

## 2020-05-10 DIAGNOSIS — M2550 Pain in unspecified joint: Secondary | ICD-10-CM | POA: Diagnosis not present

## 2020-05-10 DIAGNOSIS — M545 Low back pain, unspecified: Secondary | ICD-10-CM | POA: Diagnosis not present

## 2020-05-10 DIAGNOSIS — M542 Cervicalgia: Secondary | ICD-10-CM | POA: Diagnosis not present

## 2020-05-10 DIAGNOSIS — Z8616 Personal history of COVID-19: Secondary | ICD-10-CM | POA: Insufficient documentation

## 2020-05-10 DIAGNOSIS — W19XXXA Unspecified fall, initial encounter: Secondary | ICD-10-CM | POA: Insufficient documentation

## 2020-05-10 DIAGNOSIS — I2510 Atherosclerotic heart disease of native coronary artery without angina pectoris: Secondary | ICD-10-CM | POA: Diagnosis not present

## 2020-05-10 DIAGNOSIS — R531 Weakness: Secondary | ICD-10-CM | POA: Diagnosis not present

## 2020-05-10 DIAGNOSIS — J984 Other disorders of lung: Secondary | ICD-10-CM | POA: Diagnosis not present

## 2020-05-10 DIAGNOSIS — Z7982 Long term (current) use of aspirin: Secondary | ICD-10-CM | POA: Insufficient documentation

## 2020-05-10 DIAGNOSIS — Z7401 Bed confinement status: Secondary | ICD-10-CM | POA: Diagnosis not present

## 2020-05-10 DIAGNOSIS — M47812 Spondylosis without myelopathy or radiculopathy, cervical region: Secondary | ICD-10-CM | POA: Diagnosis not present

## 2020-05-10 DIAGNOSIS — I7090 Unspecified atherosclerosis: Secondary | ICD-10-CM | POA: Diagnosis not present

## 2020-05-10 DIAGNOSIS — M4312 Spondylolisthesis, cervical region: Secondary | ICD-10-CM | POA: Diagnosis not present

## 2020-05-10 DIAGNOSIS — Z79899 Other long term (current) drug therapy: Secondary | ICD-10-CM | POA: Diagnosis not present

## 2020-05-10 DIAGNOSIS — M5459 Other low back pain: Secondary | ICD-10-CM | POA: Diagnosis not present

## 2020-05-10 DIAGNOSIS — F0390 Unspecified dementia without behavioral disturbance: Secondary | ICD-10-CM | POA: Diagnosis not present

## 2020-05-10 DIAGNOSIS — R69 Illness, unspecified: Secondary | ICD-10-CM | POA: Diagnosis not present

## 2020-05-10 DIAGNOSIS — G9389 Other specified disorders of brain: Secondary | ICD-10-CM | POA: Diagnosis not present

## 2020-05-10 DIAGNOSIS — I6782 Cerebral ischemia: Secondary | ICD-10-CM | POA: Diagnosis not present

## 2020-05-10 NOTE — ED Provider Notes (Signed)
[facility] EMERGENCY DEPARTMENT Provider Note   CSN: [id number] Arrival date & time: 05/10/20  1101     History Chief Complaint  Patient presents with  . Fall    LEVEL 5 CAVEAT - DEMENTIA  Tiffany Mack is a 79 y.o. female with PMHx Dementia who presents to the ED today via EMS for unwitnessed fall. Per EMS pt was found laying supine on floor by staff at [facility]. They last saw her approximately 30 minutes-1 hour prior. Pt does not recall the fall. She was complaining of some lumbar back pain initially however no complaints on arrival with EMS. Unknown LOC. Pt is not anticoagulated.   Per chart review: Pt was seen in the ED on 10/11 with back pain s/p fall that occurred 2 days prior. CT Head negative at that time. Xray of pelvis and lumbar spine without bony abnormalities. Labwork reassuring and U/A without signs of infection and pt was discharged back to facility.   The history is provided by the patient, medical records and the EMS personnel.       Past Medical History:  Diagnosis Date  . Anxiety   . Coronary artery disease   . COVID-19 07/26/2019  . Dementia [facility])     Patient Active Problem List   Diagnosis Date Noted  . Pneumonia due to COVID-19 virus 07/28/2019  . Acute cystitis without hematuria   . Acute encephalopathy 07/26/2019  . Pressure injury of skin 07/26/2019  . CAP (community acquired pneumonia) 07/26/2019  . Hypokalemia 07/26/2019  . Acute lower UTI 07/26/2019  . Depression 07/26/2019  . Malnutrition of moderate degree 03/24/2018  . 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 Vomiting 03/21/2018  . HTN (hypertension) 03/21/2018  . Dementia (HCC) 03/21/2018  . CAD (coronary artery disease) 03/21/2018  . Acute non Q wave MI (myocardial infarction), initial episode of care [facility]) 08/04/2015    Past Surgical History:  Procedure Laterality Date  . CARDIAC CATHETERIZATION N/A 08/04/2015   Procedure: Left Heart Cath and Coronary Angiography;   Surgeon: Rinaldo Cloud, MD;  Location: [facility] INVASIVE CV LAB;  Service: Cardiovascular;  Laterality: N/A;  . CARDIAC CATHETERIZATION N/A 08/08/2015   Procedure: Coronary Stent Intervention;  Surgeon: Rinaldo Cloud, MD;  Location: [facility] INVASIVE CV LAB;  Service: Cardiovascular;  Laterality: N/A;     OB History   No obstetric history on file.     History reviewed. No pertinent family history.  Social History   Tobacco Use  . Smoking status: Never Smoker  . Smokeless tobacco: Never Used  Vaping Use  . Vaping Use: Never used  Substance Use Topics  . Alcohol use: No  . Drug use: No    Home Medications Prior to Admission medications   Medication Sig Start Date End Date Taking? Authorizing Provider  Ascorbic Acid (VITAMIN C) 500 MG PACK Take 1 tablet by mouth 2 (two) times daily.   Yes [provider]  cephALEXin (KEFLEX) 500 MG capsule Take 500 mg by mouth 3 (three) times daily. For 7 days, would have ended 04/30/20 04/24/20  Yes [provider]  miconazole (MICOTIN) 2 % cream Apply 1 application topically 2 (two) times daily.   Yes [provider]  multivitamin-iron-minerals-folic acid (THERAPEUTIC-M) TABS tablet Take 1 tablet by mouth daily.   Yes [provider]  Sunscreens (COPPERTONE SPORT SPF 30 EX) Apply 1 application topically every 2 (two) hours as needed. While in the sun   Yes [provider]  ZINC OXIDE, TOPICAL, (SECURA PROTECTIVE) 10 %  CREA Apply 1 application topically 2 (two) times daily as needed.   Yes [provider]  aspirin EC 81 MG EC tablet Take 1 tablet (81 mg total) by mouth daily. 08/11/15   [name], Mohan, MD  atorvastatin (LIPITOR) 80 MG tablet Take 1 tablet (80 mg total) by mouth daily at 6 PM. 08/11/15   [name], Mohan, MD  LORazepam (ATIVAN) 0.5 MG tablet Take 0.5 mg by mouth 3 (three) times daily as needed. 04/12/20   [provider]  memantine (NAMENDA) 10 MG tablet Take 1 tablet every night for 2  weeks, then increase to 1 tablet twice a day Patient taking differently: Take 10 mg by mouth at bedtime.  05/26/19   Van ClinesAquino, Karen M, MD  metoprolol tartrate (LOPRESSOR) 25 MG tablet Take 0.5 tablets (12.5 mg total) by mouth 2 (two) times daily. 08/11/15   [name], Mohan, MD  mirtazapine (REMERON) 30 MG tablet Take 30 mg by mouth at bedtime.  10/08/18   [provider]  nitroGLYCERIN (NITROSTAT) 0.4 MG SL tablet Place 1 tablet (0.4 mg total) under the tongue every 5 (five) minutes as needed for chest pain (CP or SOB). 08/11/15   [name], Mohan, MD  ondansetron (ZOFRAN) 8 MG tablet Take 8 mg by mouth every 8 (eight) hours as needed for nausea/vomiting. 03/17/18   [provider]  pantoprazole (PROTONIX) 40 MG tablet Take 1 tablet (40 mg total) by mouth daily at 6 (six) AM. 08/11/15   [name], Mohan, MD  rivastigmine (EXELON) 13.3 MG/24HR Place 13.3 mg onto the skin daily.  06/22/19   [provider]  venlafaxine XR (EFFEXOR-XR) 37.5 MG 24 hr capsule Take 37.5 mg by mouth daily with breakfast.    [provider]    Allergies    Patient has no known allergies.  Review of Systems   Review of Systems  Unable to perform ROS: Mental status change    Physical Exam Updated Vital Signs BP (!) 150/58 (BP Location: Right Arm)   Pulse 84   Temp 98.4 F (36.9 C) (Oral)   Resp 18   Ht 5\' 9"  (1.753 m)   Wt 71.2 kg   SpO2 100%   BMI 23.18 kg/m   Physical Exam Vitals and nursing note reviewed.  Constitutional:      Appearance: She is not ill-appearing.     Comments: Pleasantly demented.   HENT:     Head: Normocephalic and atraumatic.     Comments: No raccoon's sign or battle's sign. Negative hemotympanum bilaterally.     Right Ear: Tympanic membrane normal.     Left Ear: Tympanic membrane normal.  Eyes:     Conjunctiva/sclera: Conjunctivae normal.  Neck:     Comments: Mild diffuse neck pain. ROM intact. No stepoffs or deformities appreciated.    Cardiovascular:     Rate and Rhythm: Normal rate and regular rhythm.  Pulmonary:     Effort: Pulmonary effort is normal.     Breath sounds: Normal breath sounds. No wheezing, rhonchi or rales.  Abdominal:     Palpations: Abdomen is soft.     Tenderness: There is no abdominal tenderness. There is no guarding or rebound.  Musculoskeletal:     Cervical back: Neck supple.     Comments: No T or L midline spinal TTP. No tenderness to all extremities. Pt moving all extremities without difficulty.   Skin:    General: Skin is warm and dry.  Neurological:     Mental Status: She is alert.  Mental status is at baseline.     ED Results / Procedures / Treatments   Labs (all labs ordered are listed, but only abnormal results are displayed) Labs Reviewed - No data to display  EKG None  Radiology DG Lumbar Spine Complete  Result Date: 05/08/2020 CLINICAL DATA:  79 year old female found down. Pain. EXAM: LUMBAR SPINE - COMPLETE 4+ VIEW COMPARISON:  Pelvis radiograph today. CT Abdomen and Pelvis 03/21/2018. FINDINGS: Transitional lumbosacral anatomy, vestigial S1-S2 disc space and partially lumbarized S1 suspected. Normal lumbar segmentation otherwise. Stable vertebral height and alignment since 2019. No acute osseous abnormality identified. No pars fracture. Chronic disc and endplate degeneration maximal at L2-L3 as before. Sacrum and coccyx appear stable. Calcified aortic atherosclerosis. Negative visible bowel gas pattern. IMPRESSION: No acute osseous abnormality identified in the lumbar spine. Electronically Signed   By: Odessa Fleming M.D.   On: 05/08/2020 19:16   DG Pelvis 1-2 Views  Result Date: 05/08/2020 CLINICAL DATA:  79 year old female found down.  Pain. EXAM: PELVIS - 1-2 VIEW COMPARISON:  Pelvis radiograph 08/23/2019. FINDINGS: Femoral heads remain normally located. Stable bone mineralization. Pelvis appears stable and intact, with chronic asymmetric calcification of the iliolumbar ligaments.  Grossly stable and intact proximal femurs. Negative visible lower abdominal and pelvic visceral contours. Calcified femoral artery atherosclerosis. IMPRESSION: No acute fracture or dislocation identified about the pelvis. If there is lateralizing hip pain recommend dedicated hip series. Electronically Signed   By: Odessa Fleming M.D.   On: 05/08/2020 19:13   CT Head Wo Contrast  Result Date: 05/10/2020 CLINICAL DATA:  Unwitnessed fall EXAM: CT HEAD WITHOUT CONTRAST CT CERVICAL SPINE WITHOUT CONTRAST TECHNIQUE: Multidetector CT imaging of the head and cervical spine was performed following the standard protocol without intravenous contrast. Multiplanar CT image reconstructions of the cervical spine were also generated. COMPARISON:  08/23/2019, 05/08/2020 FINDINGS: CT HEAD FINDINGS Brain: No evidence of acute infarction, hemorrhage, hydrocephalus, extra-axial collection or mass lesion/mass effect. Moderate low-density changes within the periventricular and subcortical white matter compatible with chronic microvascular ischemic change. Mild diffuse cerebral volume loss. Vascular: Atherosclerotic calcifications involving the large vessels of the skull base. No unexpected hyperdense vessel. Skull: Normal. Negative for fracture or focal lesion. Sinuses/Orbits: No acute finding. Other: None. CT CERVICAL SPINE FINDINGS Alignment: Facet joints are aligned without dislocation or traumatic listhesis. Facet mediated grade 1 anterolisthesis C4 on C5. Dens and lateral masses are aligned. Skull base and vertebrae: No acute fracture. No primary bone lesion or focal pathologic process. Soft tissues and spinal canal: No prevertebral fluid or swelling. No visible canal hematoma. Disc levels: Degenerative disc disease most pronounced at C6-7. Prominent anterior endplate osteophytes from C4-5 through C6-7. Multilevel facet arthropathy, left worse than right. Upper chest: Left apical pleuroparenchymal scarring. Right lung apex was not  included within the field of view. Other: None. IMPRESSION: CT head: 1. No evidence of acute intracranial process. 2. Moderate chronic microvascular ischemic changes and cerebral volume loss. CT cervical spine: 1. No evidence of acute fracture or traumatic listhesis of the cervical spine. 2. Multilevel degenerative disc disease and facet arthropathy of the cervical spine, most pronounced at C6-7. Electronically Signed   By: Duanne Guess D.O.   On: 05/10/2020 12:30   CT Head Wo Contrast  Result Date: 05/08/2020 CLINICAL DATA:  79 year old female found down three days ago. Dementia. EXAM: CT HEAD WITHOUT CONTRAST TECHNIQUE: Contiguous axial images were obtained from the base of the skull through the vertex without intravenous contrast. COMPARISON:  Brain MRI 04/17/2018.  Head CT 08/23/2019. FINDINGS: Study is mildly degraded by motion artifact despite repeated imaging attempts. Brain: Motion artifact at the vertex. Stable cerebral volume since January. No midline shift, ventriculomegaly, mass effect, evidence of mass lesion, intracranial hemorrhage or evidence of cortically based acute infarction. Stable gray-white matter differentiation, with patchy and confluent bilateral white matter hypodensity. Vascular: Calcified atherosclerosis at the skull base. Skull: No acute osseous abnormality identified. Sinuses/Orbits: Visualized paranasal sinuses and mastoids are stable and well pneumatized. Other: No acute orbit or scalp soft tissue finding. IMPRESSION: No acute intracranial abnormality. Stable chronic white matter disease and CT appearance of the brain allowing for some motion. Electronically Signed   By: Odessa Fleming M.D.   On: 05/08/2020 19:12   CT Cervical Spine Wo Contrast  Result Date: 05/10/2020 CLINICAL DATA:  Unwitnessed fall EXAM: CT HEAD WITHOUT CONTRAST CT CERVICAL SPINE WITHOUT CONTRAST TECHNIQUE: Multidetector CT imaging of the head and cervical spine was performed following the standard protocol  without intravenous contrast. Multiplanar CT image reconstructions of the cervical spine were also generated. COMPARISON:  08/23/2019, 05/08/2020 FINDINGS: CT HEAD FINDINGS Brain: No evidence of acute infarction, hemorrhage, hydrocephalus, extra-axial collection or mass lesion/mass effect. Moderate low-density changes within the periventricular and subcortical white matter compatible with chronic microvascular ischemic change. Mild diffuse cerebral volume loss. Vascular: Atherosclerotic calcifications involving the large vessels of the skull base. No unexpected hyperdense vessel. Skull: Normal. Negative for fracture or focal lesion. Sinuses/Orbits: No acute finding. Other: None. CT CERVICAL SPINE FINDINGS Alignment: Facet joints are aligned without dislocation or traumatic listhesis. Facet mediated grade 1 anterolisthesis C4 on C5. Dens and lateral masses are aligned. Skull base and vertebrae: No acute fracture. No primary bone lesion or focal pathologic process. Soft tissues and spinal canal: No prevertebral fluid or swelling. No visible canal hematoma. Disc levels: Degenerative disc disease most pronounced at C6-7. Prominent anterior endplate osteophytes from C4-5 through C6-7. Multilevel facet arthropathy, left worse than right. Upper chest: Left apical pleuroparenchymal scarring. Right lung apex was not included within the field of view. Other: None. IMPRESSION: CT head: 1. No evidence of acute intracranial process. 2. Moderate chronic microvascular ischemic changes and cerebral volume loss. CT cervical spine: 1. No evidence of acute fracture or traumatic listhesis of the cervical spine. 2. Multilevel degenerative disc disease and facet arthropathy of the cervical spine, most pronounced at C6-7. Electronically Signed   By: Duanne Guess D.O.   On: 05/10/2020 12:30    Procedures Procedures (including critical care time)  Medications Ordered in ED Medications - No data to display  ED Course  I have  reviewed the triage vital signs and the nursing notes.  Pertinent labs & imaging results that were available during my care of the patient were reviewed by me and considered in my medical decision making (see chart for details).    MDM Rules/Calculators/A&P                          79 year old female with hx of dementia presenting to the ED from SNF with unwitnessed fall - last seen 30 min-1 hr before. She was initially complaining of back pain which is why EMS was called however no complaints on their arrival. Pt is pleasantly demented and at baseline. She does appear to have some neck TTP; no tenderness to remainder of body. No signs of head trauma and pt is not anticoagulated however given age and fall will plan for CT head and  CT C spine. Suspect mechanical fall at this time given they saw pt 30 minutes prior without complaints. She did also have blood work done 2 days ago; do not feel she requires bloodwork at this time. Attending physician Dr. Pilar Plate has evaluated pt as well and agrees with plan.   CT head and CT C spine negative at this time. Pt continues to rest comfortably in bed. Will discharge back to SNF.   This note was prepared using Dragon voice recognition software and may include unintentional dictation errors due to the inherent limitations of voice recognition software.  Final Clinical Impression(s) / ED Diagnoses Final diagnoses:  Fall, initial encounter    Rx / DC Orders ED Discharge Orders    None       Discharge Instructions     CT scan of the head and neck were reassuring today without signs of acute fractures or bleeds.  Follow up with PCP regarding ED visit today.  Return to the ED for any worsening symptoms.        Tanda Rockers, PA-C 05/10/20 1311    Sabas Sous, MD 05/10/20 1515

## 2020-05-10 NOTE — ED Notes (Signed)
Pt made aware of need for urine sample, unable to void at this time.  

## 2020-05-10 NOTE — ED Triage Notes (Signed)
Pt BIB EMS after being found supine on floor by staff. Staff last saw pt 30 mins-hour prior. Hx of dementia, does not remember fall. Complained of lumbar back pain initially, no complaints at arrival, no thinners, unknown LOC.

## 2020-05-10 NOTE — Discharge Instructions (Addendum)
CT scan of the head and neck were reassuring today without signs of acute fractures or bleeds.  Follow up with PCP regarding ED visit today.  Return to the ED for any worsening symptoms.

## 2020-05-10 NOTE — ED Notes (Addendum)
Pt has dementia at baseline, inappropriate to sign discharge paperwork. Paperwork reviewed with patient and staff at [facility]. Pt provided with water, a blanket and tv while waiting transportation.

## 2020-05-10 NOTE — ED Notes (Signed)
[facility] called for transportation home.  They related it may be 2 hours or more.

## 2020-05-10 NOTE — ED Notes (Signed)
Pt discharged for [facility] with [facility], taken to [facility] where pt lives. NAD. VSS

## 2020-05-10 NOTE — ED Notes (Signed)
Report called to [facility] at [facility] for [facility] discharge. Arranging transport back to facility via [facility].

## 2020-05-10 NOTE — ED Notes (Signed)
ED Provider at bedside. 

## 2020-05-17 DIAGNOSIS — M6281 Muscle weakness (generalized): Secondary | ICD-10-CM | POA: Diagnosis not present

## 2020-05-17 DIAGNOSIS — M545 Low back pain, unspecified: Secondary | ICD-10-CM | POA: Diagnosis not present

## 2020-05-17 DIAGNOSIS — R69 Illness, unspecified: Secondary | ICD-10-CM | POA: Diagnosis not present

## 2020-05-17 DIAGNOSIS — M79606 Pain in leg, unspecified: Secondary | ICD-10-CM | POA: Diagnosis not present

## 2020-05-23 DIAGNOSIS — F32A Depression, unspecified: Secondary | ICD-10-CM | POA: Diagnosis not present

## 2020-05-23 DIAGNOSIS — Z9183 Wandering in diseases classified elsewhere: Secondary | ICD-10-CM | POA: Diagnosis not present

## 2020-05-23 DIAGNOSIS — R32 Unspecified urinary incontinence: Secondary | ICD-10-CM | POA: Diagnosis not present

## 2020-05-23 DIAGNOSIS — I10 Essential (primary) hypertension: Secondary | ICD-10-CM | POA: Diagnosis not present

## 2020-05-23 DIAGNOSIS — Z8701 Personal history of pneumonia (recurrent): Secondary | ICD-10-CM | POA: Diagnosis not present

## 2020-05-23 DIAGNOSIS — R69 Illness, unspecified: Secondary | ICD-10-CM | POA: Diagnosis not present

## 2020-05-23 DIAGNOSIS — Z8616 Personal history of COVID-19: Secondary | ICD-10-CM | POA: Diagnosis not present

## 2020-05-23 DIAGNOSIS — F419 Anxiety disorder, unspecified: Secondary | ICD-10-CM | POA: Diagnosis not present

## 2020-05-23 DIAGNOSIS — Z7982 Long term (current) use of aspirin: Secondary | ICD-10-CM | POA: Diagnosis not present

## 2020-05-23 DIAGNOSIS — Z9181 History of falling: Secondary | ICD-10-CM | POA: Diagnosis not present

## 2020-05-24 DIAGNOSIS — L89151 Pressure ulcer of sacral region, stage 1: Secondary | ICD-10-CM | POA: Diagnosis not present

## 2020-05-24 DIAGNOSIS — R69 Illness, unspecified: Secondary | ICD-10-CM | POA: Diagnosis not present

## 2020-05-24 DIAGNOSIS — K219 Gastro-esophageal reflux disease without esophagitis: Secondary | ICD-10-CM | POA: Diagnosis not present

## 2020-05-26 DIAGNOSIS — F32A Depression, unspecified: Secondary | ICD-10-CM | POA: Diagnosis not present

## 2020-05-26 DIAGNOSIS — Z8701 Personal history of pneumonia (recurrent): Secondary | ICD-10-CM | POA: Diagnosis not present

## 2020-05-26 DIAGNOSIS — Z9181 History of falling: Secondary | ICD-10-CM | POA: Diagnosis not present

## 2020-05-26 DIAGNOSIS — I10 Essential (primary) hypertension: Secondary | ICD-10-CM | POA: Diagnosis not present

## 2020-05-26 DIAGNOSIS — F419 Anxiety disorder, unspecified: Secondary | ICD-10-CM | POA: Diagnosis not present

## 2020-05-26 DIAGNOSIS — R69 Illness, unspecified: Secondary | ICD-10-CM | POA: Diagnosis not present

## 2020-05-26 DIAGNOSIS — Z7982 Long term (current) use of aspirin: Secondary | ICD-10-CM | POA: Diagnosis not present

## 2020-05-26 DIAGNOSIS — Z9183 Wandering in diseases classified elsewhere: Secondary | ICD-10-CM | POA: Diagnosis not present

## 2020-05-26 DIAGNOSIS — Z8616 Personal history of COVID-19: Secondary | ICD-10-CM | POA: Diagnosis not present

## 2020-05-26 DIAGNOSIS — R32 Unspecified urinary incontinence: Secondary | ICD-10-CM | POA: Diagnosis not present

## 2020-05-29 DIAGNOSIS — R32 Unspecified urinary incontinence: Secondary | ICD-10-CM | POA: Diagnosis not present

## 2020-05-29 DIAGNOSIS — Z7982 Long term (current) use of aspirin: Secondary | ICD-10-CM | POA: Diagnosis not present

## 2020-05-29 DIAGNOSIS — F32A Depression, unspecified: Secondary | ICD-10-CM | POA: Diagnosis not present

## 2020-05-29 DIAGNOSIS — Z8616 Personal history of COVID-19: Secondary | ICD-10-CM | POA: Diagnosis not present

## 2020-05-29 DIAGNOSIS — Z9181 History of falling: Secondary | ICD-10-CM | POA: Diagnosis not present

## 2020-05-29 DIAGNOSIS — R69 Illness, unspecified: Secondary | ICD-10-CM | POA: Diagnosis not present

## 2020-05-29 DIAGNOSIS — I10 Essential (primary) hypertension: Secondary | ICD-10-CM | POA: Diagnosis not present

## 2020-05-29 DIAGNOSIS — Z9183 Wandering in diseases classified elsewhere: Secondary | ICD-10-CM | POA: Diagnosis not present

## 2020-05-29 DIAGNOSIS — F419 Anxiety disorder, unspecified: Secondary | ICD-10-CM | POA: Diagnosis not present

## 2020-05-29 DIAGNOSIS — Z8701 Personal history of pneumonia (recurrent): Secondary | ICD-10-CM | POA: Diagnosis not present

## 2020-06-05 DIAGNOSIS — Z9183 Wandering in diseases classified elsewhere: Secondary | ICD-10-CM | POA: Diagnosis not present

## 2020-06-05 DIAGNOSIS — I10 Essential (primary) hypertension: Secondary | ICD-10-CM | POA: Diagnosis not present

## 2020-06-05 DIAGNOSIS — F419 Anxiety disorder, unspecified: Secondary | ICD-10-CM | POA: Diagnosis not present

## 2020-06-05 DIAGNOSIS — R69 Illness, unspecified: Secondary | ICD-10-CM | POA: Diagnosis not present

## 2020-06-05 DIAGNOSIS — Z8701 Personal history of pneumonia (recurrent): Secondary | ICD-10-CM | POA: Diagnosis not present

## 2020-06-05 DIAGNOSIS — Z8616 Personal history of COVID-19: Secondary | ICD-10-CM | POA: Diagnosis not present

## 2020-06-05 DIAGNOSIS — F32A Depression, unspecified: Secondary | ICD-10-CM | POA: Diagnosis not present

## 2020-06-05 DIAGNOSIS — Z7982 Long term (current) use of aspirin: Secondary | ICD-10-CM | POA: Diagnosis not present

## 2020-06-05 DIAGNOSIS — R32 Unspecified urinary incontinence: Secondary | ICD-10-CM | POA: Diagnosis not present

## 2020-06-05 DIAGNOSIS — Z9181 History of falling: Secondary | ICD-10-CM | POA: Diagnosis not present

## 2020-06-12 DIAGNOSIS — Z9181 History of falling: Secondary | ICD-10-CM | POA: Diagnosis not present

## 2020-06-12 DIAGNOSIS — Z8616 Personal history of COVID-19: Secondary | ICD-10-CM | POA: Diagnosis not present

## 2020-06-12 DIAGNOSIS — R69 Illness, unspecified: Secondary | ICD-10-CM | POA: Diagnosis not present

## 2020-06-12 DIAGNOSIS — Z9183 Wandering in diseases classified elsewhere: Secondary | ICD-10-CM | POA: Diagnosis not present

## 2020-06-12 DIAGNOSIS — R32 Unspecified urinary incontinence: Secondary | ICD-10-CM | POA: Diagnosis not present

## 2020-06-12 DIAGNOSIS — F419 Anxiety disorder, unspecified: Secondary | ICD-10-CM | POA: Diagnosis not present

## 2020-06-12 DIAGNOSIS — Z7982 Long term (current) use of aspirin: Secondary | ICD-10-CM | POA: Diagnosis not present

## 2020-06-12 DIAGNOSIS — I10 Essential (primary) hypertension: Secondary | ICD-10-CM | POA: Diagnosis not present

## 2020-06-12 DIAGNOSIS — Z8701 Personal history of pneumonia (recurrent): Secondary | ICD-10-CM | POA: Diagnosis not present

## 2020-06-12 DIAGNOSIS — F32A Depression, unspecified: Secondary | ICD-10-CM | POA: Diagnosis not present

## 2020-08-01 DIAGNOSIS — Z20828 Contact with and (suspected) exposure to other viral communicable diseases: Secondary | ICD-10-CM | POA: Diagnosis not present

## 2020-08-16 DIAGNOSIS — R69 Illness, unspecified: Secondary | ICD-10-CM | POA: Diagnosis not present

## 2020-08-16 DIAGNOSIS — U071 COVID-19: Secondary | ICD-10-CM | POA: Diagnosis not present

## 2020-08-23 DIAGNOSIS — U071 COVID-19: Secondary | ICD-10-CM | POA: Diagnosis not present

## 2020-08-23 DIAGNOSIS — R69 Illness, unspecified: Secondary | ICD-10-CM | POA: Diagnosis not present

## 2020-08-27 ENCOUNTER — Encounter (HOSPITAL_COMMUNITY): Payer: Self-pay

## 2020-08-27 ENCOUNTER — Emergency Department (HOSPITAL_COMMUNITY): Payer: Medicare HMO

## 2020-08-27 ENCOUNTER — Other Ambulatory Visit: Payer: Self-pay

## 2020-08-27 ENCOUNTER — Inpatient Hospital Stay (HOSPITAL_COMMUNITY)
Admission: EM | Admit: 2020-08-27 | Discharge: 2020-09-04 | DRG: 956 | Disposition: A | Discharge status: Skilled Nursing Facility | Payer: Medicare HMO | Source: Skilled Nursing Facility | Attending: Internal Medicine | Admitting: Internal Medicine

## 2020-08-27 DIAGNOSIS — N390 Urinary tract infection, site not specified: Secondary | ICD-10-CM | POA: Diagnosis not present

## 2020-08-27 DIAGNOSIS — I609 Nontraumatic subarachnoid hemorrhage, unspecified: Secondary | ICD-10-CM | POA: Diagnosis not present

## 2020-08-27 DIAGNOSIS — F0390 Unspecified dementia without behavioral disturbance: Secondary | ICD-10-CM | POA: Diagnosis present

## 2020-08-27 DIAGNOSIS — E440 Moderate protein-calorie malnutrition: Secondary | ICD-10-CM | POA: Diagnosis not present

## 2020-08-27 DIAGNOSIS — R69 Illness, unspecified: Secondary | ICD-10-CM | POA: Diagnosis not present

## 2020-08-27 DIAGNOSIS — E119 Type 2 diabetes mellitus without complications: Secondary | ICD-10-CM | POA: Diagnosis present

## 2020-08-27 DIAGNOSIS — K219 Gastro-esophageal reflux disease without esophagitis: Secondary | ICD-10-CM | POA: Diagnosis present

## 2020-08-27 DIAGNOSIS — J449 Chronic obstructive pulmonary disease, unspecified: Secondary | ICD-10-CM | POA: Diagnosis present

## 2020-08-27 DIAGNOSIS — S069X9A Unspecified intracranial injury with loss of consciousness of unspecified duration, initial encounter: Secondary | ICD-10-CM

## 2020-08-27 DIAGNOSIS — M810 Age-related osteoporosis without current pathological fracture: Secondary | ICD-10-CM | POA: Diagnosis present

## 2020-08-27 DIAGNOSIS — Z419 Encounter for procedure for purposes other than remedying health state, unspecified: Secondary | ICD-10-CM

## 2020-08-27 DIAGNOSIS — U071 COVID-19: Secondary | ICD-10-CM | POA: Diagnosis present

## 2020-08-27 DIAGNOSIS — W19XXXA Unspecified fall, initial encounter: Secondary | ICD-10-CM | POA: Diagnosis not present

## 2020-08-27 DIAGNOSIS — Z1612 Extended spectrum beta lactamase (ESBL) resistance: Secondary | ICD-10-CM | POA: Diagnosis not present

## 2020-08-27 DIAGNOSIS — S066X0A Traumatic subarachnoid hemorrhage without loss of consciousness, initial encounter: Secondary | ICD-10-CM | POA: Diagnosis present

## 2020-08-27 DIAGNOSIS — S72009A Fracture of unspecified part of neck of unspecified femur, initial encounter for closed fracture: Secondary | ICD-10-CM | POA: Diagnosis not present

## 2020-08-27 DIAGNOSIS — S062X9A Diffuse traumatic brain injury with loss of consciousness of unspecified duration, initial encounter: Secondary | ICD-10-CM | POA: Diagnosis not present

## 2020-08-27 DIAGNOSIS — Z9889 Other specified postprocedural states: Secondary | ICD-10-CM

## 2020-08-27 DIAGNOSIS — I2510 Atherosclerotic heart disease of native coronary artery without angina pectoris: Secondary | ICD-10-CM | POA: Diagnosis present

## 2020-08-27 DIAGNOSIS — S72001A Fracture of unspecified part of neck of right femur, initial encounter for closed fracture: Principal | ICD-10-CM

## 2020-08-27 DIAGNOSIS — I252 Old myocardial infarction: Secondary | ICD-10-CM | POA: Diagnosis not present

## 2020-08-27 DIAGNOSIS — Z7982 Long term (current) use of aspirin: Secondary | ICD-10-CM

## 2020-08-27 DIAGNOSIS — Z66 Do not resuscitate: Secondary | ICD-10-CM | POA: Diagnosis not present

## 2020-08-27 DIAGNOSIS — W1830XA Fall on same level, unspecified, initial encounter: Secondary | ICD-10-CM | POA: Diagnosis present

## 2020-08-27 DIAGNOSIS — R279 Unspecified lack of coordination: Secondary | ICD-10-CM | POA: Diagnosis not present

## 2020-08-27 DIAGNOSIS — I10 Essential (primary) hypertension: Secondary | ICD-10-CM | POA: Diagnosis present

## 2020-08-27 DIAGNOSIS — G3183 Dementia with Lewy bodies: Secondary | ICD-10-CM | POA: Diagnosis not present

## 2020-08-27 DIAGNOSIS — S199XXA Unspecified injury of neck, initial encounter: Secondary | ICD-10-CM | POA: Diagnosis not present

## 2020-08-27 DIAGNOSIS — I959 Hypotension, unspecified: Secondary | ICD-10-CM | POA: Diagnosis not present

## 2020-08-27 DIAGNOSIS — E785 Hyperlipidemia, unspecified: Secondary | ICD-10-CM | POA: Diagnosis present

## 2020-08-27 DIAGNOSIS — Z743 Need for continuous supervision: Secondary | ICD-10-CM | POA: Diagnosis not present

## 2020-08-27 DIAGNOSIS — B9620 Unspecified Escherichia coli [E. coli] as the cause of diseases classified elsewhere: Secondary | ICD-10-CM | POA: Diagnosis not present

## 2020-08-27 DIAGNOSIS — Z6823 Body mass index (BMI) 23.0-23.9, adult: Secondary | ICD-10-CM | POA: Diagnosis not present

## 2020-08-27 DIAGNOSIS — Z4789 Encounter for other orthopedic aftercare: Secondary | ICD-10-CM | POA: Diagnosis not present

## 2020-08-27 DIAGNOSIS — I69020 Aphasia following nontraumatic subarachnoid hemorrhage: Secondary | ICD-10-CM | POA: Diagnosis not present

## 2020-08-27 DIAGNOSIS — S0003XA Contusion of scalp, initial encounter: Secondary | ICD-10-CM | POA: Diagnosis not present

## 2020-08-27 DIAGNOSIS — Z8249 Family history of ischemic heart disease and other diseases of the circulatory system: Secondary | ICD-10-CM

## 2020-08-27 DIAGNOSIS — Z20822 Contact with and (suspected) exposure to covid-19: Secondary | ICD-10-CM | POA: Diagnosis not present

## 2020-08-27 DIAGNOSIS — Y92129 Unspecified place in nursing home as the place of occurrence of the external cause: Secondary | ICD-10-CM

## 2020-08-27 DIAGNOSIS — Z96698 Presence of other orthopedic joint implants: Secondary | ICD-10-CM | POA: Diagnosis not present

## 2020-08-27 DIAGNOSIS — Z79899 Other long term (current) drug therapy: Secondary | ICD-10-CM

## 2020-08-27 DIAGNOSIS — Z8781 Personal history of (healed) traumatic fracture: Secondary | ICD-10-CM

## 2020-08-27 DIAGNOSIS — F0281 Dementia in other diseases classified elsewhere with behavioral disturbance: Secondary | ICD-10-CM | POA: Diagnosis not present

## 2020-08-27 LAB — CBC WITH DIFFERENTIAL/PLATELET
Abs Immature Granulocytes: 0.08 10*3/uL — ABNORMAL HIGH (ref 0.00–0.07)
Basophils Absolute: 0.1 10*3/uL (ref 0.0–0.1)
Basophils Relative: 0 %
Eosinophils Absolute: 0.1 10*3/uL (ref 0.0–0.5)
Eosinophils Relative: 1 %
HCT: 43.5 % (ref 36.0–46.0)
Hemoglobin: 14.1 g/dL (ref 12.0–15.0)
Immature Granulocytes: 1 %
Lymphocytes Relative: 15 %
Lymphs Abs: 2.1 10*3/uL (ref 0.7–4.0)
MCH: 29.2 pg (ref 26.0–34.0)
MCHC: 32.4 g/dL (ref 30.0–36.0)
MCV: 90.1 fL (ref 80.0–100.0)
Monocytes Absolute: 0.5 10*3/uL (ref 0.1–1.0)
Monocytes Relative: 4 %
Neutro Abs: 10.8 10*3/uL — ABNORMAL HIGH (ref 1.7–7.7)
Neutrophils Relative %: 79 %
Platelets: 221 10*3/uL (ref 150–400)
RBC: 4.83 MIL/uL (ref 3.87–5.11)
RDW: 12.7 % (ref 11.5–15.5)
WBC: 13.7 10*3/uL — ABNORMAL HIGH (ref 4.0–10.5)
nRBC: 0 % (ref 0.0–0.2)

## 2020-08-27 LAB — SARS CORONAVIRUS 2 BY RT PCR (HOSPITAL ORDER, PERFORMED IN ~~LOC~~ HOSPITAL LAB): SARS Coronavirus 2: POSITIVE — AB

## 2020-08-27 LAB — BASIC METABOLIC PANEL
Anion gap: 9 (ref 5–15)
BUN: 14 mg/dL (ref 8–23)
CO2: 30 mmol/L (ref 22–32)
Calcium: 9.4 mg/dL (ref 8.9–10.3)
Chloride: 105 mmol/L (ref 98–111)
Creatinine, Ser: 0.69 mg/dL (ref 0.44–1.00)
GFR, Estimated: >60 mL/min (ref >60)
Glucose, Bld: 117 mg/dL — ABNORMAL HIGH (ref 70–99)
Potassium: 3.6 mmol/L (ref 3.5–5.1)
Sodium: 144 mmol/L (ref 135–145)

## 2020-08-27 LAB — PROTIME-INR
INR: 1 (ref 0.8–1.2)
Prothrombin Time: 12.5 seconds (ref 11.4–15.2)

## 2020-08-27 MED ORDER — ACETAMINOPHEN 325 MG PO TABS: 650.0000 mg | ORAL_TABLET | Freq: Four times a day (QID) | ORAL | Status: DC | PRN

## 2020-08-27 MED ORDER — BISACODYL 10 MG RE SUPP: 10.0000 mg | Freq: Every day | RECTAL | Status: DC | PRN

## 2020-08-27 MED ORDER — CHLORHEXIDINE GLUCONATE 4 % EX LIQD
60.0000 mL | Freq: Once | CUTANEOUS | Status: DC
  Filled 2020-08-27: qty 60

## 2020-08-27 MED ORDER — POLYETHYLENE GLYCOL 3350 17 G PO PACK: 17.0000 g | PACK | Freq: Every day | ORAL | Status: DC | PRN

## 2020-08-27 MED ORDER — OXYCODONE HCL 5 MG PO TABS: 5.0000 mg | ORAL_TABLET | ORAL | Status: DC | PRN

## 2020-08-27 MED ORDER — POVIDONE-IODINE 10 % EX SWAB: 2.0000 "application " | Freq: Once | CUTANEOUS | Status: DC

## 2020-08-27 MED ORDER — CEFAZOLIN SODIUM-DEXTROSE 2-4 GM/100ML-% IV SOLN: 2.0000 g | INTRAVENOUS | Status: DC

## 2020-08-27 MED ORDER — HYDROMORPHONE HCL 1 MG/ML IJ SOLN: 0.5000 mg | INTRAMUSCULAR | Status: DC | PRN

## 2020-08-27 MED ORDER — MORPHINE SULFATE (PF) 2 MG/ML IV SOLN
2.0000 mg | Freq: Once | INTRAVENOUS | Status: AC
Start: 2020-08-27 — End: 2020-08-27
  Administered 2020-08-27: 2 mg via INTRAVENOUS
  Filled 2020-08-27: qty 1

## 2020-08-27 MED ORDER — ATORVASTATIN CALCIUM 40 MG PO TABS
80.0000 mg | ORAL_TABLET | Freq: Every day | ORAL | Status: DC
Start: 2020-08-28 — End: 2020-09-04
  Administered 2020-08-29 – 2020-09-03 (×6): 80 mg via ORAL
  Filled 2020-08-27 (×7): qty 2

## 2020-08-27 MED ORDER — DOCUSATE SODIUM 100 MG PO CAPS
100.0000 mg | ORAL_CAPSULE | Freq: Two times a day (BID) | ORAL | Status: DC
  Administered 2020-08-27 – 2020-09-04 (×14): 100 mg via ORAL
  Filled 2020-08-27 (×15): qty 1

## 2020-08-27 MED ORDER — CHLORHEXIDINE GLUCONATE 4 % EX LIQD
60.0000 mL | Freq: Once | CUTANEOUS | Status: AC
  Administered 2020-08-28: 4 via TOPICAL

## 2020-08-27 MED ORDER — METOPROLOL TARTRATE 12.5 MG HALF TABLET
12.5000 mg | ORAL_TABLET | Freq: Two times a day (BID) | ORAL | Status: DC
  Administered 2020-08-27 – 2020-09-04 (×14): 12.5 mg via ORAL
  Filled 2020-08-27 (×15): qty 1

## 2020-08-27 NOTE — Consult Note (Signed)
ORTHOPAEDIC CONSULTATION  REQUESTING PHYSICIAN: Alberteen Sam, *  Chief Complaint: Right hip pain  HPI: Tiffany Mack is a 80 y.o. female who complains of acute right hip pain after a fall. She lives in a memory care unit. Pain is acute, moderate to severe, worse with movement, better with rest. Located around the right hip. Denies any other injuries although she did hit her head. She is not a very good historian. Son is at the bedside.  Past Medical History:  Diagnosis Date  . Anxiety   . Coronary artery disease   . COVID-19 07/26/2019  . Dementia [facility])    Past Surgical History:  Procedure Laterality Date  . CARDIAC CATHETERIZATION N/A 08/04/2015   Procedure: Left Heart Cath and Coronary Angiography;  Surgeon: Rinaldo Cloud, MD;  Location: [facility] INVASIVE CV LAB;  Service: Cardiovascular;  Laterality: N/A;  . CARDIAC CATHETERIZATION N/A 08/08/2015   Procedure: Coronary Stent Intervention;  Surgeon: Rinaldo Cloud, MD;  Location: [facility] INVASIVE CV LAB;  Service: Cardiovascular;  Laterality: N/A;   Social History   Socioeconomic History  . Marital status: Single    Spouse name: Not on file  . Number of children: Not on file  . Years of education: Not on file  . Highest education level: Not on file  Occupational History  . Not on file  Tobacco Use  . Smoking status: Never Smoker  . Smokeless tobacco: Never Used  Vaping Use  . Vaping Use: Never used  Substance and Sexual Activity  . Alcohol use: No  . Drug use: No  . Sexual activity: Not Currently  Other Topics Concern  . Not on file  Social History Narrative  . Not on file   Social Determinants of Health   Financial Resource Strain: Not on file  Food Insecurity: Not on file  Transportation Needs: Not on file  Physical Activity: Not on file  Stress: Not on file  Social Connections: Not on file   Family History  Problem Relation Age of Onset  . Heart disease Mother    No Known Allergies   Positive  ROS: All other systems have been reviewed and were otherwise negative with the exception of those mentioned in the HPI and as above.  Physical Exam: BP (!) 159/60   Pulse 85   Temp 98 F (36.7 C) (Oral)   Resp 19   SpO2 95%   General: Alert, no acute distress, patient mildly confused, at her baseline according to the son. Cardiovascular: No pedal edema Respiratory: No cyanosis, no use of accessory musculature GI: No organomegaly, abdomen is soft and non-tender Skin: No lesions in the area of chief complaint, with the exception of some slight bruising. Neurologic: Sensation intact distally Psychiatric: Patient is not competent for consent, but interacts normally, and son is at the bedside. Lymphatic: No axillary or cervical lymphadenopathy  MUSCULOSKELETAL: She has a moderate hematoma over the right frontal orbit. She is able to do a straight leg raise on the left side, both upper extremities are atraumatic, the right lower extremity is unable to do a straight leg raise. EHL and FHL are intact.  Assessment: Principal Problem:   Hip fracture (HCC) Active Problems:   HTN (hypertension)   Dementia (HCC)   CAD (coronary artery disease)   Malnutrition of moderate degree   Subarachnoid hemorrhage (HCC)   Plan: This is an acute severe injury, and carries risk both for morbidity and mortality over the near and short-term. I recommended surgical intervention with percutaneous  pinning of the right hip. We are going to monitor her overnight given her subarachnoid hemorrhage, and plan for surgery tomorrow. I discussed the case at length with Dr. Maryfrances Bunnell, and appreciate his excellent care, he has helped coordinate with the neurosurgical team, who have indicated that she should be okay for Lovenox postoperatively, 24 hours after her injury. If she develops any worsening neurologic findings overnight then we may consider repeat CAT scan of her head per the neurosurgical team.  In the meantime, she  will be n.p.o. after midnight tonight, and plan for surgery tomorrow early afternoon, she did test positive for Covid, although seems to be asymptomatic, but we will take all of the standard Covid precautions.  The risks benefits and alternatives were discussed with the patient and family including but not limited to the risks of nonoperative treatment, versus surgical intervention including infection, bleeding, nerve injury, malunion, nonunion, AVN, the need for revision surgery, hardware prominence, hardware failure, the need for hardware removal, blood clots, cardiopulmonary complications, morbidity, mortality, among others, and they were willing to proceed.       Tiffany Post, MD Cell [phone]   08/27/2020 7:15 PM

## 2020-08-27 NOTE — ED Triage Notes (Signed)
Pt BIB EMS from [facility] in [place]. Facility c/o fall. Pt was bending over to put shoes on, fell. Pt does not remember the fall, unwitnessed fall. Pt has dementia. Pt does not take blood thinners. Pt hit head, hematoma to right side of head. Pt denies neck pain, denies back pain.   CBG 130

## 2020-08-27 NOTE — ED Provider Notes (Signed)
[facility]-EMERGENCY DEPT Provider Note   CSN: [id number] Arrival date & time: 08/27/20  1155     History Chief Complaint  Patient presents with  . Fall    Tiffany Mack is a 80 y.o. female.  HPI   Patient presents to the ED from [facility], skilled nursing facility.  Patient apparently was bending over to put on her shoes and fell.  Staff however did not witness this.  Patient does not able to describe the details of what happened at this point.  Patient was noted to have significant hematoma on her right head.  Patient denies any trouble with any neck pain or back pain.  She denies any focal numbness or weakness.  She denies any other injuries.  Past Medical History:  Diagnosis Date  . Anxiety   . Coronary artery disease   . COVID-19 07/26/2019  . Dementia [facility])     Patient Active Problem List   Diagnosis Date Noted  . Pneumonia due to COVID-19 virus 07/28/2019  . Acute cystitis without hematuria   . Acute encephalopathy 07/26/2019  . Pressure injury of skin 07/26/2019  . CAP (community acquired pneumonia) 07/26/2019  . Hypokalemia 07/26/2019  . Acute lower UTI 07/26/2019  . Depression 07/26/2019  . Malnutrition of moderate degree 03/24/2018  . 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 Vomiting 03/21/2018  . HTN (hypertension) 03/21/2018  . Dementia (HCC) 03/21/2018  . CAD (coronary artery disease) 03/21/2018  . Acute non Q wave MI (myocardial infarction), initial episode of care [facility]) 08/04/2015    Past Surgical History:  Procedure Laterality Date  . CARDIAC CATHETERIZATION N/A 08/04/2015   Procedure: Left Heart Cath and Coronary Angiography;  Surgeon: Rinaldo Cloud, MD;  Location: [facility] INVASIVE CV LAB;  Service: Cardiovascular;  Laterality: N/A;  . CARDIAC CATHETERIZATION N/A 08/08/2015   Procedure: Coronary Stent Intervention;  Surgeon: Rinaldo Cloud, MD;  Location: [facility] INVASIVE CV LAB;  Service: Cardiovascular;  Laterality: N/A;     OB  History   No obstetric history on file.     History reviewed. No pertinent family history.  Social History   Tobacco Use  . Smoking status: Never Smoker  . Smokeless tobacco: Never Used  Vaping Use  . Vaping Use: Never used  Substance Use Topics  . Alcohol use: No  . Drug use: No    Home Medications Prior to Admission medications   Medication Sig Start Date End Date Taking? Authorizing Provider  Ascorbic Acid (VITAMIN C) 500 MG PACK Take 1 tablet by mouth 2 (two) times daily.    [provider]  aspirin EC 81 MG EC tablet Take 1 tablet (81 mg total) by mouth daily. 08/11/15   Rinaldo Cloud, MD  atorvastatin (LIPITOR) 80 MG tablet Take 1 tablet (80 mg total) by mouth daily at 6 PM. 08/11/15   Rinaldo Cloud, MD  cephALEXin (KEFLEX) 500 MG capsule Take 500 mg by mouth 3 (three) times daily. For 7 days, would have ended 04/30/20 04/24/20   [provider]  LORazepam (ATIVAN) 0.5 MG tablet Take 0.5 mg by mouth 3 (three) times daily as needed. 04/12/20   [provider]  memantine (NAMENDA) 10 MG tablet Take 1 tablet every night for 2 weeks, then increase to 1 tablet twice a day Patient taking differently: Take 10 mg by mouth at bedtime.  05/26/19   Van Clines, MD  metoprolol tartrate (LOPRESSOR) 25 MG tablet Take 0.5 tablets (12.5 mg total) by mouth 2 (two) times daily.  08/11/15   Rinaldo Cloud, MD  miconazole (MICOTIN) 2 % cream Apply 1 application topically 2 (two) times daily.    [provider]  mirtazapine (REMERON) 30 MG tablet Take 30 mg by mouth at bedtime.  10/08/18   [provider]  multivitamin-iron-minerals-folic acid (THERAPEUTIC-M) TABS tablet Take 1 tablet by mouth daily.    [provider]  nitroGLYCERIN (NITROSTAT) 0.4 MG SL tablet Place 1 tablet (0.4 mg total) under the tongue every 5 (five) minutes as needed for chest pain (CP or SOB). 08/11/15   Rinaldo Cloud, MD  ondansetron (ZOFRAN) 8 MG tablet Take 8 mg by  mouth every 8 (eight) hours as needed for nausea/vomiting. 03/17/18   [provider]  pantoprazole (PROTONIX) 40 MG tablet Take 1 tablet (40 mg total) by mouth daily at 6 (six) AM. 08/11/15   Rinaldo Cloud, MD  rivastigmine (EXELON) 13.3 MG/24HR Place 13.3 mg onto the skin daily.  06/22/19   [provider]  Sunscreens (COPPERTONE SPORT SPF 30 EX) Apply 1 application topically every 2 (two) hours as needed. While in the sun    [provider]  venlafaxine XR (EFFEXOR-XR) 37.5 MG 24 hr capsule Take 37.5 mg by mouth daily with breakfast.    [provider]  ZINC OXIDE, TOPICAL, (SECURA PROTECTIVE) 10 % CREA Apply 1 application topically 2 (two) times daily as needed.    [provider]    Allergies    Patient has no known allergies.  Review of Systems   Review of Systems  All other systems reviewed and are negative.   Physical Exam Updated Vital Signs BP (!) 166/62 (BP Location: Left Arm)   Pulse 75   Temp 98 F (36.7 C) (Oral)   Resp 18   SpO2 100%   Physical Exam Vitals and nursing note reviewed.  Constitutional:      General: She is not in acute distress.    Appearance: She is well-developed and well-nourished.  HENT:     Head: Normocephalic.     Comments: Large dense hematoma right temporal region, small skin abrasion no laceration    Right Ear: External ear normal.     Left Ear: External ear normal.  Eyes:     General: No scleral icterus.       Right eye: No discharge.        Left eye: No discharge.     Conjunctiva/sclera: Conjunctivae normal.  Neck:     Trachea: No tracheal deviation.  Cardiovascular:     Rate and Rhythm: Normal rate and regular rhythm.     Pulses: Intact distal pulses.  Pulmonary:     Effort: Pulmonary effort is normal. No respiratory distress.     Breath sounds: Normal breath sounds. No stridor. No wheezing or rales.  Abdominal:     General: Bowel sounds are normal. There is no distension.      Palpations: Abdomen is soft.     Tenderness: There is no abdominal tenderness. There is no guarding or rebound.  Musculoskeletal:        General: No tenderness or edema.     Cervical back: Neck supple.     Comments: No tender cervical thoracic or lumbar spine, no tenderness to palpation or with range of motion of the bilateral upper or lower extremities  Skin:    General: Skin is warm and dry.     Findings: No rash.  Neurological:     Mental Status: She is alert.  Cranial Nerves: No cranial nerve deficit (no facial droop, extraocular movements intact, no slurred speech).     Sensory: No sensory deficit.     Motor: Weakness present. No abnormal muscle tone or seizure activity.     Coordination: Coordination normal.     Deep Tendon Reflexes: Strength normal.     Comments: Patient has more difficulty lifting her right leg off the bed compared to the left, no difficulty lifting either arm off the bed  Psychiatric:        Mood and Affect: Mood and affect normal.     ED Results / Procedures / Treatments   Labs (all labs ordered are listed, but only abnormal results are displayed) Labs Reviewed  CBC WITH DIFFERENTIAL/PLATELET - Abnormal; Notable for the following components:      Result Value   WBC 13.7 (*)    Neutro Abs 10.8 (*)    Abs Immature Granulocytes 0.08 (*)    All other components within normal limits  PROTIME-INR  BASIC METABOLIC PANEL  URINALYSIS, ROUTINE W REFLEX MICROSCOPIC    EKG None  Radiology CT Head Wo Contrast  Result Date: 08/27/2020 CLINICAL DATA:  Right scalp hematoma following a fall. EXAM: CT HEAD WITHOUT CONTRAST CT CERVICAL SPINE WITHOUT CONTRAST TECHNIQUE: Multidetector CT imaging of the head and cervical spine was performed following the standard protocol without intravenous contrast. Multiplanar CT image reconstructions of the cervical spine were also generated. COMPARISON:  05/10/2020 FINDINGS: CT HEAD FINDINGS Brain: Multiple areas of parenchymal  and subarachnoid hemorrhage in the right frontal region. No intraventricular hemorrhage is seen. No mass effect. Stable moderately enlarged ventricles and subarachnoid spaces. Stable moderate patchy white matter low density in both cerebral hemispheres. No intracranial mass or CT evidence of acute infarction. Vascular: No hyperdense vessel or unexpected calcification. Skull: Normal. Negative for fracture or focal lesion. Sinuses/Orbits: Status post bilateral cataract extraction. Unremarkable bones and included paranasal sinuses. Other: Large right lateral frontal scalp hematoma. Deviation of the midportion of the nasal septum to the right. CT CERVICAL SPINE FINDINGS Alignment: Normal. Skull base and vertebrae: No acute fracture. No primary bone lesion or focal pathologic process. Soft tissues and spinal canal: No prevertebral fluid or swelling. No visible canal hematoma. Disc levels: Previously demonstrated multilevel degenerative changes. Upper chest: Biapical parenchymal scarring. Other: Bilateral carotid artery calcifications. IMPRESSION: 1. Multiple areas of parenchymal and subarachnoid hemorrhage in the right frontal region. 2. Large right lateral frontal scalp hematoma without skull fracture. 3. No cervical spine fracture or subluxation. 4. Stable moderate diffuse cerebral and cerebellar atrophy and moderate chronic small vessel white matter ischemic changes in both cerebral hemispheres. 5. Previously demonstrated multilevel cervical spine degenerative changes. 6. Bilateral carotid artery atheromatous calcifications. Critical Value/emergent results were called by telephone at the time of interpretation on 08/27/2020 at 2:09 pm to provider [facility] , who verbally acknowledged these results. Electronically Signed   By: Beckie Salts M.D.   On: 08/27/2020 14:13   CT Cervical Spine Wo Contrast  Result Date: 08/27/2020 CLINICAL DATA:  Right scalp hematoma following a fall. EXAM: CT HEAD WITHOUT CONTRAST CT  CERVICAL SPINE WITHOUT CONTRAST TECHNIQUE: Multidetector CT imaging of the head and cervical spine was performed following the standard protocol without intravenous contrast. Multiplanar CT image reconstructions of the cervical spine were also generated. COMPARISON:  05/10/2020 FINDINGS: CT HEAD FINDINGS Brain: Multiple areas of parenchymal and subarachnoid hemorrhage in the right frontal region. No intraventricular hemorrhage is seen. No mass effect. Stable moderately enlarged ventricles and  subarachnoid spaces. Stable moderate patchy white matter low density in both cerebral hemispheres. No intracranial mass or CT evidence of acute infarction. Vascular: No hyperdense vessel or unexpected calcification. Skull: Normal. Negative for fracture or focal lesion. Sinuses/Orbits: Status post bilateral cataract extraction. Unremarkable bones and included paranasal sinuses. Other: Large right lateral frontal scalp hematoma. Deviation of the midportion of the nasal septum to the right. CT CERVICAL SPINE FINDINGS Alignment: Normal. Skull base and vertebrae: No acute fracture. No primary bone lesion or focal pathologic process. Soft tissues and spinal canal: No prevertebral fluid or swelling. No visible canal hematoma. Disc levels: Previously demonstrated multilevel degenerative changes. Upper chest: Biapical parenchymal scarring. Other: Bilateral carotid artery calcifications. IMPRESSION: 1. Multiple areas of parenchymal and subarachnoid hemorrhage in the right frontal region. 2. Large right lateral frontal scalp hematoma without skull fracture. 3. No cervical spine fracture or subluxation. 4. Stable moderate diffuse cerebral and cerebellar atrophy and moderate chronic small vessel white matter ischemic changes in both cerebral hemispheres. 5. Previously demonstrated multilevel cervical spine degenerative changes. 6. Bilateral carotid artery atheromatous calcifications. Critical Value/emergent results were called by telephone  at the time of interpretation on 08/27/2020 at 2:09 pm to provider [facility] , who verbally acknowledged these results. Electronically Signed   By: Beckie Salts M.D.   On: 08/27/2020 14:13   DG Hip Unilat W or Wo Pelvis 2-3 Views Right  Result Date: 08/27/2020 CLINICAL DATA:  Difficulty raising the right leg following a fall. EXAM: DG HIP (WITH OR WITHOUT PELVIS) 2-3V RIGHT COMPARISON:  None. FINDINGS: Essentially nondisplaced right femoral neck fracture with no significant angulation. Lower lumbar spine degenerative changes. IMPRESSION: Essentially nondisplaced right femoral neck fracture. Electronically Signed   By: Beckie Salts M.D.   On: 08/27/2020 13:40    Procedures .Critical Care Performed by: Linwood Dibbles, MD Authorized by: Linwood Dibbles, MD   Critical care provider statement:    Critical care time (minutes):  30   Critical care was time spent personally by me on the following activities:  Discussions with consultants, evaluation of patient's response to treatment, examination of patient, ordering and performing treatments and interventions, ordering and review of laboratory studies, ordering and review of radiographic studies, pulse oximetry, re-evaluation of patient's condition, obtaining history from patient or surrogate and review of old charts     Medications Ordered in ED Medications - No data to display  ED Course  I have reviewed the triage vital signs and the nursing notes.  Pertinent labs & imaging results that were available during my care of the patient were reviewed by me and considered in my medical decision making (see chart for details).  Clinical Course as of 08/27/20 1539  Sun Aug 27, 2020  1434 Discussed case with Dr. Johnsie Cancel.  Son states is hard to say if the patient is at her baseline.  She does seem more unsteady than usual.  Dr. Maurice Small recommends overnight observation. [JK]    Clinical Course User Index [JK] Linwood Dibbles, MD   MDM Rules/Calculators/A&P                           Patient presented to the ED for evaluation after fall.  Patient had significant scalp hematoma on exam.  Head CT was performed.  Head CT is does show evidence of subarachnoid hemorrhage as well as intraparenchymal hemorrhage.  Patient's son states patient does seem a little bit more unsteady and possibly more confused than usual.  Is not on anticoagulation.  We will plan on admission for observation.  Case was discussed with neurosurgery Dr. Johnsie Cancelstergaard.  No indication for any intervention at this time.    Hip xray also shows non displaced fx of her hip.  Will need ortho consult.  Labs are still pending.  Dr Rodena MedinMessick will follow up on labs.  Consult with ortho and hospitalist for admission. Final Clinical Impression(s) / ED Diagnoses Final diagnoses:  Subarachnoid bleed (HCC)  Traumatic brain injury with loss of consciousness, initial encounter (HCC)  Hematoma of scalp, initial encounter  Closed fracture of right hip, initial encounter [facility](HCC)    Rx / DC Orders ED Discharge Orders    None       Linwood DibblesKnapp, Libbie Bartley, MD 08/27/20 1539

## 2020-08-27 NOTE — Progress Notes (Signed)
Pt fell at nursing home, discussed w/ Dr. Lynelle Doctor, clinically doing well aside from the scalp hematoma but some concern by family about residual unsteadiness. CTH reviewed, shows large scalp hematoma, right middle frontal / superior frontal tSAH. Pt is reportedly not on any anticoagulants or anti-platelet medications and is low risk for requiring an operative intervention. Would recommend overnight observation if there is any concern about her not being back to baseline yet. No need for repeat CTH if patient clinically does well, but obviously would recommend repeating the CT if the patient has a significant change in her neurologic status. Please call with any questions or concerns.

## 2020-08-27 NOTE — ED Notes (Signed)
This RN unable to draw labs/start IV. This RN has asked other team members for assistance.

## 2020-08-27 NOTE — H&P (Signed)
History and Physical  Patient Name: Tiffany Mack     WIO:[id number]    DOB: 02-05-1941    DOA: 08/27/2020  PCP: Mila Palmer, MD   Patient coming from: [facility]      Chief Complaint: Hip pain and fall  HPI: Tiffany Mack is a 80 y.o.  F with dementia, lives in memory care, CAD s/p PCI 2017, and HTN who presents with hip pain and head bruise after a fall.  The patient was in her normal state of health until this afternoon when she was tying her shoes, fell forward out of her chair onto her head.    In the ED, the patient was at baseline but CT head showed right frontal intraparenchyhmal and subarachnoid hemorrhage without mass effectt and a plain radiograph of the RIGHT hip showed a femoral neck fracture.  The case was discussed with Dr. Maurice Small from Neurosurgery and Dr. Dion Saucier from Orthopedics, and [facility] were asked to admit for medical management.    Found to have incidental Covid in the ER.     Review of Systems:  Review of Systems  Constitutional: Negative for chills, fever and malaise/fatigue.  Respiratory: Negative for cough and shortness of breath.   Cardiovascular: Negative for chest pain, palpitations, orthopnea, claudication, leg swelling and PND.  Neurological: Negative for speech change, focal weakness, seizures, loss of consciousness, weakness and headaches.  All other systems reviewed and are negative.         Past Medical History:  Diagnosis Date  . Anxiety   . Coronary artery disease   . COVID-19 07/26/2019  . Dementia [facility])     Past Surgical History:  Procedure Laterality Date  . CARDIAC CATHETERIZATION N/A 08/04/2015   Procedure: Left Heart Cath and Coronary Angiography;  Surgeon: Rinaldo Cloud, MD;  Location: [facility] INVASIVE CV LAB;  Service: Cardiovascular;  Laterality: N/A;  . CARDIAC CATHETERIZATION N/A 08/08/2015   Procedure: Coronary Stent Intervention;  Surgeon: Rinaldo Cloud, MD;  Location: [facility] INVASIVE CV LAB;  Service:  Cardiovascular;  Laterality: N/A;    Social History: Patient lives in memory care.  Patient walks unassisted.  Non-smoker.  POA is her son.   No Known Allergies  Family history: family history includes Heart disease in her mother.  Also cancer in an unspecified family member.  Prior to Admission medications   Medication Sig Start Date End Date Taking? Authorizing Provider  Ascorbic Acid (VITAMIN C) 500 MG PACK Take 1 tablet by mouth 2 (two) times daily.    [provider]  aspirin EC 81 MG EC tablet Take 1 tablet (81 mg total) by mouth daily. 08/11/15   Rinaldo Cloud, MD  atorvastatin (LIPITOR) 80 MG tablet Take 1 tablet (80 mg total) by mouth daily at 6 PM. 08/11/15   Rinaldo Cloud, MD  cephALEXin (KEFLEX) 500 MG capsule Take 500 mg by mouth 3 (three) times daily. For 7 days, would have ended 04/30/20 04/24/20   [provider]  LORazepam (ATIVAN) 0.5 MG tablet Take 0.5 mg by mouth 3 (three) times daily as needed. 04/12/20   [provider]  memantine (NAMENDA) 10 MG tablet Take 1 tablet every night for 2 weeks, then increase to 1 tablet twice a day Patient taking differently: Take 10 mg by mouth at bedtime.  05/26/19   Van Clines, MD  metoprolol tartrate (LOPRESSOR) 25 MG tablet Take 0.5 tablets (12.5 mg total) by mouth 2 (two) times daily. 08/11/15   Rinaldo Cloud, MD  miconazole (MICOTIN) 2 %  cream Apply 1 application topically 2 (two) times daily.    [provider]  mirtazapine (REMERON) 30 MG tablet Take 30 mg by mouth at bedtime.  10/08/18   [provider]  multivitamin-iron-minerals-folic acid (THERAPEUTIC-M) TABS tablet Take 1 tablet by mouth daily.    [provider]  nitroGLYCERIN (NITROSTAT) 0.4 MG SL tablet Place 1 tablet (0.4 mg total) under the tongue every 5 (five) minutes as needed for chest pain (CP or SOB). 08/11/15   [name], Mohan, MD  ondansetron (ZOFRAN) 8 MG tablet Take 8 mg by mouth every 8 (eight) hours as  needed for nausea/vomiting. 03/17/18   [provider]  pantoprazole (PROTONIX) 40 MG tablet Take 1 tablet (40 mg total) by mouth daily at 6 (six) AM. 08/11/15   [name], Mohan, MD  rivastigmine (EXELON) 13.3 MG/24HR Place 13.3 mg onto the skin daily.  06/22/19   [provider]  Sunscreens (COPPERTONE SPORT SPF 30 EX) Apply 1 application topically every 2 (two) hours as needed. While in the sun    [provider]  venlafaxine XR (EFFEXOR-XR) 37.5 MG 24 hr capsule Take 37.5 mg by mouth daily with breakfast.    [provider]  ZINC OXIDE, TOPICAL, (SECURA PROTECTIVE) 10 % CREA Apply 1 application topically 2 (two) times daily as needed.    [provider]       Physical Exam: BP (!) 159/60   Pulse 85   Temp 98 F (36.7 C) (Oral)   Resp 19   SpO2 95%  General appearance: Well-developed, elderly adult female, alert and in no obvious distress, lying in bed.   Eyes: Anicteric, conjunctiva pink, lids and lashes normal. PERRL.    ENT: No nasal deformity, discharge, epistaxis.  Hearing normal. OP moist without lesions.  Dentures in place lips normal.   There is a large hematoma on the right forehead Skin: Warm and dry.  No jaundice.  No suspicious rashes or lesions. Cardiac: RRR, nl S1-S2, no murmurs appreciated.  Capillary refill is brisk.  JVP normal  No LE edema.  Radial pulses 2+ and symmetric.  No carotid bruits. Respiratory: Normal respiratory rate and rhythm.  CTAB without rales or wheezes. GI: Abdomen soft without rigidity.  no TTP. No ascites, distension, no hepatosplenomegaly.  MSK: No deformities on my exam, pain limits movement of the right hip.  No effusions.  No clubbing or cyanosis. Neuro: Cranial nerves III through XII intact sensorium intact and responding to questions, attention normal.  Speech is fluent.  Moves upper extremities equally and with normal coordination.    Psych: The patient is oriented to time, place and person.  Behavior  appropriate.  Affect pleasant.  Memory seems impaired, she is oriented to self only.  Not to place, year, or situation.  She is Estate agentpleasantly interactive.      Labs on Admission:  I have personally reviewed following labs and imaging studies: CBC: Recent Labs  Lab 08/27/20 1503  WBC 13.7*  NEUTROABS 10.8*  HGB 14.1  HCT 43.5  MCV 90.1  PLT 221   Basic Metabolic Panel: Recent Labs  Lab 08/27/20 1503  NA 144  K 3.6  CL 105  CO2 30  GLUCOSE 117*  BUN 14  CREATININE 0.69  CALCIUM 9.4   GFR: CrCl cannot be calculated (Unknown ideal weight.).  Coagulation Profile: Recent Labs  Lab 08/27/20 1503  INR 1.0     Radiological Exams on Admission: Personally reviewed CT head report which shows multiple areas of  parenchymal and subarachnoid hemorrhage of the right frontal region, hematoma, no other acute findings: CT Head Wo Contrast  Result Date: 08/27/2020 CLINICAL DATA:  Right scalp hematoma following a fall. EXAM: CT HEAD WITHOUT CONTRAST CT CERVICAL SPINE WITHOUT CONTRAST TECHNIQUE: Multidetector CT imaging of the head and cervical spine was performed following the standard protocol without intravenous contrast. Multiplanar CT image reconstructions of the cervical spine were also generated. COMPARISON:  05/10/2020 FINDINGS: CT HEAD FINDINGS Brain: Multiple areas of parenchymal and subarachnoid hemorrhage in the right frontal region. No intraventricular hemorrhage is seen. No mass effect. Stable moderately enlarged ventricles and subarachnoid spaces. Stable moderate patchy white matter low density in both cerebral hemispheres. No intracranial mass or CT evidence of acute infarction. Vascular: No hyperdense vessel or unexpected calcification. Skull: Normal. Negative for fracture or focal lesion. Sinuses/Orbits: Status post bilateral cataract extraction. Unremarkable bones and included paranasal sinuses. Other: Large right lateral frontal scalp hematoma. Deviation of the midportion of the  nasal septum to the right. CT CERVICAL SPINE FINDINGS Alignment: Normal. Skull base and vertebrae: No acute fracture. No primary bone lesion or focal pathologic process. Soft tissues and spinal canal: No prevertebral fluid or swelling. No visible canal hematoma. Disc levels: Previously demonstrated multilevel degenerative changes. Upper chest: Biapical parenchymal scarring. Other: Bilateral carotid artery calcifications. IMPRESSION: 1. Multiple areas of parenchymal and subarachnoid hemorrhage in the right frontal region. 2. Large right lateral frontal scalp hematoma without skull fracture. 3. No cervical spine fracture or subluxation. 4. Stable moderate diffuse cerebral and cerebellar atrophy and moderate chronic small vessel white matter ischemic changes in both cerebral hemispheres. 5. Previously demonstrated multilevel cervical spine degenerative changes. 6. Bilateral carotid artery atheromatous calcifications. Critical Value/emergent results were called by telephone at the time of interpretation on 08/27/2020 at 2:09 pm to provider [facility] , who verbally acknowledged these results. Electronically Signed   By: Beckie Salts M.D.   On: 08/27/2020 14:13   CT Cervical Spine Wo Contrast  Result Date: 08/27/2020 CLINICAL DATA:  Right scalp hematoma following a fall. EXAM: CT HEAD WITHOUT CONTRAST CT CERVICAL SPINE WITHOUT CONTRAST TECHNIQUE: Multidetector CT imaging of the head and cervical spine was performed following the standard protocol without intravenous contrast. Multiplanar CT image reconstructions of the cervical spine were also generated. COMPARISON:  05/10/2020 FINDINGS: CT HEAD FINDINGS Brain: Multiple areas of parenchymal and subarachnoid hemorrhage in the right frontal region. No intraventricular hemorrhage is seen. No mass effect. Stable moderately enlarged ventricles and subarachnoid spaces. Stable moderate patchy white matter low density in both cerebral hemispheres. No intracranial mass or CT  evidence of acute infarction. Vascular: No hyperdense vessel or unexpected calcification. Skull: Normal. Negative for fracture or focal lesion. Sinuses/Orbits: Status post bilateral cataract extraction. Unremarkable bones and included paranasal sinuses. Other: Large right lateral frontal scalp hematoma. Deviation of the midportion of the nasal septum to the right. CT CERVICAL SPINE FINDINGS Alignment: Normal. Skull base and vertebrae: No acute fracture. No primary bone lesion or focal pathologic process. Soft tissues and spinal canal: No prevertebral fluid or swelling. No visible canal hematoma. Disc levels: Previously demonstrated multilevel degenerative changes. Upper chest: Biapical parenchymal scarring. Other: Bilateral carotid artery calcifications. IMPRESSION: 1. Multiple areas of parenchymal and subarachnoid hemorrhage in the right frontal region. 2. Large right lateral frontal scalp hematoma without skull fracture. 3. No cervical spine fracture or subluxation. 4. Stable moderate diffuse cerebral and cerebellar atrophy and moderate chronic small vessel white matter ischemic changes in both cerebral hemispheres. 5. Previously demonstrated multilevel  cervical spine degenerative changes. 6. Bilateral carotid artery atheromatous calcifications. Critical Value/emergent results were called by telephone at the time of interpretation on 08/27/2020 at 2:09 pm to provider [facility] , who verbally acknowledged these results. Electronically Signed   By: Beckie Salts M.D.   On: 08/27/2020 14:13   DG Hip Unilat W or Wo Pelvis 2-3 Views Right  Result Date: 08/27/2020 CLINICAL DATA:  Difficulty raising the right leg following a fall. EXAM: DG HIP (WITH OR WITHOUT PELVIS) 2-3V RIGHT COMPARISON:  None. FINDINGS: Essentially nondisplaced right femoral neck fracture with no significant angulation. Lower lumbar spine degenerative changes. IMPRESSION: Essentially nondisplaced right femoral neck fracture. Electronically Signed    By: Beckie Salts M.D.   On: 08/27/2020 13:40        Assessment and Plan: RIGHT Hip fracture The patient will be seen by Dr. Dion Saucier, to evaluate for operative fixation of the RIGHT hip.   -Admit to med-surg bed -Oxycodone, acetaminophen or Dilaudid as tolerated for pain -Bed rest, apply ice, document sedation and vitals per Hip fracture protocol -NPO at midnight -Nutrition consulted -Hold following meds:  -aspirin    From the standpoint of her small traumatic SAH/ICH: These are small and at present she is asymptomatic.   -Attempt to maintain normal BP intraoperatively -Use isotonic fluids to avoid fluid shifts -Okay to use Lovenox 40 daily post-operatively (after 24 hours since injury, if stable)    From the CV standpoint: the patient is at intermediate CV risk for the planned surgery.  Patient has a RCRI score of 1 (active cardiac condition, CHF, CAD, DM treated with insulin, TIA/CVA, Cr > 2.0). The patient has no active cardiac symptoms; son reports her functional capacity is normally near 4 METs without symptoms, and she would be expected to be at average risk for cardiac complications from this intermediate risk procedure. -No further cardiac testing needed  Pulmonary: Patient does not have diagnosed COPD or OSA. Patient is at average risk for pulmonary complications.   Endocrine: Patient has no history of steroid use or DM.  Heme: Transfusion threshold 8 mg/dL.       Traumatic subarachnoid and intracerebral hemorrhage Only on low dose aspirin at baseline.   Currently asymptoamtic.  Discussed with Neurosurgery Drs. Ostergard and Maisie Fus.    -Periodic Neuro checks overnight -If stable, no need to repeat CT head  -If neurological status changes, will need to obtain CT head  -If neurological status stable, it is reasonable to initiate Lovenox 40 daily post-op for VTE ppx    Incidental COVID-19 infection This is an asymptomatic finding.  The patient is  asymptomatic and has no hypoxia.  No indication at present for steroids, given she is completely asymptomatic I think the benefit of remdesivir is low. -Monitor for symptoms -Maintain isolation until Feb 8   Coronary disease, secondary prevention Hypertension -Hold aspirin -Continue metoprolol and Lipitor  Dementia -Resume home medicines when med rec is complete         DVT prophylaxis: SCDs  Diet: NPO atmidnight Code Status: DNR  Family Communication: Son, code status confirmed  Disposition Plan: Anticipate evaluation by Orthopedics and surgical fixation tomorrow, then PT evaluation and discharge to SNF in 2-3 days. Admission status: INPATIENT for hip fracture, medical surgical bed     Medical decision making and consults: Patient seen at 7:24 PM on 08/27/2020.  The patient was discussed with Dr. Rodena Medin, Dr. Dion Saucier, and Dr. Maisie Fus. What exists of the patient's chart was reviewed in depth and  summarized above.  Clinical condition: stable.      Earl Lites [place] [facility]

## 2020-08-27 NOTE — ED Notes (Signed)
Patient transported to CT 

## 2020-08-27 NOTE — ED Notes (Signed)
[place], RN bedside with ultrasound for peripheral IV.

## 2020-08-27 NOTE — ED Provider Notes (Signed)
Patient seen after prior ED provider.  Dr. Dion Saucier of orthopedics is aware of case and will evaluate the patient's hip fracture.  Dr. Maryfrances Bunnell of the hospitalist service is aware of case and will evaluate for admission.    Wynetta Fines, MD 08/27/20 1710

## 2020-08-27 NOTE — Progress Notes (Signed)
Patient with right valgus femoral neck fracture, minimal displacement, and subarachnoid hemorrhage at [facility].  Plan for right hip pinning when medically optimized, I will defer to neurosurgery as to which campus she should be monitored for the subarachnoid hemorrhage.  Likely surgery on hip tomorrow if stable.   Full consult to follow.   Eulas Post, MD

## 2020-08-28 ENCOUNTER — Inpatient Hospital Stay (HOSPITAL_COMMUNITY): Payer: Medicare HMO | Admitting: Anesthesiology

## 2020-08-28 ENCOUNTER — Ambulatory Visit: Payer: Medicare HMO | Admitting: Neurology

## 2020-08-28 ENCOUNTER — Encounter (HOSPITAL_COMMUNITY)
Admission: EM | Disposition: A | Discharge status: Skilled Nursing Facility | Payer: Self-pay | Source: Skilled Nursing Facility | Attending: Internal Medicine

## 2020-08-28 DIAGNOSIS — I10 Essential (primary) hypertension: Secondary | ICD-10-CM

## 2020-08-28 DIAGNOSIS — I2510 Atherosclerotic heart disease of native coronary artery without angina pectoris: Secondary | ICD-10-CM | POA: Diagnosis not present

## 2020-08-28 DIAGNOSIS — S72001A Fracture of unspecified part of neck of right femur, initial encounter for closed fracture: Secondary | ICD-10-CM | POA: Diagnosis not present

## 2020-08-28 DIAGNOSIS — I609 Nontraumatic subarachnoid hemorrhage, unspecified: Secondary | ICD-10-CM

## 2020-08-28 DIAGNOSIS — E440 Moderate protein-calorie malnutrition: Secondary | ICD-10-CM

## 2020-08-28 LAB — BASIC METABOLIC PANEL
Anion gap: 11 (ref 5–15)
BUN: 11 mg/dL (ref 8–23)
CO2: 28 mmol/L (ref 22–32)
Calcium: 9.3 mg/dL (ref 8.9–10.3)
Chloride: 101 mmol/L (ref 98–111)
Creatinine, Ser: 0.56 mg/dL (ref 0.44–1.00)
GFR, Estimated: >60 mL/min (ref >60)
Glucose, Bld: 105 mg/dL — ABNORMAL HIGH (ref 70–99)
Potassium: 3.8 mmol/L (ref 3.5–5.1)
Sodium: 140 mmol/L (ref 135–145)

## 2020-08-28 LAB — CBC
HCT: 45 % (ref 36.0–46.0)
Hemoglobin: 14.1 g/dL (ref 12.0–15.0)
MCH: 28.6 pg (ref 26.0–34.0)
MCHC: 31.3 g/dL (ref 30.0–36.0)
MCV: 91.3 fL (ref 80.0–100.0)
Platelets: 202 10*3/uL (ref 150–400)
RBC: 4.93 MIL/uL (ref 3.87–5.11)
RDW: 12.7 % (ref 11.5–15.5)
WBC: 9.9 10*3/uL (ref 4.0–10.5)
nRBC: 0 % (ref 0.0–0.2)

## 2020-08-28 SURGERY — FIXATION, FEMUR, NECK, PERCUTANEOUS, USING SCREW
Anesthesia: Choice | Site: Hip | Laterality: Right

## 2020-08-28 MED ORDER — BUPIVACAINE HCL 0.25 % IJ SOLN
INTRAMUSCULAR | Status: AC
  Filled 2020-08-28: qty 1

## 2020-08-28 MED ORDER — DEXAMETHASONE SODIUM PHOSPHATE 10 MG/ML IJ SOLN
INTRAMUSCULAR | Status: AC
  Filled 2020-08-28: qty 1

## 2020-08-28 MED ORDER — PROPOFOL 10 MG/ML IV BOLUS
INTRAVENOUS | Status: AC
  Filled 2020-08-28: qty 20

## 2020-08-28 MED ORDER — ONDANSETRON HCL 4 MG/2ML IJ SOLN
INTRAMUSCULAR | Status: AC
  Filled 2020-08-28: qty 2

## 2020-08-28 MED ORDER — FENTANYL CITRATE (PF) 100 MCG/2ML IJ SOLN: 25.0000 ug | INTRAMUSCULAR | Status: DC | PRN

## 2020-08-28 MED ORDER — ENSURE SURGERY PO LIQD
237.0000 mL | Freq: Two times a day (BID) | ORAL | Status: DC
  Administered 2020-08-30 – 2020-09-04 (×12): 237 mL via ORAL
  Filled 2020-08-28 (×14): qty 237

## 2020-08-28 SURGICAL SUPPLY — 33 items
BAG ZIPLOCK 12X15 (MISCELLANEOUS) ×2 IMPLANT
BNDG COHESIVE 6X5 TAN STRL LF (GAUZE/BANDAGES/DRESSINGS) ×2 IMPLANT
COVER SURGICAL LIGHT HANDLE (MISCELLANEOUS) ×2 IMPLANT
COVER WAND RF STERILE (DRAPES) IMPLANT
DECANTER SPIKE VIAL GLASS SM (MISCELLANEOUS) ×2 IMPLANT
DRAPE STERI IOBAN 125X83 (DRAPES) ×2 IMPLANT
DRSG AQUACEL AG ADV 3.5X 4 (GAUZE/BANDAGES/DRESSINGS) ×4 IMPLANT
DURAPREP 26ML APPLICATOR (WOUND CARE) ×2 IMPLANT
ELECT REM PT RETURN 15FT ADLT (MISCELLANEOUS) ×2 IMPLANT
FACESHIELD WRAPAROUND (MASK) ×4 IMPLANT
GLOVE ORTHO TXT STRL SZ7.5 (GLOVE) ×2 IMPLANT
GLOVE SRG 8 PF TXTR STRL LF DI (GLOVE) ×1 IMPLANT
GLOVE SURG ENC MOIS LTX SZ7 (GLOVE) ×2 IMPLANT
GLOVE SURG UNDER POLY LF SZ7 (GLOVE) ×2 IMPLANT
GLOVE SURG UNDER POLY LF SZ8 (GLOVE) ×1
GOWN STRL REUS W/ TWL LRG LVL3 (GOWN DISPOSABLE) ×2 IMPLANT
GOWN STRL REUS W/TWL LRG LVL3 (GOWN DISPOSABLE) ×4 IMPLANT
KIT BASIN OR (CUSTOM PROCEDURE TRAY) ×2 IMPLANT
KIT TURNOVER KIT A (KITS) IMPLANT
NEEDLE HYPO 22GX1.5 SAFETY (NEEDLE) ×2 IMPLANT
NS IRRIG 1000ML POUR BTL (IV SOLUTION) ×2 IMPLANT
PACK GENERAL/GYN (CUSTOM PROCEDURE TRAY) ×2 IMPLANT
PENCIL SMOKE EVACUATOR (MISCELLANEOUS) IMPLANT
PROTECTOR NERVE ULNAR (MISCELLANEOUS) ×2 IMPLANT
STRIP CLOSURE SKIN 1/2X4 (GAUZE/BANDAGES/DRESSINGS) ×2 IMPLANT
SUT VIC AB 0 CT1 27 (SUTURE) ×1
SUT VIC AB 0 CT1 27XBRD ANTBC (SUTURE) ×1 IMPLANT
SUT VIC AB 3-0 SH 8-18 (SUTURE) ×2 IMPLANT
SYR 20ML LL LF (SYRINGE) ×2 IMPLANT
TOWEL OR 17X26 10 PK STRL BLUE (TOWEL DISPOSABLE) ×2 IMPLANT
TOWEL OR NON WOVEN STRL DISP B (DISPOSABLE) ×2 IMPLANT
TRAY FOLEY MTR SLVR 16FR STAT (SET/KITS/TRAYS/PACK) IMPLANT
WATER STERILE IRR 1000ML POUR (IV SOLUTION) ×2 IMPLANT

## 2020-08-28 NOTE — Progress Notes (Signed)
Patient apparently was fed breakfast, will have to postpone surgery until tomorrow. Discussed with family.    Eulas Post, MD

## 2020-08-28 NOTE — Progress Notes (Signed)
The patient will be been re-examined in the room, and the chart reviewed, and there have been no interval changes to the documented history and physical.    The risks, benefits, and alternatives have been discussed at length, and the patient and family is willing to proceed.    Eulas Post, MD

## 2020-08-28 NOTE — Plan of Care (Signed)
  Problem: Education: Goal: Knowledge of risk factors and measures for prevention of condition will improve Outcome: Progressing   

## 2020-08-28 NOTE — Anesthesia Preprocedure Evaluation (Addendum)
Anesthesia Evaluation  Patient identified by MRN, date of birth, ID band Patient awake    Reviewed: Allergy & Precautions, H&P , NPO status , Patient's Chart, lab work & pertinent test results, reviewed documented beta blocker date and time   Airway        Dental no notable dental hx.    Pulmonary neg pulmonary ROS,    Pulmonary exam normal        Cardiovascular hypertension, Pt. on medications and Pt. on home beta blockers + CAD and + Past MI       Neuro/Psych Anxiety Depression Dementia negative neurological ROS     GI/Hepatic negative GI ROS, Neg liver ROS,   Endo/Other  negative endocrine ROS  Renal/GU negative Renal ROS  negative genitourinary   Musculoskeletal   Abdominal   Peds  Hematology negative hematology ROS (+)   Anesthesia Other Findings   Reproductive/Obstetrics negative OB ROS                             Anesthesia Physical Anesthesia Plan  ASA: III  Anesthesia Plan: Spinal   Post-op Pain Management:    Induction: Intravenous  PONV Risk Score and Plan: 3 and Propofol infusion, Ondansetron and Dexamethasone  Airway Management Planned: Simple Face Mask  Additional Equipment:   Intra-op Plan:   Post-operative Plan:   Informed Consent: I have reviewed the patients History and Physical, chart, labs and discussed the procedure including the risks, benefits and alternatives for the proposed anesthesia with the patient or authorized representative who has indicated his/her understanding and acceptance.     Dental advisory given  Plan Discussed with: CRNA  Anesthesia Plan Comments: (Case cancelled. Pt ate breakfast.)       Anesthesia Quick Evaluation

## 2020-08-28 NOTE — Progress Notes (Signed)
PROGRESS NOTE    Tiffany Mack  BWL:[id number] DOB: [birth year] DOA: 08/27/2020 PCP: Mila Palmer, MD    Brief Narrative:  Tiffany Mack was admitted to the hospital with working diagnosis of right hip fracture complicated by a small traumatic subarachnoid hemorrhage.  80 year old female with past medical history of dementia, coronary disease and hypertension, she is a resident of a memory care unit.  Patient sustained a mechanical fall while tying her shoes, she fell forward out of her chair onto her head. On her initial physical examination blood pressure was 159/60, heart rate 85, temperature 98, respirate 19, oxygen saturation 95%, her lungs are clear to auscultation bilaterally, heart S1-S2, present rhythmic, abdomen is soft nontender, no lower extremity edema.  Patient confused and disoriented, no agitation.  She had a team assist on the right frontal region.  Sodium 144, potassium 3.6, chloride 105, bicarb 30, glucose 117, BUN 14, creatinine 0.69, white count 13.7, hemoglobin 14.1, hematocrit 43.5, platelets 221. SARS COVID-19 positive  Hip x-ray showing nondisplaced right femoral neck fracture. Head CT with multiple areas of parenchymal and subarachnoid hemorrhage in the right frontal region.  Large right lateral frontal scalp hematoma without skull fracture.  No cervical spine fracture or subluxation.  Stable moderate diffuse cerebral and cerebellar atrophy.    Assessment & Plan:   Principal Problem:   Hip fracture (HCC) Active Problems:   HTN (hypertension)   Dementia (HCC)   CAD (coronary artery disease)   Malnutrition of moderate degree   Subarachnoid hemorrhage (HCC)   1. Traumatic right femoral neck fracture and right frontal parenchymal and subarachnoid hemorrhage.  Patient is awake and alert, no significant head ache or right hip pain. No further intervention per neurosurgery recommendations, unless change in neurologic status.  Continue with neuro  checks and holding anticoagulants.   Plan for orthopedic intervention to right hip in am. Continue pain control. If no neurologic change will start pharmacologic dvt prophylaxis with enoxaparin post op.   2. COPD. No signs of exacerbation, continue oxymetry monitoring.   3. HTN/ CAD/ dyslipidemia. Blood pressure controlled, patient is chest pain free, will continue to hold on aspirin.  Continue statin 80 mg daily of atorvastatin.  Blood pressure control with metoprolol   4. Dementia/ moderate calorie malnutrition. Continue neuro checks, no agitation.  Consult nutrition for recommendations, post op. Patient will be NPO past midnight.   5. Positive SARS COVID 19 viral infection. Patient tested positive 2 weeks ago in the memory units, she remained asymptomatic. Will call the facility for report, then patient can come off isolation.    Status is: Inpatient  Remains inpatient appropriate because:IV treatments appropriate due to intensity of illness or inability to take PO   Dispo: The patient is from: SNF              Anticipated d/c is to: SNF              Anticipated d/c date is: 3 days              Patient currently is not medically stable to d/c.   Difficult to place patient No   DVT prophylaxis: scd   Code Status:   dnr   Family Communication:  I spoke over the phone with the patient's son about patient's  condition, plan of care, prognosis and all questions were addressed.     Consultants:   Neurosurgery   Orthopedic     Subjective: Patient is confused and disorientated, no complains of pain  or dyspnea, no cough, no nausea or vomiting.   Objective: Vitals:   08/28/20 0032 08/28/20 0429 08/28/20 0848 08/28/20 1408  BP: (!) 149/58 (!) 169/62 (!) 142/59 133/78  Pulse: 70 68 79 89  Resp: 16 13 16 16   Temp: 98.2 F (36.8 C) 98.1 F (36.7 C) 98.1 F (36.7 C) 98.2 F (36.8 C)  TempSrc: Oral Oral Oral Oral  SpO2: 100% (!) 88% 94% 96%  Weight:      Height:         Intake/Output Summary (Last 24 hours) at 08/28/2020 1503 Last data filed at 08/28/2020 0848 Gross per 24 hour  Intake 533 ml  Output 0 ml  Net 533 ml   Filed Weights   08/27/20 2015  Weight: 72.4 kg    Examination:   General: Not in pain or dyspnea, deconditioned  Neurology: Awake and alert, confused and disorientated.  E ENT: no pallor, no icterus, oral mucosa moist Cardiovascular: No JVD. S1-S2 present, rhythmic, no gallops, rubs, or murmurs. No lower extremity edema. Pulmonary: positive breath sounds bilaterally, Gastrointestinal. Abdomen soft and non tender Skin. No rashes Musculoskeletal: shortening of right lower extremity.      Data Reviewed: I have personally reviewed following labs and imaging studies  CBC: Recent Labs  Lab 08/27/20 1503 08/28/20 0340  WBC 13.7* 9.9  NEUTROABS 10.8*  --   HGB 14.1 14.1  HCT 43.5 45.0  MCV 90.1 91.3  PLT 221 202   Basic Metabolic Panel: Recent Labs  Lab 08/27/20 1503 08/28/20 0340  NA 144 140  K 3.6 3.8  CL 105 101  CO2 30 28  GLUCOSE 117* 105*  BUN 14 11  CREATININE 0.69 0.56  CALCIUM 9.4 9.3   GFR: Estimated Creatinine Clearance: 59.6 mL/min (by C-G formula based on SCr of 0.56 mg/dL). Liver Function Tests: No results for input(s): AST, ALT, ALKPHOS, BILITOT, PROT, ALBUMIN in the last 168 hours. No results for input(s): LIPASE, AMYLASE in the last 168 hours. No results for input(s): AMMONIA in the last 168 hours. Coagulation Profile: Recent Labs  Lab 08/27/20 1503  INR 1.0   Cardiac Enzymes: No results for input(s): CKTOTAL, CKMB, CKMBINDEX, TROPONINI in the last 168 hours. BNP (last 3 results) No results for input(s): PROBNP in the last 8760 hours. HbA1C: No results for input(s): HGBA1C in the last 72 hours. CBG: No results for input(s): GLUCAP in the last 168 hours. Lipid Profile: No results for input(s): CHOL, HDL, LDLCALC, TRIG, CHOLHDL, LDLDIRECT in the last 72 hours. Thyroid Function  Tests: No results for input(s): TSH, T4TOTAL, FREET4, T3FREE, THYROIDAB in the last 72 hours. Anemia Panel: No results for input(s): VITAMINB12, FOLATE, FERRITIN, TIBC, IRON, RETICCTPCT in the last 72 hours.    Radiology Studies: I have reviewed all of the imaging during this hospital visit personally     Scheduled Meds: . atorvastatin  80 mg Oral q1800  . docusate sodium  100 mg Oral BID  . metoprolol tartrate  12.5 mg Oral BID   Continuous Infusions:   LOS: 1 day        Mauricio 08/29/20, MD

## 2020-08-28 NOTE — Progress Notes (Signed)
Initial Nutrition Assessment  INTERVENTION:   -Ensure Surgery po BID, each supplement provides 350 kcal and 20 grams of protein  NUTRITION DIAGNOSIS:   Increased nutrient needs related to hip fracture as evidenced by estimated needs.  GOAL:   Patient will meet greater than or equal to 90% of their needs  MONITOR:   PO intake,Supplement acceptance,Labs,Weight trends,I & O's  REASON FOR ASSESSMENT:   Consult Hip fracture protocol  ASSESSMENT:   80 y.o.  F with dementia, lives in memory care, CAD s/p PCI 2017, and HTN who presents with hip pain and head bruise after a fall. Admitted for right hip fracture.  Pt is incidentally COVID-19+.  Patient is currently alert/oriented x 1. Unable to provide history.  Pt did consume 80% of breakfast this morning but now has to have surgery for hip fracture tomorrow. Will order Ensure supplements for today and for post-op healing.  Per weight records, no significant weight changes.   Labs reviewed. Medications reviewed.  NUTRITION - FOCUSED PHYSICAL EXAM:  Unable to complete  Diet Order:   Diet Order            Diet NPO time specified  Diet effective midnight           Diet regular Room service appropriate? Yes; Fluid consistency: Thin  Diet effective now                 EDUCATION NEEDS:   No education needs have been identified at this time  Skin:  Skin Assessment: Reviewed RN Assessment  Last BM:  PTA  Height:   Ht Readings from Last 1 Encounters:  08/27/20 5\' 9"  (1.753 m)    Weight:   Wt Readings from Last 1 Encounters:  08/27/20 72.4 kg   BMI:  Body mass index is 23.57 kg/m.  Estimated Nutritional Needs:   Kcal:  1800-2000  Protein:  80-95g  Fluid:  2L/day  08/29/20, MS, RD, LDN Inpatient Clinical Dietitian Contact information available via Amion

## 2020-08-29 ENCOUNTER — Inpatient Hospital Stay (HOSPITAL_COMMUNITY): Payer: Medicare HMO

## 2020-08-29 ENCOUNTER — Inpatient Hospital Stay (HOSPITAL_COMMUNITY): Payer: Medicare HMO | Admitting: Certified Registered Nurse Anesthetist

## 2020-08-29 ENCOUNTER — Encounter (HOSPITAL_COMMUNITY)
Admission: EM | Disposition: A | Discharge status: Skilled Nursing Facility | Payer: Self-pay | Source: Skilled Nursing Facility | Attending: Internal Medicine

## 2020-08-29 DIAGNOSIS — I10 Essential (primary) hypertension: Secondary | ICD-10-CM | POA: Diagnosis not present

## 2020-08-29 DIAGNOSIS — I609 Nontraumatic subarachnoid hemorrhage, unspecified: Secondary | ICD-10-CM | POA: Diagnosis not present

## 2020-08-29 DIAGNOSIS — S72001A Fracture of unspecified part of neck of right femur, initial encounter for closed fracture: Secondary | ICD-10-CM | POA: Diagnosis not present

## 2020-08-29 DIAGNOSIS — I2510 Atherosclerotic heart disease of native coronary artery without angina pectoris: Secondary | ICD-10-CM | POA: Diagnosis not present

## 2020-08-29 HISTORY — PX: HIP PINNING,CANNULATED: SHX1758

## 2020-08-29 LAB — BASIC METABOLIC PANEL
Anion gap: 11 (ref 5–15)
BUN: 8 mg/dL (ref 8–23)
CO2: 29 mmol/L (ref 22–32)
Calcium: 9.3 mg/dL (ref 8.9–10.3)
Chloride: 100 mmol/L (ref 98–111)
Creatinine, Ser: 0.61 mg/dL (ref 0.44–1.00)
GFR, Estimated: >60 mL/min (ref >60)
Glucose, Bld: 125 mg/dL — ABNORMAL HIGH (ref 70–99)
Potassium: 3.4 mmol/L — ABNORMAL LOW (ref 3.5–5.1)
Sodium: 140 mmol/L (ref 135–145)

## 2020-08-29 LAB — CBC WITH DIFFERENTIAL/PLATELET
Abs Immature Granulocytes: 0.03 10*3/uL (ref 0.00–0.07)
Basophils Absolute: 0.1 10*3/uL (ref 0.0–0.1)
Basophils Relative: 1 %
Eosinophils Absolute: 0.3 10*3/uL (ref 0.0–0.5)
Eosinophils Relative: 3 %
HCT: 43.8 % (ref 36.0–46.0)
Hemoglobin: 14.2 g/dL (ref 12.0–15.0)
Immature Granulocytes: 0 %
Lymphocytes Relative: 16 %
Lymphs Abs: 1.7 10*3/uL (ref 0.7–4.0)
MCH: 28.9 pg (ref 26.0–34.0)
MCHC: 32.4 g/dL (ref 30.0–36.0)
MCV: 89.2 fL (ref 80.0–100.0)
Monocytes Absolute: 0.6 10*3/uL (ref 0.1–1.0)
Monocytes Relative: 6 %
Neutro Abs: 7.8 10*3/uL — ABNORMAL HIGH (ref 1.7–7.7)
Neutrophils Relative %: 74 %
Platelets: 211 10*3/uL (ref 150–400)
RBC: 4.91 MIL/uL (ref 3.87–5.11)
RDW: 12.7 % (ref 11.5–15.5)
WBC: 10.4 10*3/uL (ref 4.0–10.5)
nRBC: 0 % (ref 0.0–0.2)

## 2020-08-29 SURGERY — FIXATION, FEMUR, NECK, PERCUTANEOUS, USING SCREW
Anesthesia: General | Site: Hip | Laterality: Right

## 2020-08-29 MED ORDER — ONDANSETRON HCL 4 MG PO TABS: 4.0000 mg | ORAL_TABLET | Freq: Four times a day (QID) | ORAL | Status: DC | PRN

## 2020-08-29 MED ORDER — MAGNESIUM CITRATE PO SOLN: 1.0000 | Freq: Once | ORAL | Status: DC | PRN

## 2020-08-29 MED ORDER — DEXAMETHASONE SODIUM PHOSPHATE 10 MG/ML IJ SOLN
INTRAMUSCULAR | Status: DC | PRN
  Administered 2020-08-29: 4 mg via INTRAVENOUS

## 2020-08-29 MED ORDER — ACETAMINOPHEN 500 MG PO TABS
500.0000 mg | ORAL_TABLET | Freq: Four times a day (QID) | ORAL | Status: AC
  Administered 2020-08-29 – 2020-08-30 (×4): 500 mg via ORAL
  Filled 2020-08-29 (×4): qty 1

## 2020-08-29 MED ORDER — LIDOCAINE 2% (20 MG/ML) 5 ML SYRINGE
INTRAMUSCULAR | Status: DC | PRN
  Administered 2020-08-29: 60 mg via INTRAVENOUS

## 2020-08-29 MED ORDER — SUCCINYLCHOLINE CHLORIDE 200 MG/10ML IV SOSY
PREFILLED_SYRINGE | INTRAVENOUS | Status: AC
  Filled 2020-08-29: qty 10

## 2020-08-29 MED ORDER — BUPIVACAINE HCL 0.25 % IJ SOLN
INTRAMUSCULAR | Status: AC
  Filled 2020-08-29: qty 1

## 2020-08-29 MED ORDER — ACETAMINOPHEN 325 MG PO TABS
325.0000 mg | ORAL_TABLET | Freq: Four times a day (QID) | ORAL | Status: DC | PRN
  Administered 2020-08-31: 650 mg via ORAL
  Filled 2020-08-29: qty 2

## 2020-08-29 MED ORDER — 0.9 % SODIUM CHLORIDE (POUR BTL) OPTIME
TOPICAL | Status: DC | PRN
  Administered 2020-08-29: 1000 mL

## 2020-08-29 MED ORDER — CEFAZOLIN SODIUM-DEXTROSE 2-4 GM/100ML-% IV SOLN
INTRAVENOUS | Status: AC
  Filled 2020-08-29: qty 100

## 2020-08-29 MED ORDER — ONDANSETRON HCL 4 MG/2ML IJ SOLN: 4.0000 mg | Freq: Four times a day (QID) | INTRAMUSCULAR | Status: DC | PRN

## 2020-08-29 MED ORDER — PHENOL 1.4 % MT LIQD
1.0000 | OROMUCOSAL | Status: DC | PRN
  Filled 2020-08-29: qty 177

## 2020-08-29 MED ORDER — ONDANSETRON HCL 4 MG/2ML IJ SOLN
INTRAMUSCULAR | Status: DC | PRN
  Administered 2020-08-29: 4 mg via INTRAVENOUS

## 2020-08-29 MED ORDER — POVIDONE-IODINE 10 % EX SWAB: 2.0000 "application " | Freq: Once | CUTANEOUS | Status: DC

## 2020-08-29 MED ORDER — PHENYLEPHRINE 40 MCG/ML (10ML) SYRINGE FOR IV PUSH (FOR BLOOD PRESSURE SUPPORT)
PREFILLED_SYRINGE | INTRAVENOUS | Status: DC | PRN
  Administered 2020-08-29: 80 ug via INTRAVENOUS
  Administered 2020-08-29: 40 ug via INTRAVENOUS
  Administered 2020-08-29: 80 ug via INTRAVENOUS
  Administered 2020-08-29: 40 ug via INTRAVENOUS

## 2020-08-29 MED ORDER — CEFAZOLIN SODIUM-DEXTROSE 2-4 GM/100ML-% IV SOLN
2.0000 g | Freq: Four times a day (QID) | INTRAVENOUS | Status: AC
Start: 2020-08-29 — End: 2020-08-30
  Administered 2020-08-29 – 2020-08-30 (×2): 2 g via INTRAVENOUS
  Filled 2020-08-29 (×2): qty 100

## 2020-08-29 MED ORDER — CHLORHEXIDINE GLUCONATE 4 % EX LIQD: 60.0000 mL | Freq: Once | CUTANEOUS | Status: DC

## 2020-08-29 MED ORDER — PHENYLEPHRINE 40 MCG/ML (10ML) SYRINGE FOR IV PUSH (FOR BLOOD PRESSURE SUPPORT)
PREFILLED_SYRINGE | INTRAVENOUS | Status: AC
  Filled 2020-08-29: qty 10

## 2020-08-29 MED ORDER — LIDOCAINE HCL (PF) 2 % IJ SOLN
INTRAMUSCULAR | Status: AC
  Filled 2020-08-29: qty 5

## 2020-08-29 MED ORDER — FENTANYL CITRATE (PF) 250 MCG/5ML IJ SOLN
INTRAMUSCULAR | Status: DC | PRN
  Administered 2020-08-29 (×2): 50 ug via INTRAVENOUS

## 2020-08-29 MED ORDER — EPHEDRINE 5 MG/ML INJ
INTRAVENOUS | Status: AC
  Filled 2020-08-29: qty 10

## 2020-08-29 MED ORDER — BUPIVACAINE HCL (PF) 0.25 % IJ SOLN
INTRAMUSCULAR | Status: DC | PRN
  Administered 2020-08-29: 20 mL

## 2020-08-29 MED ORDER — LACTATED RINGERS IV SOLN: INTRAVENOUS | Status: DC | PRN

## 2020-08-29 MED ORDER — TRAMADOL HCL 50 MG PO TABS
50.0000 mg | ORAL_TABLET | Freq: Four times a day (QID) | ORAL | Status: DC | PRN
  Administered 2020-08-30 – 2020-08-31 (×2): 50 mg via ORAL
  Filled 2020-08-29 (×3): qty 1

## 2020-08-29 MED ORDER — FENTANYL CITRATE (PF) 100 MCG/2ML IJ SOLN
INTRAMUSCULAR | Status: AC
  Filled 2020-08-29: qty 2

## 2020-08-29 MED ORDER — ROCURONIUM BROMIDE 10 MG/ML (PF) SYRINGE
PREFILLED_SYRINGE | INTRAVENOUS | Status: AC
  Filled 2020-08-29: qty 10

## 2020-08-29 MED ORDER — PROPOFOL 10 MG/ML IV BOLUS
INTRAVENOUS | Status: DC | PRN
  Administered 2020-08-29: 80 mg via INTRAVENOUS

## 2020-08-29 MED ORDER — ROCURONIUM BROMIDE 10 MG/ML (PF) SYRINGE
PREFILLED_SYRINGE | INTRAVENOUS | Status: DC | PRN
  Administered 2020-08-29: 60 mg via INTRAVENOUS

## 2020-08-29 MED ORDER — ONDANSETRON HCL 4 MG/2ML IJ SOLN: 4.0000 mg | Freq: Once | INTRAMUSCULAR | Status: DC | PRN

## 2020-08-29 MED ORDER — SODIUM CHLORIDE 0.45 % IV SOLN: INTRAVENOUS | Status: DC

## 2020-08-29 MED ORDER — HYDROMORPHONE HCL 1 MG/ML IJ SOLN: 0.2500 mg | INTRAMUSCULAR | Status: DC | PRN

## 2020-08-29 MED ORDER — DEXAMETHASONE SODIUM PHOSPHATE 10 MG/ML IJ SOLN
INTRAMUSCULAR | Status: AC
  Filled 2020-08-29: qty 1

## 2020-08-29 MED ORDER — ALUM & MAG HYDROXIDE-SIMETH 200-200-20 MG/5ML PO SUSP: 30.0000 mL | ORAL | Status: DC | PRN

## 2020-08-29 MED ORDER — CEFAZOLIN SODIUM-DEXTROSE 2-4 GM/100ML-% IV SOLN
2.0000 g | INTRAVENOUS | Status: AC
  Administered 2020-08-29: 2 g via INTRAVENOUS
  Filled 2020-08-29: qty 100

## 2020-08-29 MED ORDER — MIDAZOLAM HCL 2 MG/2ML IJ SOLN
INTRAMUSCULAR | Status: AC
  Filled 2020-08-29: qty 2

## 2020-08-29 MED ORDER — PHENYLEPHRINE HCL-NACL 10-0.9 MG/250ML-% IV SOLN
INTRAVENOUS | Status: DC | PRN
  Administered 2020-08-29: 50 ug/min via INTRAVENOUS

## 2020-08-29 MED ORDER — PHENYLEPHRINE HCL (PRESSORS) 10 MG/ML IV SOLN
INTRAVENOUS | Status: AC
  Filled 2020-08-29: qty 1

## 2020-08-29 MED ORDER — ACETAMINOPHEN 500 MG PO TABS: 1000.0000 mg | ORAL_TABLET | Freq: Once | ORAL | Status: DC

## 2020-08-29 MED ORDER — PROPOFOL 10 MG/ML IV BOLUS
INTRAVENOUS | Status: AC
  Filled 2020-08-29: qty 20

## 2020-08-29 MED ORDER — MENTHOL 3 MG MT LOZG
1.0000 | LOZENGE | OROMUCOSAL | Status: DC | PRN
Start: 2020-08-29 — End: 2020-09-04

## 2020-08-29 MED ORDER — SUGAMMADEX SODIUM 200 MG/2ML IV SOLN
INTRAVENOUS | Status: DC | PRN
  Administered 2020-08-29: 150 mg via INTRAVENOUS

## 2020-08-29 MED ORDER — ONDANSETRON HCL 4 MG/2ML IJ SOLN
INTRAMUSCULAR | Status: AC
  Filled 2020-08-29: qty 2

## 2020-08-29 SURGICAL SUPPLY — 41 items
BAG ZIPLOCK 12X15 (MISCELLANEOUS) ×2 IMPLANT
BIT DRILL CANNULATED (DRILL) ×1 IMPLANT
BNDG COHESIVE 6X5 TAN STRL LF (GAUZE/BANDAGES/DRESSINGS) ×4 IMPLANT
COVER SURGICAL LIGHT HANDLE (MISCELLANEOUS) ×2 IMPLANT
COVER WAND RF STERILE (DRAPES) IMPLANT
DECANTER SPIKE VIAL GLASS SM (MISCELLANEOUS) ×2 IMPLANT
DRAPE STERI IOBAN 125X83 (DRAPES) ×2 IMPLANT
DRILL CANNULATED (DRILL) ×2
DRSG AQUACEL AG ADV 3.5X 4 (GAUZE/BANDAGES/DRESSINGS) ×2 IMPLANT
DURAPREP 26ML APPLICATOR (WOUND CARE) ×2 IMPLANT
ELECT REM PT RETURN 15FT ADLT (MISCELLANEOUS) ×2 IMPLANT
FACESHIELD WRAPAROUND (MASK) ×6 IMPLANT
GLOVE ORTHO TXT STRL SZ7.5 (GLOVE) ×2 IMPLANT
GLOVE SRG 8 PF TXTR STRL LF DI (GLOVE) ×1 IMPLANT
GLOVE SURG ENC MOIS LTX SZ7 (GLOVE) ×2 IMPLANT
GLOVE SURG UNDER POLY LF SZ7 (GLOVE) ×2 IMPLANT
GLOVE SURG UNDER POLY LF SZ8 (GLOVE) ×1
GOWN STRL REUS W/ TWL LRG LVL3 (GOWN DISPOSABLE) ×2 IMPLANT
GOWN STRL REUS W/TWL LRG LVL3 (GOWN DISPOSABLE) ×6 IMPLANT
GUIDE PIN 3.2MM (PIN) ×1
GUIDE PIN ORTH 12X3.2X (PIN) ×1 IMPLANT
KIT BASIN OR (CUSTOM PROCEDURE TRAY) ×2 IMPLANT
KIT TURNOVER KIT A (KITS) IMPLANT
NEEDLE HYPO 22GX1.5 SAFETY (NEEDLE) ×2 IMPLANT
NS IRRIG 1000ML POUR BTL (IV SOLUTION) ×2 IMPLANT
PACK GENERAL/GYN (CUSTOM PROCEDURE TRAY) ×2 IMPLANT
PENCIL SMOKE EVACUATOR (MISCELLANEOUS) IMPLANT
PROTECTOR NERVE ULNAR (MISCELLANEOUS) ×2 IMPLANT
SCREW CANN RVR CUT FLUT 90X16X (Screw) ×1 IMPLANT
SCREW CANN RVRS CUT FLT 85X16 (Screw) ×2 IMPLANT
SCREW CANNULATED 6.5X85MM (Screw) ×4 IMPLANT
SCREW CANNULATED 6.5X90 (Screw) ×1 IMPLANT
STRIP CLOSURE SKIN 1/2X4 (GAUZE/BANDAGES/DRESSINGS) ×2 IMPLANT
SUT VIC AB 0 CT1 27 (SUTURE) ×1
SUT VIC AB 0 CT1 27XBRD ANTBC (SUTURE) ×1 IMPLANT
SUT VIC AB 3-0 SH 8-18 (SUTURE) ×2 IMPLANT
SYR 20ML LL LF (SYRINGE) ×2 IMPLANT
TOWEL OR 17X26 10 PK STRL BLUE (TOWEL DISPOSABLE) ×2 IMPLANT
TOWEL OR NON WOVEN STRL DISP B (DISPOSABLE) ×2 IMPLANT
TRAY FOLEY MTR SLVR 16FR STAT (SET/KITS/TRAYS/PACK) IMPLANT
WATER STERILE IRR 1000ML POUR (IV SOLUTION) IMPLANT

## 2020-08-29 NOTE — Anesthesia Procedure Notes (Signed)
Procedure Name: Intubation Date/Time: 08/29/2020 1:36 PM Performed by: Sindy Guadeloupe, CRNA Pre-anesthesia Checklist: Patient identified, Emergency Drugs available, Suction available, Patient being monitored and Timeout performed Patient Re-evaluated:Patient Re-evaluated prior to induction Oxygen Delivery Method: Circle system utilized Preoxygenation: Pre-oxygenation with 100% oxygen Induction Type: IV induction Ventilation: Mask ventilation without difficulty Laryngoscope Size: Glidescope and 3 Tube type: Oral Number of attempts: 1 Airway Equipment and Method: Stylet Placement Confirmation: ETT inserted through vocal cords under direct vision,  positive ETCO2 and breath sounds checked- equal and bilateral Secured at: 22 cm Tube secured with: Tape Dental Injury: Teeth and Oropharynx as per pre-operative assessment  Comments: Easy mask ventilation, DVL x 1, could visualize epiglottis only, Glidescope x 1 with easy atraumatic intubation

## 2020-08-29 NOTE — Progress Notes (Addendum)
PROGRESS NOTE    Jalexus Brett  JSE:[id number] DOB: Aug 12, 1940 DOA: 08/27/2020 PCP: Mila Palmer, MD    Brief Narrative:  Mrs. Tafolla was admitted to the hospital with working diagnosis of right hip fracture complicated by a small traumatic subarachnoid hemorrhage.  80 year old female with past medical history of dementia, coronary disease and hypertension, she is a resident of a memory care unit.  Patient sustained a mechanical fall while tying her shoes, she fell forward out of her chair onto her head. On her initial physical examination blood pressure was 159/60, heart rate 85, temperature 98, respirate 19, oxygen saturation 95%, her lungs are clear to auscultation bilaterally, heart S1-S2, present rhythmic, abdomen is soft nontender, no lower extremity edema.  Patient confused and disoriented, no agitation.  She had a team assist on the right frontal region.  Sodium 144, potassium 3.6, chloride 105, bicarb 30, glucose 117, BUN 14, creatinine 0.69, white count 13.7, hemoglobin 14.1, hematocrit 43.5, platelets 221. SARS COVID-19 positive  Hip x-ray showing nondisplaced right femoral neck fracture. Head CT with multiple areas of parenchymal and subarachnoid hemorrhage in the right frontal region.  Large right lateral frontal scalp hematoma without skull fracture.  No cervical spine fracture or subluxation.  Stable moderate diffuse cerebral and cerebellar atrophy.  Patient underwent right cannulated hip pinning with no complications.    Assessment & Plan:   Principal Problem:   Hip fracture (HCC) Active Problems:   HTN (hypertension)   Dementia (HCC)   CAD (coronary artery disease)   Malnutrition of moderate degree   Subarachnoid hemorrhage (HCC)    1. Traumatic right femoral neck fracture and right frontal parenchymal and subarachnoid hemorrhage.  Patient is awake and alert, no significant head ache or right hip pain. No further intervention per  neurosurgery recommendations, unless change in neurologic status.  Patient underwent orthopedics procedure with no major complications. Continue analgesia. Follow with pt, ot and surgery recommendations.   2. COPD. No clinical signs of exacerbation, continue oxymetry monitoring.   3. HTN/ CAD/ dyslipidemia.  On atorvastatin hold on asa for now.  Continue with metoprolol for blood pressure control   4. Dementia/ moderate calorie malnutrition.  Advanced dementia, with no agitation.  Follow with nutrition recommendations  5. Positive SARS COVID 19 viral infection. Patient tested positive 2 weeks ago in the memory units, she remained asymptomatic.  I have called the facility [facility]) for COVID report, pending fax results, then isolation can be discontinued     Status is: Inpatient  Remains inpatient appropriate because:Inpatient level of care appropriate due to severity of illness   Dispo: The patient is from: SNF              Anticipated d/c is to: SNF              Anticipated d/c date is: 2 days              Patient currently is not medically stable to d/c.   Difficult to place patient No   DVT prophylaxis: scd   Code Status:    dnr   Family Communication:   I spoke with her son yesterday and all questions were addressed.,      Nutrition Status: Nutrition Problem: Increased nutrient needs Etiology: hip fracture Signs/Symptoms: estimated needs Interventions: Ensure Surgery     Skin Documentation:     Consultants:   Orthopedics   Procedures:   Right hip pinning    Subjective: Patient is feeling well, her pain is well  controlled,   Objective: Vitals:   08/29/20 1530 08/29/20 1537 08/29/20 1545 08/29/20 1819  BP: (!) 158/73 (!) 156/72 (!) 145/73 (!) 156/89  Pulse:    97  Resp: 14 15 16 16   Temp:    98.1 F (36.7 C)  TempSrc:    Oral  SpO2: 100% 96% 98% 97%  Weight:      Height:        Intake/Output Summary (Last 24 hours) at 08/29/2020  1854 Last data filed at 08/29/2020 1455 Gross per 24 hour  Intake 953.38 ml  Output 970 ml  Net -16.62 ml   Filed Weights   08/27/20 2015  Weight: 72.4 kg    Examination:   General: Not in pain or dyspnea  Neurology: Awake and alert, non focal, confused and disorientated.  E ENT: no pallor, no icterus, oral mucosa moist Cardiovascular: No JVD. S1-S2 present, rhythmic, no gallops, rubs, or murmurs. No lower extremity edema. Pulmonary: positive breath sounds bilaterally, adequate air movement, no wheezing, rhonchi or rales. Gastrointestinal. Abdomen soft and non tender Skin. No rashes Musculoskeletal: no joint deformities     Data Reviewed: I have personally reviewed following labs and imaging studies  CBC: Recent Labs  Lab 08/27/20 1503 08/28/20 0340 08/29/20 0332  WBC 13.7* 9.9 10.4  NEUTROABS 10.8*  --  7.8*  HGB 14.1 14.1 14.2  HCT 43.5 45.0 43.8  MCV 90.1 91.3 89.2  PLT 221 202 211   Basic Metabolic Panel: Recent Labs  Lab 08/27/20 1503 08/28/20 0340 08/29/20 0332  NA 144 140 140  K 3.6 3.8 3.4*  CL 105 101 100  CO2 30 28 29   GLUCOSE 117* 105* 125*  BUN 14 11 8   CREATININE 0.69 0.56 0.61  CALCIUM 9.4 9.3 9.3   GFR: Estimated Creatinine Clearance: 59.6 mL/min (by C-G formula based on SCr of 0.61 mg/dL). Liver Function Tests: No results for input(s): AST, ALT, ALKPHOS, BILITOT, PROT, ALBUMIN in the last 168 hours. No results for input(s): LIPASE, AMYLASE in the last 168 hours. No results for input(s): AMMONIA in the last 168 hours. Coagulation Profile: Recent Labs  Lab 08/27/20 1503  INR 1.0   Cardiac Enzymes: No results for input(s): CKTOTAL, CKMB, CKMBINDEX, TROPONINI in the last 168 hours. BNP (last 3 results) No results for input(s): PROBNP in the last 8760 hours. HbA1C: No results for input(s): HGBA1C in the last 72 hours. CBG: No results for input(s): GLUCAP in the last 168 hours. Lipid Profile: No results for input(s): CHOL, HDL,  LDLCALC, TRIG, CHOLHDL, LDLDIRECT in the last 72 hours. Thyroid Function Tests: No results for input(s): TSH, T4TOTAL, FREET4, T3FREE, THYROIDAB in the last 72 hours. Anemia Panel: No results for input(s): VITAMINB12, FOLATE, FERRITIN, TIBC, IRON, RETICCTPCT in the last 72 hours.    Radiology Studies: I have reviewed all of the imaging during this hospital visit personally     Scheduled Meds: . acetaminophen  500 mg Oral Q6H  . atorvastatin  80 mg Oral q1800  . docusate sodium  100 mg Oral BID  . feeding supplement  237 mL Oral BID BM  . metoprolol tartrate  12.5 mg Oral BID   Continuous Infusions: . sodium chloride 75 mL/hr at 08/29/20 1831  .  ceFAZolin (ANCEF) IV       LOS: 2 days        Gilma Bessette , MD

## 2020-08-29 NOTE — Anesthesia Preprocedure Evaluation (Addendum)
Anesthesia Evaluation  Patient identified by MRN, date of birth, ID band Patient awake    Reviewed: Allergy & Precautions, H&P , NPO status , Patient's Chart, lab work & pertinent test results  Airway Mallampati: II  TM Distance: >3 FB Neck ROM: Full    Dental no notable dental hx.    Pulmonary neg pulmonary ROS,    Pulmonary exam normal breath sounds clear to auscultation       Cardiovascular hypertension, + CAD and + Past MI  Normal cardiovascular exam Rhythm:Regular Rate:Normal     Neuro/Psych Dementia negative neurological ROS     GI/Hepatic negative GI ROS, Neg liver ROS,   Endo/Other  negative endocrine ROS  Renal/GU negative Renal ROS  negative genitourinary   Musculoskeletal negative musculoskeletal ROS (+)   Abdominal   Peds negative pediatric ROS (+)  Hematology negative hematology ROS (+)   Anesthesia Other Findings   Reproductive/Obstetrics negative OB ROS                             Anesthesia Physical Anesthesia Plan  ASA: III  Anesthesia Plan: General   Post-op Pain Management:    Induction: Intravenous  PONV Risk Score and Plan: 3 and Ondansetron, Dexamethasone and Treatment may vary due to age or medical condition  Airway Management Planned: Oral ETT  Additional Equipment:   Intra-op Plan:   Post-operative Plan: Extubation in OR  Informed Consent: I have reviewed the patients History and Physical, chart, labs and discussed the procedure including the risks, benefits and alternatives for the proposed anesthesia with the patient or authorized representative who has indicated his/her understanding and acceptance.     Dental advisory given  Plan Discussed with: CRNA and Surgeon  Anesthesia Plan Comments: (covid positive 2 weeks ago)        Anesthesia Quick Evaluation

## 2020-08-29 NOTE — Anesthesia Postprocedure Evaluation (Signed)
Anesthesia Post Note  Patient: Warehouse manager  Procedure(s) Performed: CANNULATED HIP PINNING (Right Hip)     Patient location during evaluation: PACU Anesthesia Type: General Level of consciousness: awake and alert Pain management: pain level controlled Vital Signs Assessment: post-procedure vital signs reviewed and stable Respiratory status: spontaneous breathing, nonlabored ventilation, respiratory function stable and patient connected to nasal cannula oxygen Cardiovascular status: blood pressure returned to baseline and stable Postop Assessment: no apparent nausea or vomiting Anesthetic complications: no   No complications documented.  Last Vitals:  Vitals:   08/29/20 1537 08/29/20 1545  BP: (!) 156/72 (!) 145/73  Pulse:    Resp: 15   Temp:    SpO2: 96% 98%    Last Pain:  Vitals:   08/29/20 1515  TempSrc:   PainSc: Asleep                 Tahesha Skeet S

## 2020-08-29 NOTE — Discharge Instructions (Signed)
Diet: As you were doing prior to hospitalization   Shower:  May shower but keep the wounds dry, use an occlusive plastic wrap, NO SOAKING IN TUB.  If the bandage gets wet, change with a clean dry gauze.  If you have a splint on, leave the splint in place and keep the splint dry with a plastic bag.  Dressing:  You may change your dressing 3-5 days after surgery, unless you have a splint.  If you have a splint, then just leave the splint in place and we will change your bandages during your first follow-up appointment.    If you had hand or foot surgery, we will plan to remove your stitches in about 2 weeks in the office.  For all other surgeries, there are sticky tapes (steri-strips) on your wounds and all the stitches are absorbable.  Leave the steri-strips in place when changing your dressings, they will peel off with time, usually 2-3 weeks.  Activity:  Increase activity slowly as tolerated, but follow the weight bearing instructions below.  The rules on driving is that you can not be taking narcotics while you drive, and you must feel in control of the vehicle.    Weight Bearing:   Can weight bear as tolerated with right leg  To prevent constipation: you may use a stool softener such as -  Colace (over the counter) 100 mg by mouth twice a day  Drink plenty of fluids (prune juice may be helpful) and high fiber foods Miralax (over the counter) for constipation as needed.    Itching:  If you experience itching with your medications, try taking only a single pain pill, or even half a pain pill at a time.  You may take up to 10 pain pills per day, and you can also use benadryl over the counter for itching or also to help with sleep.   Precautions:  If you experience chest pain or shortness of breath - call 911 immediately for transfer to the hospital emergency department!!  If you develop a fever greater that 101 F, purulent drainage from wound, increased redness or drainage from wound, or calf  pain -- Call the office at [phone]                                                Follow- Up Appointment:  Please call for an appointment to be seen in 2 weeks [place] - [phone]

## 2020-08-29 NOTE — Transfer of Care (Signed)
Immediate Anesthesia Transfer of Care Note  Patient: Tiffany Mack  Procedure(s) Performed: CANNULATED HIP PINNING (Right Hip)  Patient Location: PACU  Anesthesia Type:General  Level of Consciousness: drowsy, confused and responds to stimulation  Airway & Oxygen Therapy: Patient Spontanous Breathing and Patient connected to face mask oxygen  Post-op Assessment: Report given to RN and Post -op Vital signs reviewed and stable  Post vital signs: Reviewed and stable  Last Vitals:  Vitals Value Taken Time  BP    Temp    Pulse    Resp    SpO2      Last Pain:  Vitals:   08/28/20 2300  TempSrc:   PainSc: Asleep         Complications: No complications documented.

## 2020-08-29 NOTE — Op Note (Signed)
08/27/2020 - 08/29/2020  2:27 PM  PATIENT:  Tiffany Mack    PRE-OPERATIVE DIAGNOSIS:  Valgus impacted nondisplaced right femoral neck fracture  POST-OPERATIVE DIAGNOSIS:  Same  PROCEDURE:  RIGHT CANNULATED HIP PINNING  SURGEON:  Eulas Post, MD  PHYSICIAN ASSISTANT: Janine Ores, PA-C,  present and scrubbed throughout the case, critical for completion in a timely fashion, and for retraction, instrumentation, and closure.  ANESTHESIA:   General  ESTIMATED BLOOD LOSS: Minimal.  PREOPERATIVE INDICATIONS:  [name] is a  80 y.o. female who fell and was found to have a diagnosis of FRACTURED RIGHT HIP who elected for surgical management.  She also had a subarachnoid hemorrhage and tested positive for COVID.  The risks benefits and alternatives were discussed with the patient preoperatively including but not limited to the risks of infection, bleeding, nerve injury, cardiopulmonary complications, blood clots, malunion, nonunion, avascular necrosis, the need for revision surgery, the potential for conversion to hemiarthroplasty, among others, and the patient was willing to proceed.  OPERATIVE IMPLANTS: 6.5 mm cannulated screws x 3 from [employer].  OPERATIVE FINDINGS: Clinical osteoporosis with weak bone, proximal femur  OPERATIVE PROCEDURE: The patient was brought to the operating room and placed in supine position. IV antibiotics were given. General anesthesia administered. The patient was placed on the fracture table. The operative extremity was positioned, without any significant reduction maneuver and was prepped and draped in usual sterile fashion.  Time out was performed.  Small incision was made distal to the greater trochanter, and 3 guidewires were introduced Into an inverted triangle configuration. The lengths were measured. The reduction was slightly valgus, and near-anatomic. I opened the cortex with a cannulated drill, and then placed the screws into position.  Satisfactory fixation was achieved.  The wounds were irrigated copiously, and repaired with Vicryl with Steri-Strips and sterile gauze. There no complications and the patient tolerated the procedure well.  The patient will be weightbearing as tolerated, and will be on Lovenox for a period of 4 weeks after discharge for DVT prophylaxis.

## 2020-08-29 NOTE — Plan of Care (Signed)
  Problem: Safety: Goal: Ability to remain free from injury will improve Outcome: Progressing   Problem: Skin Integrity: Goal: Risk for impaired skin integrity will decrease Outcome: Progressing   Problem: Pain Management: Goal: Pain level will decrease Outcome: Progressing   

## 2020-08-29 NOTE — Progress Notes (Signed)
The patient will be re-examined, and the chart reviewed, and there have been no interval changes to the documented history and physical.  Patient going straight back to the OR room.  The risks, benefits, and alternatives have been discussed at length, and the patient and family is willing to proceed.     Eulas Post, MD

## 2020-08-30 DIAGNOSIS — F0281 Dementia in other diseases classified elsewhere with behavioral disturbance: Secondary | ICD-10-CM

## 2020-08-30 DIAGNOSIS — G3183 Dementia with Lewy bodies: Secondary | ICD-10-CM | POA: Diagnosis not present

## 2020-08-30 DIAGNOSIS — I2510 Atherosclerotic heart disease of native coronary artery without angina pectoris: Secondary | ICD-10-CM | POA: Diagnosis not present

## 2020-08-30 DIAGNOSIS — I609 Nontraumatic subarachnoid hemorrhage, unspecified: Secondary | ICD-10-CM | POA: Diagnosis not present

## 2020-08-30 DIAGNOSIS — S72001A Fracture of unspecified part of neck of right femur, initial encounter for closed fracture: Secondary | ICD-10-CM | POA: Diagnosis not present

## 2020-08-30 LAB — CBC
HCT: 43.5 % (ref 36.0–46.0)
Hemoglobin: 14 g/dL (ref 12.0–15.0)
MCH: 28.7 pg (ref 26.0–34.0)
MCHC: 32.2 g/dL (ref 30.0–36.0)
MCV: 89.3 fL (ref 80.0–100.0)
Platelets: 197 10*3/uL (ref 150–400)
RBC: 4.87 MIL/uL (ref 3.87–5.11)
RDW: 12.5 % (ref 11.5–15.5)
WBC: 9.7 10*3/uL (ref 4.0–10.5)
nRBC: 0 % (ref 0.0–0.2)

## 2020-08-30 LAB — BASIC METABOLIC PANEL
Anion gap: 12 (ref 5–15)
BUN: 10 mg/dL (ref 8–23)
CO2: 25 mmol/L (ref 22–32)
Calcium: 8.9 mg/dL (ref 8.9–10.3)
Chloride: 103 mmol/L (ref 98–111)
Creatinine, Ser: 0.65 mg/dL (ref 0.44–1.00)
GFR, Estimated: >60 mL/min (ref >60)
Glucose, Bld: 142 mg/dL — ABNORMAL HIGH (ref 70–99)
Potassium: 3.5 mmol/L (ref 3.5–5.1)
Sodium: 140 mmol/L (ref 135–145)

## 2020-08-30 MED ORDER — PANTOPRAZOLE SODIUM 40 MG PO TBEC
40.0000 mg | DELAYED_RELEASE_TABLET | Freq: Every day | ORAL | Status: DC
  Administered 2020-08-31 – 2020-09-04 (×5): 40 mg via ORAL
  Filled 2020-08-30 (×5): qty 1

## 2020-08-30 MED ORDER — VENLAFAXINE HCL ER 37.5 MG PO CP24
37.5000 mg | ORAL_CAPSULE | Freq: Every day | ORAL | Status: DC
  Administered 2020-08-31 – 2020-09-04 (×5): 37.5 mg via ORAL
  Filled 2020-08-30 (×5): qty 1

## 2020-08-30 MED ORDER — MEMANTINE HCL 10 MG PO TABS
10.0000 mg | ORAL_TABLET | Freq: Every day | ORAL | Status: DC
  Administered 2020-08-30 – 2020-09-03 (×5): 10 mg via ORAL
  Filled 2020-08-30 (×5): qty 1

## 2020-08-30 MED ORDER — ENOXAPARIN SODIUM 40 MG/0.4ML ~~LOC~~ SOLN
40.0000 mg | Freq: Every day | SUBCUTANEOUS | Status: DC
  Administered 2020-08-30 – 2020-09-04 (×6): 40 mg via SUBCUTANEOUS
  Filled 2020-08-30 (×6): qty 0.4

## 2020-08-30 MED ORDER — RIVASTIGMINE 13.3 MG/24HR TD PT24
13.3000 mg | MEDICATED_PATCH | TRANSDERMAL | Status: DC
  Administered 2020-08-30 – 2020-09-03 (×5): 13.3 mg via TRANSDERMAL
  Filled 2020-08-30 (×5): qty 1

## 2020-08-30 NOTE — Clinical Social Work Note (Addendum)
Called [facility] at [phone] and asked the receptionist to send over proof of positive COVID test from 2 weeks ago.  She confirmed FAX number and stated it would be sent today.   Addendum: FAX received.  MD alerted

## 2020-08-30 NOTE — Evaluation (Signed)
Physical Therapy Evaluation Patient Details Name: Tiffany Mack MRN: [id number] DOB: 18-Aug-1940 Today's Date: 08/30/2020   History of Present Illness  80 y.o. female with PMH of CAD, dementia, hospitalization for covid PNA and UTI with DC 07/26/19 admitted from memory care unit with fall, R femoral neck fx, s/p pinning 08/29/20.  Clinical Impression  Pt admitted with above diagnosis. +2 max assist for supine to sit, pt ambulated 16' with hand held assist of 2 people. Attempt was made to use RW, however pt would not place her hands on it. She is oriented to self only.  Pt currently with functional limitations due to the deficits listed below (see PT Problem List). Pt will benefit from skilled PT to increase their independence and safety with mobility to allow discharge to the venue listed below.       Follow Up Recommendations SNF;Supervision/Assistance - 24 hour;Supervision for mobility/OOB    Equipment Recommendations  Rolling walker with 5" wheels    Recommendations for Other Services       Precautions / Restrictions Precautions Precautions: Fall Restrictions Weight Bearing Restrictions: No RLE Weight Bearing: Weight bearing as tolerated      Mobility  Bed Mobility Overal bed mobility: Needs Assistance Bed Mobility: Supine to Sit     Supine to sit: Max assist;+2 for physical assistance     General bed mobility comments: assist to raise trunk and pivot hips to edge of bed, pt 25%    Transfers Overall transfer level: Needs assistance Equipment used: Rolling walker (2 wheeled);2 person hand held assist Transfers: Sit to/from Stand Sit to Stand: Mod assist         General transfer comment: Attempted sit to stand with RW however pt would not place her hands on the RW so switched to 2 person hand held assist, mod A to power up  Ambulation/Gait Ambulation/Gait assistance: Min assist;+2 physical assistance Gait Distance (Feet): 16 Feet Assistive device: 2 person  hand held assist Gait Pattern/deviations: Step-through pattern;Decreased stride length;Narrow base of support;Decreased weight shift to right Gait velocity: decr   General Gait Details: Min A of 2 for balance  Stairs            Wheelchair Mobility    Modified Rankin (Stroke Patients Only)       Balance Overall balance assessment: Needs assistance Sitting-balance support: Feet supported;Single extremity supported Sitting balance-Leahy Scale: Fair     Standing balance support: Bilateral upper extremity supported Standing balance-Leahy Scale: Poor Standing balance comment: relies on BUE support                             Pertinent Vitals/Pain Pain Assessment: Faces Faces Pain Scale: Hurts little more Pain Location: R hip with movement Pain Descriptors / Indicators: Grimacing;Guarding Pain Intervention(s): Premedicated before session;Repositioned    Home Living Family/patient expects to be discharged to:: Skilled nursing facility                      Prior Function Level of Independence: Independent         Comments: independent with mobility, had assistance for bathing/dressing     Hand Dominance        Extremity/Trunk Assessment   Upper Extremity Assessment Upper Extremity Assessment: Overall WFL for tasks assessed    Lower Extremity Assessment Lower Extremity Assessment: RLE deficits/detail RLE Deficits / Details: tolerated AAROM to RLE, hip flexion to ~35*, hip ABDuction 15* AAROM, able to actively  DF/PF ankles RLE Sensation: WNL    Cervical / Trunk Assessment Cervical / Trunk Assessment: Normal  Communication   Communication: No difficulties  Cognition Arousal/Alertness: Awake/alert Behavior During Therapy: WFL for tasks assessed/performed Overall Cognitive Status: History of cognitive impairments - at baseline                                 General Comments: h/o dementia, pt reports seeing a man under  the bed      General Comments      Exercises General Exercises - Lower Extremity Ankle Circles/Pumps: AROM;Both;10 reps;Supine Heel Slides: AAROM;Right;Supine;5 reps Hip ABduction/ADduction: AAROM;Right;Supine;5 reps   Assessment/Plan    PT Assessment Patient needs continued PT services  PT Problem List Decreased mobility;Decreased activity tolerance       PT Treatment Interventions Therapeutic activities;Therapeutic exercise;Functional mobility training;Patient/family education    PT Goals (Current goals can be found in the Care Plan section)  Acute Rehab PT Goals PT Goal Formulation: Patient unable to participate in goal setting Time For Goal Achievement: 09/13/20 Potential to Achieve Goals: Fair    Frequency Min 3X/week   Barriers to discharge        Co-evaluation               AM-PAC PT "6 Clicks" Mobility  Outcome Measure Help needed turning from your back to your side while in a flat bed without using bedrails?: A Lot Help needed moving from lying on your back to sitting on the side of a flat bed without using bedrails?: A Lot Help needed moving to and from a bed to a chair (including a wheelchair)?: A Lot Help needed standing up from a chair using your arms (e.g., wheelchair or bedside chair)?: A Lot Help needed to walk in hospital room?: A Lot Help needed climbing 3-5 steps with a railing? : Total 6 Click Score: 11    End of Session Equipment Utilized During Treatment: Gait belt Activity Tolerance: Patient tolerated treatment well Patient left: in chair;with call bell/phone within reach;with chair alarm set;with family/visitor present Nurse Communication: Mobility status PT Visit Diagnosis: Difficulty in walking, not elsewhere classified (R26.2)    Time: 1914-7829 PT Time Calculation (min) (ACUTE ONLY): 22 min   Charges:   PT Evaluation $PT Eval Moderate Complexity: 1 Mod         Tamala Ser PT 08/30/2020  Acute  Rehabilitation Services Pager [phone] Office [phone]

## 2020-08-30 NOTE — TOC Initial Note (Signed)
Transition of Care [facility]) - Initial/Assessment Note    Patient Details  Name: Tiffany Mack MRN: [id number] Date of Birth: 1940/10/15  Transition of Care [facility]) CM/SW Contact:    Ida Rogue, LCSW Phone Number: 08/30/2020, 3:11 PM  Clinical Narrative:  Ms Jeancharles is a resident of [facility] care, where she has been for the past year. She recently fell, broke her hip, and is now in need of SNF placement for short term rehab.  She was diagnosed with COVID over 2 weeks ago, so will be able to come off of precautions.  As such, sent out to all local SNF's.  Spoke with son and went over search process with him, will keep him in the loop. Bed search initiated. TOC will continue to follow during the course of hospitalization.                  Expected Discharge Plan: Skilled Nursing Facility Barriers to Discharge: SNF Pending bed offer   Patient Goals and CMS Choice     Choice offered to / list presented to : Adult Children  Expected Discharge Plan and Services Expected Discharge Plan: Skilled Nursing Facility   Discharge Planning Services: CM Consult Post Acute Care Choice: Skilled Nursing Facility Living arrangements for the past 2 months: Assisted Living Facility                                      Prior Living Arrangements/Services Living arrangements for the past 2 months: Assisted Living Facility Lives with:: Facility Resident Patient language and need for interpreter reviewed:: Yes        Need for Family Participation in Patient Care: Yes (Comment) Care giver support system in place?: Yes (comment)   Criminal Activity/Legal Involvement Pertinent to Current Situation/Hospitalization: No - Comment as needed  Activities of Daily Living Home Assistive Devices/Equipment: Other (Comment) (unable to asses) ADL Screening (condition at time of admission) Patient's cognitive ability adequate to safely complete daily activities?: No Is the patient  deaf or have difficulty hearing?: No Does the patient have difficulty seeing, even when wearing glasses/contacts?: No Does the patient have difficulty concentrating, remembering, or making decisions?: Yes Patient able to express need for assistance with ADLs?: No Does the patient have difficulty dressing or bathing?: Yes Independently performs ADLs?: No Communication: Needs assistance Is this a change from baseline?: Pre-admission baseline Dressing (OT): Needs assistance Is this a change from baseline?: Pre-admission baseline Grooming: Needs assistance Is this a change from baseline?: Pre-admission baseline Feeding: Needs assistance Is this a change from baseline?: Pre-admission baseline Bathing: Needs assistance Is this a change from baseline?: Pre-admission baseline Toileting: Needs assistance Is this a change from baseline?: Pre-admission baseline In/Out Bed: Needs assistance Is this a change from baseline?: Pre-admission baseline Walks in Home: Needs assistance Is this a change from baseline?: Pre-admission baseline Does the patient have difficulty walking or climbing stairs?: Yes Weakness of Legs: Right Weakness of Arms/Hands: None  Permission Sought/Granted Permission sought to share information with : Family Supports Permission granted to share information with : No  Share Information with NAME: Jule Ser (Son)   [phone]           Emotional Assessment       Orientation: : Oriented to Self Alcohol / Substance Use: Not Applicable Psych Involvement: No (comment)  Admission diagnosis:  Hip fracture (HCC) [S72.009A] Subarachnoid bleed (HCC) [I60.9] Hematoma of scalp, initial  encounter [S00.03XA] Closed fracture of right hip, initial encounter (HCC) [S72.001A] Traumatic brain injury with loss of consciousness, initial encounter [facility]) [S06.9X9A] Patient Active Problem List   Diagnosis Date Noted  . Hip fracture (HCC) 08/27/2020  . Subarachnoid hemorrhage  (HCC) 08/27/2020  . Pneumonia due to COVID-19 virus 07/28/2019  . Acute cystitis without hematuria   . Acute encephalopathy 07/26/2019  . Pressure injury of skin 07/26/2019  . CAP (community acquired pneumonia) 07/26/2019  . Hypokalemia 07/26/2019  . Acute lower UTI 07/26/2019  . Depression 07/26/2019  . Malnutrition of moderate degree 03/24/2018  . 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 Vomiting 03/21/2018  . HTN (hypertension) 03/21/2018  . Dementia (HCC) 03/21/2018  . CAD (coronary artery disease) 03/21/2018  . Acute non Q wave MI (myocardial infarction), initial episode of care (HCC) 08/04/2015   PCP:  Mila Palmer, MD Pharmacy:   [facility] - [place] - [address] [place] [address] Phone: [phone] Fax: [phone]  [id number] - [place], [place] - [address] [place] [address] Phone: [phone] Fax: [phone]  [facility], [place] - [address] [address] [place] [address] Phone: [phone] Fax: [phone]     Social Determinants of Health (SDOH) Interventions    Readmission Risk Interventions No flowsheet data found.

## 2020-08-30 NOTE — Progress Notes (Signed)
Orthopedic Tech Progress Note Patient Details:  Tiffany Mack 07/31/1940 [id number]  Patient ID: [name], female   DOB: [birth year], 80 y.o.   MRN: [id number]   [name] 08/30/2020, 6:55 AMPt can not use Trapeze bar

## 2020-08-30 NOTE — Progress Notes (Addendum)
PROGRESS NOTE    Tiffany Mack  VZD:[id number] DOB: 1941/04/23 DOA: 08/27/2020 PCP: Mila Palmer, MD    Brief Narrative:  Tiffany Mack is a 80 year old female with past medical history significant for dementia, CAD, essential hypertension who presented from her memory care unit with hip pain and head bruise following mechanical fall.  Patient was in her normal state of health when she was trying to tie her shoes and fell forward out of her chair onto her head.  In the ED, CT notable for right frontal intraparenchymal and subarachnoid hemorrhage without mass-effect, x-ray right hip with femoral neck fracture.  Case was discussed with neurosurgery, Dr. Mickle Mallory guard and orthopedics Dr. Dion Saucier.  Hospital service consulted for further evaluation and management of acute right hip fracture and right frontal intraparenchymal/SAH.   Assessment & Plan:   Principal Problem:   Hip fracture (HCC) Active Problems:   HTN (hypertension)   Dementia (HCC)   CAD (coronary artery disease)   Malnutrition of moderate degree   Subarachnoid hemorrhage (HCC)   Right hip fracture Patient presenting from memory care unit following mechanical fall with right hip pain.  X-ray right hip notable for right hip fracture.  Patient underwent right hip pinning by orthopedics, Dr. Dion Saucier on 08/29/2020. --Weightbearing as tolerates --Lovenox for DVT prophylaxis x4 weeks per Ortho --Tylenol and tramadol prn for pain control --Continue PT/OT efforts while inpatient --2-week follow-up with orthopedics, Dr. Dion Saucier --Pending SNF placement  Subarachnoid hemorrhage Intraparenchymal hemorrhage CT head notable for right frontal intraparenchymal and subarachnoid hemorrhage without mass-effect.  These are small and patient is asymptomatic.  Case was discussed with neurosurgery on-call, Dr. Maurice Small; and no further imaging required unless mental status changes.  Okay for Lovenox DVT prophylaxis postoperatively after  24 hours since injury.  Recent COVID viral infection Patient asymptomatic, originally diagnosed on 08/11/2020.  No need for treatment, oxygenating well on room air.  No further isolation required.  Coronary artery disease Essential hypertension --Atorvastatin 80 mg p.o. daily --Metoprolol tartrate 12.5 mg p.o. twice daily  Dementia --Continue rivastigmine patch, venlafaxine 37.5 mg p.o. daily, Namenda --Delirium precautions --Get up during the day --Encourage a familiar face to remain present throughout the day --Keep blinds open and lights on during daylight hours --Minimize the use of opioids/benzodiazepines  GERD: Continue Protonix 40 mg p.o. daily  DVT prophylaxis: Lovenox   Code Status: DNR Family Communication: Updated patient's son who is present at bedside  Disposition Plan:  Level of care: Med-Surg Status is: Inpatient  Remains inpatient appropriate because:Unsafe d/c plan   Dispo: The patient is from: ALF              Anticipated d/c is to: SNF              Anticipated d/c date is: 1 day              Patient currently is medically stable to d/c.   Difficult to place patient No    Consultants:   Orthopedics, Dr. Dion Saucier  Neurosurgery, Dr. Maurice Small  Procedures:   Right hip pinning, Dr. Dion Saucier 08/29/2020  Antimicrobials:   Perioperative cefazolin   Subjective: Patient seen and examined bedside, resting comfortably.  Sitting in bedside chair.  Son present.  Pleasantly confused.  No specific complaints this morning.  Awaiting placement at SNF.  Denies headache, no chest pain, palpitations, no shortness of breath, no abdominal pain.  No acute events overnight per nursing staff.  Objective: Vitals:   08/29/20 1819 08/29/20 2025 08/30/20 2951  08/30/20 1306  BP: (!) 156/89 (!) 144/64 111/82 110/61  Pulse: 97 (!) 105 82 66  Resp: 16 18 20 18   Temp: 98.1 F (36.7 C) (!) 97.5 F (36.4 C) 97.8 F (36.6 C) 97.6 F (36.4 C)  TempSrc: Oral Oral Axillary  Axillary  SpO2: 97% 100% 100% 100%  Weight:      Height:        Intake/Output Summary (Last 24 hours) at 08/30/2020 1810 Last data filed at 08/30/2020 1300 Gross per 24 hour  Intake 730.64 ml  Output 900 ml  Net -169.36 ml   Filed Weights   08/27/20 2015  Weight: 72.4 kg    Examination:  General exam: Appears calm and comfortable, pleasantly confused Respiratory system: Clear to auscultation. Respiratory effort normal.  Oxygen well on room air Cardiovascular system: S1 & S2 heard, RRR. No JVD, murmurs, rubs, gallops or clicks. No pedal edema. Gastrointestinal system: Abdomen is nondistended, soft and nontender. No organomegaly or masses felt. Normal bowel sounds heard. Central nervous system: Alert, not oriented to person/place/time/situation. No focal neurological deficits. Extremities: Symmetric 5 x 5 power. Skin: No rashes, lesions or ulcers Psychiatry: Judgement and insight appear poor. Mood & affect appropriate.     Data Reviewed: I have personally reviewed following labs and imaging studies  CBC: Recent Labs  Lab 08/27/20 1503 08/28/20 0340 08/29/20 0332 08/30/20 0403  WBC 13.7* 9.9 10.4 9.7  NEUTROABS 10.8*  --  7.8*  --   HGB 14.1 14.1 14.2 14.0  HCT 43.5 45.0 43.8 43.5  MCV 90.1 91.3 89.2 89.3  PLT 221 202 211 197   Basic Metabolic Panel: Recent Labs  Lab 08/27/20 1503 08/28/20 0340 08/29/20 0332 08/30/20 0403  NA 144 140 140 140  K 3.6 3.8 3.4* 3.5  CL 105 101 100 103  CO2 30 28 29 25   GLUCOSE 117* 105* 125* 142*  BUN 14 11 8 10   CREATININE 0.69 0.56 0.61 0.65  CALCIUM 9.4 9.3 9.3 8.9   GFR: Estimated Creatinine Clearance: 59.6 mL/min (by C-G formula based on SCr of 0.65 mg/dL). Liver Function Tests: No results for input(s): AST, ALT, ALKPHOS, BILITOT, PROT, ALBUMIN in the last 168 hours. No results for input(s): LIPASE, AMYLASE in the last 168 hours. No results for input(s): AMMONIA in the last 168 hours. Coagulation Profile: Recent Labs   Lab 08/27/20 1503  INR 1.0   Cardiac Enzymes: No results for input(s): CKTOTAL, CKMB, CKMBINDEX, TROPONINI in the last 168 hours. BNP (last 3 results) No results for input(s): PROBNP in the last 8760 hours. HbA1C: No results for input(s): HGBA1C in the last 72 hours. CBG: No results for input(s): GLUCAP in the last 168 hours. Lipid Profile: No results for input(s): CHOL, HDL, LDLCALC, TRIG, CHOLHDL, LDLDIRECT in the last 72 hours. Thyroid Function Tests: No results for input(s): TSH, T4TOTAL, FREET4, T3FREE, THYROIDAB in the last 72 hours. Anemia Panel: No results for input(s): VITAMINB12, FOLATE, FERRITIN, TIBC, IRON, RETICCTPCT in the last 72 hours. Sepsis Labs: No results for input(s): PROCALCITON, LATICACIDVEN in the last 168 hours.  Recent Results (from the past 240 hour(s))  SARS Coronavirus 2 by RT PCR (hospital order, performed in [facility] hospital lab) Nasopharyngeal Nasopharyngeal Swab     Status: Abnormal   Collection Time: 08/27/20  4:08 PM   Specimen: Nasopharyngeal Swab  Result Value Ref Range Status   SARS Coronavirus 2 POSITIVE (A) NEGATIVE Final    Comment: RESULT CALLED TO, READ BACK BY AND VERIFIED WITH: GARRISON,  G RN @1800  ON 08/27/20 JACKSON,K (NOTE) SARS-CoV-2 target nucleic acids are DETECTED  SARS-CoV-2 RNA is generally detectable in upper respiratory specimens  during the acute phase of infection.  Positive results are indicative  of the presence of the identified virus, but do not rule out bacterial infection or co-infection with other pathogens not detected by the test.  Clinical correlation with patient history and  other diagnostic information is necessary to determine patient infection status.  The expected result is negative.  Fact Sheet for Patients:   08/29/20   Fact Sheet for Healthcare Providers:   BoilerBrush.com.cy    This test is not yet approved or cleared by the https://pope.com/ FDA and  has been authorized for detection and/or diagnosis of SARS-CoV-2 by FDA under an Emergency Use Authorization (EUA).  This EUA will remain in effect (meaning  this test can be used) for the duration of  the COVID-19 declaration under Section 564(b)(1) of the Act, 21 U.S.C. section 360-bbb-3(b)(1), unless the authorization is terminated or revoked sooner.  Performed at [facility], [address] [place], [place]          Radiology Studies: Pelvis Portable  Result Date: 08/29/2020 CLINICAL DATA:  Postop hip pinning today. EXAM: PORTABLE PELVIS 1-2 VIEWS COMPARISON:  Intraoperative fluoroscopic images earlier today. Right hip radiographs 08/27/2020. FINDINGS: Three cannulated screws are again noted traversing the right femoral neck and terminating in the femoral head for treatment of a nondisplaced femoral neck fracture. Alignment appears anatomic. The femoral heads are approximated with the acetabula on this single projection. No new fracture is identified. IMPRESSION: Status post right hip pinning. Electronically Signed   By: 08/29/2020 M.D.   On: 08/29/2020 16:20   DG C-Arm 1-60 Min-No Report  Result Date: 08/29/2020 Fluoroscopy was utilized by the requesting physician.  No radiographic interpretation.   DG Hip Port Unilat With Pelvis 1V Right  Result Date: 08/29/2020 CLINICAL DATA:  80 year old female status post right hip pinning. EXAM: DG HIP (WITH OR WITHOUT PELVIS) 1V PORT RIGHT COMPARISON:  Earlier intraoperative fluoroscopic image dated 08/29/2020. FINDINGS: Single cross-table lateral view provided. Evaluation is very limited due to soft tissue attenuation. Three fixation pins noted in the right femoral neck. IMPRESSION: Status post pinning of the right femoral neck. Electronically Signed   By: 10/27/2020 M.D.   On: 08/29/2020 16:09   DG HIP OPERATIVE UNILAT W OR W/O PELVIS RIGHT  Result Date: 08/29/2020 CLINICAL DATA:   Elective surgery. EXAM: OPERATIVE RIGHT HIP (WITH PELVIS IF PERFORMED) 3 VIEWS TECHNIQUE: Fluoroscopic spot image(s) were submitted for interpretation post-operatively. COMPARISON:  Right hip radiographs 08/27/2020 FINDINGS: Three intraoperative spot fluoroscopic images demonstrate placement of 3 screws for fixation of the previously demonstrated impacted, nondisplaced femoral neck fracture. All 3 screws terminate within the margins of the femoral head. There is no dislocation. IMPRESSION: Intraoperative images during right hip pinning. Electronically Signed   By: 08/29/2020 M.D.   On: 08/29/2020 15:43        Scheduled Meds: . atorvastatin  80 mg Oral q1800  . docusate sodium  100 mg Oral BID  . enoxaparin (LOVENOX) injection  40 mg Subcutaneous Daily  . feeding supplement  237 mL Oral BID BM  . metoprolol tartrate  12.5 mg Oral BID   Continuous Infusions: . sodium chloride 75 mL/hr at 08/30/20 0555     LOS: 3 days    Time spent: 38 minutes spent on chart review, discussion with nursing  staff, consultants, updating family and interview/physical exam; more than 50% of that time was spent in counseling and/or coordination of care.    Alvira Philips [place], DO [facility] Available via Epic secure chat 7am-7pm After these hours, please refer to coverage provider listed on [email or web address] 08/30/2020, 6:10 PM

## 2020-08-30 NOTE — NC FL2 (Signed)
[place] MEDICAID FL2 LEVEL OF CARE SCREENING TOOL     IDENTIFICATION  Patient Name: Tiffany Mack Birthdate: January 20, 1941 Sex: female Admission Date (Current Location): 08/27/2020  [facility] and [place] Number:  Producer, television/film/video and Address:  [facility],  [address], [place] [address]      Provider Number: [id number]  Attending Physician Name and Address:  [place], Eric J, DO  Relative Name and Phone Number:  Maree Krabbe)   [phone]    Current Level of Care: Hospital Recommended Level of Care: Skilled Nursing Facility Prior Approval Number:    Date Approved/Denied:   PASRR Number:  pending  Discharge Plan: SNF    Current Diagnoses: Patient Active Problem List   Diagnosis Date Noted  . Hip fracture (HCC) 08/27/2020  . Subarachnoid hemorrhage (HCC) 08/27/2020  . Pneumonia due to COVID-19 virus 07/28/2019  . Acute cystitis without hematuria   . Acute encephalopathy 07/26/2019  . Pressure injury of skin 07/26/2019  . CAP (community acquired pneumonia) 07/26/2019  . Hypokalemia 07/26/2019  . Acute lower UTI 07/26/2019  . Depression 07/26/2019  . Malnutrition of moderate degree 03/24/2018  . 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 Vomiting 03/21/2018  . HTN (hypertension) 03/21/2018  . Dementia (HCC) 03/21/2018  . CAD (coronary artery disease) 03/21/2018  . Acute non Q wave MI (myocardial infarction), initial episode of care (HCC) 08/04/2015    Orientation RESPIRATION BLADDER Height & Weight     Self  Normal External catheter Weight: 72.4 kg Height:  5\' 9"  (175.3 cm)  BEHAVIORAL SYMPTOMS/MOOD NEUROLOGICAL BOWEL NUTRITION STATUS   (none)  (none) Incontinent Diet (see d/c summary)  AMBULATORY STATUS COMMUNICATION OF NEEDS Skin   Extensive Assist Verbally  (Laceration to face, Surgical wound R Arm, anterior, lower; R Hip)                       Personal Care Assistance Level of Assistance  Bathing,Feeding,Dressing Bathing  Assistance: Maximum assistance Feeding assistance: Limited assistance Dressing Assistance: Limited assistance     Functional Limitations Info  Sight,Hearing,Speech Sight Info: Adequate Hearing Info: Adequate Speech Info: Adequate    SPECIAL CARE FACTORS FREQUENCY  PT (By licensed PT),OT (By licensed OT)     PT Frequency: 5X/W OT Frequency: 5X/W            Contractures Contractures Info: Not present    Additional Factors Info  Code Status,Allergies Code Status Info: DNR Allergies Info: NKA           Current Medications (08/30/2020):  This is the current hospital active medication list Current Facility-Administered Medications  Medication Dose Route Frequency Provider Last Rate Last Admin  . 0.45 % sodium chloride infusion   Intravenous Continuous 10/28/2020, PA-C 75 mL/hr at 08/30/20 0555 New Bag at 08/30/20 0555  . acetaminophen (TYLENOL) tablet 325-650 mg  325-650 mg Oral Q6H PRN 10/28/20 K, PA-C      . alum & mag hydroxide-simeth (MAALOX/MYLANTA) 200-200-20 MG/5ML suspension 30 mL  30 mL Oral Q4H PRN 02-04-2001 K, PA-C      . atorvastatin (LIPITOR) tablet 80 mg  80 mg Oral q1800 06-19-1989 K, PA-C   80 mg at 08/29/20 1821  . bisacodyl (DULCOLAX) suppository 10 mg  10 mg Rectal Daily PRN 10/27/20 K, PA-C      . docusate sodium (COLACE) capsule 100 mg  100 mg Oral BID Janine Ores K, PA-C   100 mg at 08/30/20 1022  .  enoxaparin (LOVENOX) injection 40 mg  40 mg Subcutaneous Daily Danford Bad, RPH   40 mg at 08/30/20 1022  . feeding supplement (ENSURE SURGERY) liquid 237 mL  237 mL Oral BID BM Janine Ores K, PA-C   237 mL at 08/30/20 1459  . magnesium citrate solution 1 Bottle  1 Bottle Oral Once PRN Janine Ores K, PA-C      . menthol-cetylpyridinium (CEPACOL) lozenge 3 mg  1 lozenge Oral PRN Janine Ores K, PA-C       Or  . phenol (CHLORASEPTIC) mouth spray 1 spray  1 spray Mouth/Throat PRN Janine Ores K, PA-C      . metoprolol tartrate  (LOPRESSOR) tablet 12.5 mg  12.5 mg Oral BID Janine Ores K, PA-C   12.5 mg at 08/30/20 1022  . ondansetron (ZOFRAN) tablet 4 mg  4 mg Oral Q6H PRN Janine Ores K, PA-C       Or  . ondansetron [facility]) injection 4 mg  4 mg Intravenous Q6H PRN Janine Ores K, PA-C      . polyethylene glycol (MIRALAX / GLYCOLAX) packet 17 g  17 g Oral Daily PRN Janine Ores K, PA-C      . traMADol Janean Sark) tablet 50 mg  50 mg Oral Q6H PRN Janine Ores K, PA-C   50 mg at 08/30/20 1023     Discharge Medications: Please see discharge summary for a list of discharge medications.  Relevant Imaging Results:  Relevant Lab Results:   Additional Information ss#[id number]  Ida Rogue, [place]

## 2020-08-30 NOTE — Progress Notes (Addendum)
Subjective: 1 Day Post-Op s/p Procedure(s): CANNULATED HIP PINNING   Patient is alert, sitting up in bed. Denies in pain this morning. Denies headache, chest pain, SOB, nausea/vomiting. No other complaints.  Objective:  PE: VITALS:   Vitals:   08/29/20 1545 08/29/20 1819 08/29/20 2025 08/30/20 0428  BP: (!) 145/73 (!) 156/89 (!) 144/64 111/82  Pulse:  97 (!) 105 82  Resp: 16 16 18 20   Temp:  98.1 F (36.7 C) (!) 97.5 F (36.4 C) 97.8 F (36.6 C)  TempSrc:  Oral Oral Axillary  SpO2: 98% 97% 100% 100%  Weight:      Height:       General: Alert, sitting up in bed GI: abdomen soft, non-tender MSK:  Neurovascular intact Sensation intact distally Intact pulses distally Dorsiflexion/Plantar flexion intact Incision: dressing C/D/I  LABS  Results for orders placed or performed during the hospital encounter of 08/27/20 (from the past 24 hour(s))  CBC     Status: None   Collection Time: 08/30/20  4:03 AM  Result Value Ref Range   WBC 9.7 4.0 - 10.5 K/uL   RBC 4.87 3.87 - 5.11 MIL/uL   Hemoglobin 14.0 12.0 - 15.0 g/dL   HCT 10/28/20 81.0 - 17.5 %   MCV 89.3 80.0 - 100.0 fL   MCH 28.7 26.0 - 34.0 pg   MCHC 32.2 30.0 - 36.0 g/dL   RDW 10.2 58.5 - 27.7 %   Platelets 197 150 - 400 K/uL   nRBC 0.0 0.0 - 0.2 %  Basic metabolic panel     Status: Abnormal   Collection Time: 08/30/20  4:03 AM  Result Value Ref Range   Sodium 140 135 - 145 mmol/L   Potassium 3.5 3.5 - 5.1 mmol/L   Chloride 103 98 - 111 mmol/L   CO2 25 22 - 32 mmol/L   Glucose, Bld 142 (H) 70 - 99 mg/dL   BUN 10 8 - 23 mg/dL   Creatinine, Ser 10/28/20 0.44 - 1.00 mg/dL   Calcium 8.9 8.9 - 2.35 mg/dL   GFR, Estimated 36.1 >44 mL/min   Anion gap 12 5 - 15    Pelvis Portable  Result Date: 08/29/2020 CLINICAL DATA:  Postop hip pinning today. EXAM: PORTABLE PELVIS 1-2 VIEWS COMPARISON:  Intraoperative fluoroscopic images earlier today. Right hip radiographs 08/27/2020. FINDINGS: Three cannulated screws are again  noted traversing the right femoral neck and terminating in the femoral head for treatment of a nondisplaced femoral neck fracture. Alignment appears anatomic. The femoral heads are approximated with the acetabula on this single projection. No new fracture is identified. IMPRESSION: Status post right hip pinning. Electronically Signed   By: 08/29/2020 M.D.   On: 08/29/2020 16:20   DG C-Arm 1-60 Min-No Report  Result Date: 08/29/2020 Fluoroscopy was utilized by the requesting physician.  No radiographic interpretation.   DG Hip Port Unilat With Pelvis 1V Right  Result Date: 08/29/2020 CLINICAL DATA:  80 year old female status post right hip pinning. EXAM: DG HIP (WITH OR WITHOUT PELVIS) 1V PORT RIGHT COMPARISON:  Earlier intraoperative fluoroscopic image dated 08/29/2020. FINDINGS: Single cross-table lateral view provided. Evaluation is very limited due to soft tissue attenuation. Three fixation pins noted in the right femoral neck. IMPRESSION: Status post pinning of the right femoral neck. Electronically Signed   By: 10/27/2020 M.D.   On: 08/29/2020 16:09   DG HIP OPERATIVE UNILAT W OR W/O PELVIS RIGHT  Result Date: 08/29/2020 CLINICAL DATA:  Elective surgery. EXAM:  OPERATIVE RIGHT HIP (WITH PELVIS IF PERFORMED) 3 VIEWS TECHNIQUE: Fluoroscopic spot image(s) were submitted for interpretation post-operatively. COMPARISON:  Right hip radiographs 08/27/2020 FINDINGS: Three intraoperative spot fluoroscopic images demonstrate placement of 3 screws for fixation of the previously demonstrated impacted, nondisplaced femoral neck fracture. All 3 screws terminate within the margins of the femoral head. There is no dislocation. IMPRESSION: Intraoperative images during right hip pinning. Electronically Signed   By: Sebastian Ache M.D.   On: 08/29/2020 15:43    Assessment/Plan: Right femoral neck fracture: 1 Day Post-Op s/p Procedure(s): CANNULATED HIP PINNING - doing well, no complaint of pain this  morning Weightbearing: WBAT RLE Insicional and dressing care: Reinforce dressings as needed VTE prophylaxis: ok to restart lovenox per neurosurgery  Pain control: tramadol, tylenol Follow - up plan: 2 weeks with Dr. Dion Saucier Dispo: pending PT and OT recommendations  Contact information:   Weekdays 8-5 Janine Ores, PA-C [phone] A fter hours and holidays please check [email or web address] for group call information for [facility]  Armida Sans 08/30/2020, 8:00 AM

## 2020-08-30 NOTE — Progress Notes (Signed)
ANTICOAGULATION CONSULT NOTE  Pharmacy Consult for enoxaparin Indication: VTE prophylaxis  No Known Allergies  Patient Measurements: Height: 5\' 9"  (175.3 cm) Weight: 72.4 kg (159 lb 9.8 oz) IBW/kg (Calculated) : 66.2  Vital Signs: Temp: 97.8 F (36.6 C) (02/02 0428) Temp Source: Axillary (02/02 0428) BP: 111/82 (02/02 0428) Pulse Rate: 82 (02/02 0428)  Labs: Recent Labs    08/27/20 1503 08/28/20 0340 08/29/20 0332 08/30/20 0403  HGB 14.1 14.1 14.2 14.0  HCT 43.5 45.0 43.8 43.5  PLT 221 202 211 197  LABPROT 12.5  --   --   --   INR 1.0  --   --   --   CREATININE 0.69 0.56 0.61 0.65    Estimated Creatinine Clearance: 59.6 mL/min (by C-G formula based on SCr of 0.65 mg/dL).   Medical History: Past Medical History:  Diagnosis Date  . Anxiety   . Coronary artery disease   . COVID-19 07/26/2019  . Dementia (HCC)     Medications:  Scheduled:  . acetaminophen  500 mg Oral Q6H  . atorvastatin  80 mg Oral q1800  . docusate sodium  100 mg Oral BID  . enoxaparin (LOVENOX) injection  40 mg Subcutaneous Daily  . feeding supplement  237 mL Oral BID BM  . metoprolol tartrate  12.5 mg Oral BID    Assessment: 80 y.o. female admitted 08/27/2020 after mechanical fall; sustained small SAH/ICH and  femoral neck fracture, now s/p cannulated pinning on 2/1. Pharmacy consulted to dose LMWH for VTE ppx.   Per hospitalist H&P 1/30, Neurology recommends: "Okay to use Lovenox 40 daily post-operatively (after 24 hours since injury, if stable)."   CBC: stable WNL  SCr: at baseline; stable  Previous anticoagulation: none  Goal of Therapy: Prevention of VTE  Plan:  Lovenox 40 mg SQ q24 hr to start AM POD1 (>72 hr since fall)  Pharmacy to sign off  2/30, PharmD, BCPS [phone] 08/30/2020, 8:27 AM

## 2020-08-30 NOTE — Care Management Important Message (Signed)
Important Message  Patient Details IM Letter placed in Patient's door Caddy. Name: Tiffany Mack MRN: [id number] Date of Birth: 10-Mar-1941   Medicare Important Message Given:  Yes     Caren Macadam 08/30/2020, 9:05 AM

## 2020-08-31 ENCOUNTER — Encounter (HOSPITAL_COMMUNITY): Payer: Self-pay | Admitting: Orthopedic Surgery

## 2020-08-31 DIAGNOSIS — G3183 Dementia with Lewy bodies: Secondary | ICD-10-CM | POA: Diagnosis not present

## 2020-08-31 DIAGNOSIS — I2510 Atherosclerotic heart disease of native coronary artery without angina pectoris: Secondary | ICD-10-CM | POA: Diagnosis not present

## 2020-08-31 DIAGNOSIS — S72001A Fracture of unspecified part of neck of right femur, initial encounter for closed fracture: Secondary | ICD-10-CM | POA: Diagnosis not present

## 2020-08-31 DIAGNOSIS — I609 Nontraumatic subarachnoid hemorrhage, unspecified: Secondary | ICD-10-CM | POA: Diagnosis not present

## 2020-08-31 LAB — BASIC METABOLIC PANEL
Anion gap: 12 (ref 5–15)
BUN: 11 mg/dL (ref 8–23)
CO2: 26 mmol/L (ref 22–32)
Calcium: 8.5 mg/dL — ABNORMAL LOW (ref 8.9–10.3)
Chloride: 103 mmol/L (ref 98–111)
Creatinine, Ser: 0.67 mg/dL (ref 0.44–1.00)
GFR, Estimated: >60 mL/min (ref >60)
Glucose, Bld: 114 mg/dL — ABNORMAL HIGH (ref 70–99)
Potassium: 3.3 mmol/L — ABNORMAL LOW (ref 3.5–5.1)
Sodium: 141 mmol/L (ref 135–145)

## 2020-08-31 LAB — CBC
HCT: 39 % (ref 36.0–46.0)
Hemoglobin: 12.5 g/dL (ref 12.0–15.0)
MCH: 28.9 pg (ref 26.0–34.0)
MCHC: 32.1 g/dL (ref 30.0–36.0)
MCV: 90.1 fL (ref 80.0–100.0)
Platelets: 169 10*3/uL (ref 150–400)
RBC: 4.33 MIL/uL (ref 3.87–5.11)
RDW: 12.6 % (ref 11.5–15.5)
WBC: 8.6 10*3/uL (ref 4.0–10.5)
nRBC: 0 % (ref 0.0–0.2)

## 2020-08-31 MED ORDER — TRAMADOL HCL 50 MG PO TABS
50.0000 mg | ORAL_TABLET | Freq: Three times a day (TID) | ORAL | Status: DC | PRN
  Administered 2020-09-02: 50 mg via ORAL
  Filled 2020-08-31: qty 1

## 2020-08-31 MED ORDER — TRAMADOL HCL 50 MG PO TABS: 50.0000 mg | ORAL_TABLET | Freq: Three times a day (TID) | ORAL | 0 refills | Status: DC | PRN

## 2020-08-31 MED ORDER — POTASSIUM CHLORIDE CRYS ER 20 MEQ PO TBCR
30.0000 meq | EXTENDED_RELEASE_TABLET | ORAL | Status: AC
  Administered 2020-08-31 (×2): 30 meq via ORAL
  Filled 2020-08-31 (×2): qty 1

## 2020-08-31 MED ORDER — ENOXAPARIN SODIUM 40 MG/0.4ML ~~LOC~~ SOLN: 40.0000 mg | SUBCUTANEOUS | 0 refills | Status: DC

## 2020-08-31 NOTE — Progress Notes (Addendum)
Subjective: 2 Days Post-Op s/p Procedure(s): CANNULATED HIP PINNING  Sleeping on entrance to room, pleasantly confused when woken. No complaints. Denies chest pain, shortness of breath, paraesthesias. No significant pain to right hip this morning.   Objective:  PE: VITALS:   Vitals:   08/30/20 0428 08/30/20 1306 08/30/20 2003 08/31/20 0418  BP: 111/82 110/61 (!) 128/58 (!) 141/58  Pulse: 82 66 72 66  Resp: 20 18 18 20   Temp: 97.8 F (36.6 C) 97.6 F (36.4 C) 97.7 F (36.5 C) (!) 97.5 F (36.4 C)  TempSrc: Axillary Axillary Oral Oral  SpO2: 100% 100% 96% 100%  Weight:      Height:       General: Alert, sitting up in bed GI: abdomen soft, non-tender MSK:  Neurovascular intact Sensation intact distally Intact pulses distally Dorsiflexion/Plantar flexion intact Incision: dressing C/D/I   LABS  Results for orders placed or performed during the hospital encounter of 08/27/20 (from the past 24 hour(s))  CBC     Status: None   Collection Time: 08/31/20  3:43 AM  Result Value Ref Range   WBC 8.6 4.0 - 10.5 K/uL   RBC 4.33 3.87 - 5.11 MIL/uL   Hemoglobin 12.5 12.0 - 15.0 g/dL   HCT 10/29/20 93.7 - 16.9 %   MCV 90.1 80.0 - 100.0 fL   MCH 28.9 26.0 - 34.0 pg   MCHC 32.1 30.0 - 36.0 g/dL   RDW 67.8 93.8 - 10.1 %   Platelets 169 150 - 400 K/uL   nRBC 0.0 0.0 - 0.2 %  Basic metabolic panel     Status: Abnormal   Collection Time: 08/31/20  3:43 AM  Result Value Ref Range   Sodium 141 135 - 145 mmol/L   Potassium 3.3 (L) 3.5 - 5.1 mmol/L   Chloride 103 98 - 111 mmol/L   CO2 26 22 - 32 mmol/L   Glucose, Bld 114 (H) 70 - 99 mg/dL   BUN 11 8 - 23 mg/dL   Creatinine, Ser 10/29/20 0.44 - 1.00 mg/dL   Calcium 8.5 (L) 8.9 - 10.3 mg/dL   GFR, Estimated 0.25 >85 mL/min   Anion gap 12 5 - 15    Pelvis Portable  Result Date: 08/29/2020 CLINICAL DATA:  Postop hip pinning today. EXAM: PORTABLE PELVIS 1-2 VIEWS COMPARISON:  Intraoperative fluoroscopic images earlier today. Right hip  radiographs 08/27/2020. FINDINGS: Three cannulated screws are again noted traversing the right femoral neck and terminating in the femoral head for treatment of a nondisplaced femoral neck fracture. Alignment appears anatomic. The femoral heads are approximated with the acetabula on this single projection. No new fracture is identified. IMPRESSION: Status post right hip pinning. Electronically Signed   By: 08/29/2020 M.D.   On: 08/29/2020 16:20   DG C-Arm 1-60 Min-No Report  Result Date: 08/29/2020 Fluoroscopy was utilized by the requesting physician.  No radiographic interpretation.   DG Hip Port Unilat With Pelvis 1V Right  Result Date: 08/29/2020 CLINICAL DATA:  80 year old female status post right hip pinning. EXAM: DG HIP (WITH OR WITHOUT PELVIS) 1V PORT RIGHT COMPARISON:  Earlier intraoperative fluoroscopic image dated 08/29/2020. FINDINGS: Single cross-table lateral view provided. Evaluation is very limited due to soft tissue attenuation. Three fixation pins noted in the right femoral neck. IMPRESSION: Status post pinning of the right femoral neck. Electronically Signed   By: 10/27/2020 M.D.   On: 08/29/2020 16:09   DG HIP OPERATIVE UNILAT W OR W/O PELVIS RIGHT  Result Date: 08/29/2020 CLINICAL DATA:  Elective surgery. EXAM: OPERATIVE RIGHT HIP (WITH PELVIS IF PERFORMED) 3 VIEWS TECHNIQUE: Fluoroscopic spot image(s) were submitted for interpretation post-operatively. COMPARISON:  Right hip radiographs 08/27/2020 FINDINGS: Three intraoperative spot fluoroscopic images demonstrate placement of 3 screws for fixation of the previously demonstrated impacted, nondisplaced femoral neck fracture. All 3 screws terminate within the margins of the femoral head. There is no dislocation. IMPRESSION: Intraoperative images during right hip pinning. Electronically Signed   By: Sebastian Ache M.D.   On: 08/29/2020 15:43    Assessment/Plan: Right femoral neck fracture 2 Days Post-Op s/p  Procedure(s): CANNULATED HIP PINNING Weightbearing: WBAT RLE Insicional and dressing care: Reinforce dressings as needed VTE prophylaxis: lovenox restarted, plan for lovenox for 30 days. Hbg remaining stable.   Pain control: tramadol, tylenol, limit tramadol use as much as possible Follow - up plan: 2 weeks with Dr. Dion Saucier Dispo: pending SNF placement  Contact information:   Weekdays 8-5 Janine Ores, PA-C [phone] A fter hours and holidays please check [email or web address] for group call information for [facility]  Armida Sans 08/31/2020, 7:06 AM

## 2020-08-31 NOTE — Progress Notes (Signed)
PROGRESS NOTE    Tiffany Mack  OIZ:[id number] DOB: [birth year] DOA: 08/27/2020 PCP: Mila Palmer, MD    Brief Narrative:  Tiffany Mack is a 80 year old female with past medical history significant for dementia, CAD, essential hypertension who presented from her memory care unit with hip pain and head bruise following mechanical fall.  Patient was in her normal state of health when she was trying to tie her shoes and fell forward out of her chair onto her head.  In the ED, CT notable for right frontal intraparenchymal and subarachnoid hemorrhage without mass-effect, x-ray right hip with femoral neck fracture.  Case was discussed with neurosurgery, Dr. Mickle Mallory guard and orthopedics Dr. Dion Saucier.  Hospital service consulted for further evaluation and management of acute right hip fracture and right frontal intraparenchymal/SAH.   Assessment & Plan:   Principal Problem:   Hip fracture (HCC) Active Problems:   HTN (hypertension)   Dementia (HCC)   CAD (coronary artery disease)   Malnutrition of moderate degree   Subarachnoid hemorrhage (HCC)   Right hip fracture Patient presenting from memory care unit following mechanical fall with right hip pain.  X-ray right hip notable for right hip fracture.  Patient underwent right hip pinning by orthopedics, Dr. Dion Saucier on 08/29/2020. --Weightbearing as tolerates --Lovenox for DVT prophylaxis x4 weeks per Ortho --Tylenol and tramadol prn for pain control --Continue PT/OT efforts while inpatient --Follow up 2 weeks with orthopedics, Dr. Dion Saucier --Pending SNF placement  Subarachnoid hemorrhage Intraparenchymal hemorrhage CT head notable for right frontal intraparenchymal and subarachnoid hemorrhage without mass-effect.  These are small and patient is asymptomatic.  Case was discussed with neurosurgery on-call, Dr. Maurice Small; and no further imaging required unless mental status changes.  Okay for Lovenox DVT prophylaxis postoperatively  after 24 hours since injury.  Recent COVID viral infection Patient asymptomatic, originally diagnosed on 08/11/2020.  No need for treatment, oxygenating well on room air.  No further isolation required.  Coronary artery disease Essential hypertension --Atorvastatin 80 mg p.o. daily --Metoprolol tartrate 12.5 mg p.o. twice daily  Dementia --Continue rivastigmine patch, venlafaxine 37.5 mg p.o. daily, Namenda --Delirium precautions --Get up during the day --Encourage a familiar face to remain present throughout the day --Keep blinds open and lights on during daylight hours --Minimize the use of opioids/benzodiazepines  GERD: Continue Protonix 40 mg p.o. daily  DVT prophylaxis: Lovenox   Code Status: DNR Family Communication: Updated patient's son who is present at bedside  Disposition Plan:  Level of care: Med-Surg Status is: Inpatient  Remains inpatient appropriate because:Unsafe d/c plan   Dispo: The patient is from: ALF              Anticipated d/c is to: SNF              Anticipated d/c date is: 1 day              Patient currently is medically stable to d/c.   Difficult to place patient No    Consultants:   Orthopedics, Dr. Dion Saucier  Neurosurgery, Dr. Maurice Small  Procedures:   Right hip pinning, Dr. Dion Saucier 08/29/2020  Antimicrobials:   Perioperative cefazolin   Subjective: Patient seen and examined bedside, resting comfortably.  No family present.  Pleasantly confused.  No specific complaints this morning.  Awaiting placement at SNF.  Denies headache, no chest pain, palpitations, no shortness of breath, no abdominal pain.  No acute events overnight per nursing staff.  Objective: Vitals:   08/30/20 3382 08/30/20 1306 08/30/20 2003 08/31/20 5053  BP: 111/82 110/61 (!) 128/58 (!) 141/58  Pulse: 82 66 72 66  Resp: 20 18 18 20   Temp: 97.8 F (36.6 C) 97.6 F (36.4 C) 97.7 F (36.5 C) (!) 97.5 F (36.4 C)  TempSrc: Axillary Axillary Oral Oral  SpO2: 100%  100% 96% 100%  Weight:      Height:        Intake/Output Summary (Last 24 hours) at 08/31/2020 1411 Last data filed at 08/31/2020 0520 Gross per 24 hour  Intake 240 ml  Output 1150 ml  Net -910 ml   Filed Weights   08/27/20 2015  Weight: 72.4 kg    Examination:  General exam: Appears calm and comfortable, pleasantly confused Respiratory system: Clear to auscultation. Respiratory effort normal.  Oxygen well on room air Cardiovascular system: S1 & S2 heard, RRR. No JVD, murmurs, rubs, gallops or clicks. No pedal edema. Gastrointestinal system: Abdomen is nondistended, soft and nontender. No organomegaly or masses felt. Normal bowel sounds heard. Central nervous system: Alert, not oriented to person/place/time/situation. No focal neurological deficits. Extremities: Symmetric 5 x 5 power. Skin: No rashes, lesions or ulcers, surgical site noted with dressings in place, C/D/I Psychiatry: Judgement and insight appear poor. Mood & affect appropriate.     Data Reviewed: I have personally reviewed following labs and imaging studies  CBC: Recent Labs  Lab 08/27/20 1503 08/28/20 0340 08/29/20 0332 08/30/20 0403 08/31/20 0343  WBC 13.7* 9.9 10.4 9.7 8.6  NEUTROABS 10.8*  --  7.8*  --   --   HGB 14.1 14.1 14.2 14.0 12.5  HCT 43.5 45.0 43.8 43.5 39.0  MCV 90.1 91.3 89.2 89.3 90.1  PLT 221 202 211 197 169   Basic Metabolic Panel: Recent Labs  Lab 08/27/20 1503 08/28/20 0340 08/29/20 0332 08/30/20 0403 08/31/20 0343  NA 144 140 140 140 141  K 3.6 3.8 3.4* 3.5 3.3*  CL 105 101 100 103 103  CO2 30 28 29 25 26   GLUCOSE 117* 105* 125* 142* 114*  BUN 14 11 8 10 11   CREATININE 0.69 0.56 0.61 0.65 0.67  CALCIUM 9.4 9.3 9.3 8.9 8.5*   GFR: Estimated Creatinine Clearance: 59.6 mL/min (by C-G formula based on SCr of 0.67 mg/dL). Liver Function Tests: No results for input(s): AST, ALT, ALKPHOS, BILITOT, PROT, ALBUMIN in the last 168 hours. No results for input(s): LIPASE, AMYLASE  in the last 168 hours. No results for input(s): AMMONIA in the last 168 hours. Coagulation Profile: Recent Labs  Lab 08/27/20 1503  INR 1.0   Cardiac Enzymes: No results for input(s): CKTOTAL, CKMB, CKMBINDEX, TROPONINI in the last 168 hours. BNP (last 3 results) No results for input(s): PROBNP in the last 8760 hours. HbA1C: No results for input(s): HGBA1C in the last 72 hours. CBG: No results for input(s): GLUCAP in the last 168 hours. Lipid Profile: No results for input(s): CHOL, HDL, LDLCALC, TRIG, CHOLHDL, LDLDIRECT in the last 72 hours. Thyroid Function Tests: No results for input(s): TSH, T4TOTAL, FREET4, T3FREE, THYROIDAB in the last 72 hours. Anemia Panel: No results for input(s): VITAMINB12, FOLATE, FERRITIN, TIBC, IRON, RETICCTPCT in the last 72 hours. Sepsis Labs: No results for input(s): PROCALCITON, LATICACIDVEN in the last 168 hours.  Recent Results (from the past 240 hour(s))  SARS Coronavirus 2 by RT PCR (hospital order, performed in [facility] hospital lab) Nasopharyngeal Nasopharyngeal Swab     Status: Abnormal   Collection Time: 08/27/20  4:08 PM   Specimen: Nasopharyngeal Swab  Result Value Ref Range  Status   SARS Coronavirus 2 POSITIVE (A) NEGATIVE Final    Comment: RESULT CALLED TO, READ BACK BY AND VERIFIED WITH: GARRISON, G RN @1800  ON 08/27/20 JACKSON,K (NOTE) SARS-CoV-2 target nucleic acids are DETECTED  SARS-CoV-2 RNA is generally detectable in upper respiratory specimens  during the acute phase of infection.  Positive results are indicative  of the presence of the identified virus, but do not rule out bacterial infection or co-infection with other pathogens not detected by the test.  Clinical correlation with patient history and  other diagnostic information is necessary to determine patient infection status.  The expected result is negative.  Fact Sheet for Patients:   08/29/20   Fact Sheet for Healthcare  Providers:   BoilerBrush.com.cy    This test is not yet approved or cleared by the https://pope.com/ FDA and  has been authorized for detection and/or diagnosis of SARS-CoV-2 by FDA under an Emergency Use Authorization (EUA).  This EUA will remain in effect (meaning  this test can be used) for the duration of  the COVID-19 declaration under Section 564(b)(1) of the Act, 21 U.S.C. section 360-bbb-3(b)(1), unless the authorization is terminated or revoked sooner.  Performed at [facility], [address] [place], [place]          Radiology Studies: Pelvis Portable  Result Date: 08/29/2020 CLINICAL DATA:  Postop hip pinning today. EXAM: PORTABLE PELVIS 1-2 VIEWS COMPARISON:  Intraoperative fluoroscopic images earlier today. Right hip radiographs 08/27/2020. FINDINGS: Three cannulated screws are again noted traversing the right femoral neck and terminating in the femoral head for treatment of a nondisplaced femoral neck fracture. Alignment appears anatomic. The femoral heads are approximated with the acetabula on this single projection. No new fracture is identified. IMPRESSION: Status post right hip pinning. Electronically Signed   By: 08/29/2020 M.D.   On: 08/29/2020 16:20   DG C-Arm 1-60 Min-No Report  Result Date: 08/29/2020 Fluoroscopy was utilized by the requesting physician.  No radiographic interpretation.   DG Hip Port Unilat With Pelvis 1V Right  Result Date: 08/29/2020 CLINICAL DATA:  80 year old female status post right hip pinning. EXAM: DG HIP (WITH OR WITHOUT PELVIS) 1V PORT RIGHT COMPARISON:  Earlier intraoperative fluoroscopic image dated 08/29/2020. FINDINGS: Single cross-table lateral view provided. Evaluation is very limited due to soft tissue attenuation. Three fixation pins noted in the right femoral neck. IMPRESSION: Status post pinning of the right femoral neck. Electronically Signed   By: 10/27/2020 M.D.   On:  08/29/2020 16:09   DG HIP OPERATIVE UNILAT W OR W/O PELVIS RIGHT  Result Date: 08/29/2020 CLINICAL DATA:  Elective surgery. EXAM: OPERATIVE RIGHT HIP (WITH PELVIS IF PERFORMED) 3 VIEWS TECHNIQUE: Fluoroscopic spot image(s) were submitted for interpretation post-operatively. COMPARISON:  Right hip radiographs 08/27/2020 FINDINGS: Three intraoperative spot fluoroscopic images demonstrate placement of 3 screws for fixation of the previously demonstrated impacted, nondisplaced femoral neck fracture. All 3 screws terminate within the margins of the femoral head. There is no dislocation. IMPRESSION: Intraoperative images during right hip pinning. Electronically Signed   By: 08/29/2020 M.D.   On: 08/29/2020 15:43        Scheduled Meds: . atorvastatin  80 mg Oral q1800  . docusate sodium  100 mg Oral BID  . enoxaparin (LOVENOX) injection  40 mg Subcutaneous Daily  . feeding supplement  237 mL Oral BID BM  . memantine  10 mg Oral QHS  . metoprolol tartrate  12.5 mg Oral BID  .  pantoprazole  40 mg Oral Q0600  . rivastigmine  13.3 mg Transdermal Q24H  . venlafaxine XR  37.5 mg Oral Q breakfast   Continuous Infusions:    LOS: 4 days    Time spent: 38 minutes spent on chart review, discussion with nursing staff, consultants, updating family and interview/physical exam; more than 50% of that time was spent in counseling and/or coordination of care.    Alvira Philips [place], DO [facility] Available via Epic secure chat 7am-7pm After these hours, please refer to coverage provider listed on [email or web address] 08/31/2020, 2:11 PM

## 2020-08-31 NOTE — Evaluation (Signed)
Occupational Therapy Evaluation Patient Details Name: Tiffany Mack MRN: [id number] DOB: [birth year] Today's Date: 08/31/2020    History of Present Illness 80 y.o. female with PMH of CAD, dementia, hospitalization for covid PNA and UTI with DC 07/26/19 admitted from memory care unit with fall, R femoral neck fx, s/p pinning 08/29/20.   Clinical Impression   Patient resides at memory care unit and per chart review was I with BADLs. Currently patient experiencing increased pain in R hip impacting standing tolerance, balance and functional transfers needing min A. Patient pushing away walker, therefore provided HHA and mod multimodal cues for hand placement as well as peri hygiene after voiding d/t patient perseverating on washing hands. Recommend continued acute OT services to maximize patient safety and independence with self care in order to facilitate D/C to venue listed below.    Follow Up Recommendations  SNF    Equipment Recommendations  None recommended by OT       Precautions / Restrictions Precautions Precautions: Fall Restrictions Weight Bearing Restrictions: Yes RLE Weight Bearing: Weight bearing as tolerated      Mobility Bed Mobility Overal bed mobility: Needs Assistance Bed Mobility: Supine to Sit     Supine to sit: HOB elevated;Min assist     General bed mobility comments: mod multimodal cues to bring LEs to EOB and use bed rail to sit up right. min A for trunk control    Transfers Overall transfer level: Needs assistance Equipment used: 1 person hand held assist Transfers: Sit to/from Stand;Stand Pivot Transfers Sit to Stand: Min assist;From elevated surface Stand pivot transfers: Min assist       General transfer comment: needed bed height elevated to stand after x1 attempt from regular height, pt pushed away RW, provided cues for hand placement and min A for standing balance    Balance Overall balance assessment: Needs assistance Sitting-balance  support: Feet supported Sitting balance-Leahy Scale: Good     Standing balance support: Single extremity supported Standing balance-Leahy Scale: Poor Standing balance comment: reliant on at least unilateral support                           ADL either performed or assessed with clinical judgement   ADL Overall ADL's : Needs assistance/impaired Eating/Feeding: Set up;Sitting Eating/Feeding Details (indicate cue type and reason): to open containers, patient able to open banana Grooming: Wash/dry hands;Wash/dry face;Set up;Sitting   Upper Body Bathing: Supervision/ safety;Set up;Sitting   Lower Body Bathing: Sit to/from stand;Minimal assistance   Upper Body Dressing : Supervision/safety;Set up;Sitting   Lower Body Dressing: Minimal assistance;Sitting/lateral leans;Sit to/from stand Lower Body Dressing Details (indicate cue type and reason): patient able to pull up socks in sitting, min A for standing balance Toilet Transfer: Minimal assistance;Cueing for safety;Cueing for sequencing;Stand-pivot;BSC Toilet Transfer Details (indicate cue type and reason): pt pushes away rolling walker, verbal cues to reach hand for BSC to stabilize, increased time with sitting due to pain in R hip Toileting- Clothing Manipulation and Hygiene: Minimal assistance;Sit to/from stand Toileting - Clothing Manipulation Details (indicate cue type and reason): for standing balance, mod cues to wash peri area after voiding as patient is perseverating on washing hands     Functional mobility during ADLs: Minimal assistance General ADL Comments: patient needing increased assistance with self care tasks due to pain impacting standing tolerance and balance                  Pertinent Vitals/Pain Pain Assessment:  Faces Faces Pain Scale: Hurts even more Pain Location: R hip with movement Pain Descriptors / Indicators: Grimacing;Guarding Pain Intervention(s): Patient requesting pain meds-RN notified      Hand Dominance Right   Extremity/Trunk Assessment Upper Extremity Assessment Upper Extremity Assessment: Generalized weakness   Lower Extremity Assessment Lower Extremity Assessment: Defer to PT evaluation   Cervical / Trunk Assessment Cervical / Trunk Assessment: Normal   Communication Communication Communication: No difficulties   Cognition Arousal/Alertness: Awake/alert Behavior During Therapy: WFL for tasks assessed/performed Overall Cognitive Status: History of cognitive impairments - at baseline                                 General Comments: pleasantly confused              Home Living Family/patient expects to be discharged to:: Skilled nursing facility                                        Prior Functioning/Environment Level of Independence: Independent        Comments: independent with mobility, had assistance for bathing/dressing        OT Problem List: Decreased activity tolerance;Impaired balance (sitting and/or standing);Decreased safety awareness;Pain      OT Treatment/Interventions: Self-care/ADL training;Balance training;Patient/family education;Therapeutic activities;DME and/or AE instruction    OT Goals(Current goals can be found in the care plan section) Acute Rehab OT Goals Patient Stated Goal: eat breakfast OT Goal Formulation: With patient Time For Goal Achievement: 09/14/20 Potential to Achieve Goals: Good  OT Frequency: Min 2X/week    AM-PAC OT "6 Clicks" Daily Activity     Outcome Measure Help from another person eating meals?: A Little Help from another person taking care of personal grooming?: A Little Help from another person toileting, which includes using toliet, bedpan, or urinal?: A Little Help from another person bathing (including washing, rinsing, drying)?: A Little Help from another person to put on and taking off regular upper body clothing?: A Little Help from another person to  put on and taking off regular lower body clothing?: A Little 6 Click Score: 18   End of Session Nurse Communication: Mobility status;Patient requests pain meds  Activity Tolerance: Patient tolerated treatment well Patient left: in chair;with call bell/phone within reach;with chair alarm set  OT Visit Diagnosis: Unsteadiness on feet (R26.81);History of falling (Z91.81);Pain Pain - Right/Left: Right Pain - part of body: Hip                Time: 4627-0350 OT Time Calculation (min): 26 min Charges:  OT General Charges $OT Visit: 1 Visit OT Evaluation $OT Eval Low Complexity: 1 Low OT Treatments $Self Care/Home Management : 8-22 mins  Marlyce Huge OT OT pager: [phone]  Carmelia Roller 08/31/2020, 11:50 AM

## 2020-08-31 NOTE — Progress Notes (Signed)
Physical Therapy Treatment Patient Details Name: Tiffany Mack MRN: [id number] DOB: 04-Sep-1940 Today's Date: 08/31/2020    History of Present Illness 80 y.o. female with PMH of CAD, dementia, hospitalization for covid PNA and UTI with DC 07/26/19 admitted from memory care unit with fall, R femoral neck fx, s/p pinning 08/29/20.    PT Comments    Pt tolerated increased ambulation distance with decreased assistance today. She ambulated 30' with hand held assist of 1 (pt not receptive to use of RW). Performed RLE strengthening/ROM exercises with min assist. Pt is pleasantly confused and participates well with PT.    Follow Up Recommendations  SNF;Supervision/Assistance - 24 hour;Supervision for mobility/OOB     Equipment Recommendations  Rolling walker with 5" wheels    Recommendations for Other Services       Precautions / Restrictions Precautions Precautions: Fall Restrictions Weight Bearing Restrictions: No RLE Weight Bearing: Weight bearing as tolerated    Mobility  Bed Mobility Overal bed mobility: Needs Assistance Bed Mobility: Supine to Sit     Supine to sit: HOB elevated;Min assist     General bed mobility comments: up in recliner  Transfers Overall transfer level: Needs assistance Equipment used: 1 person hand held assist Transfers: Sit to/from Stand Sit to Stand: Min assist Stand pivot transfers: Min assist       General transfer comment: min A to rise from recliner, VCs hand placment  Ambulation/Gait Ambulation/Gait assistance: Min assist Gait Distance (Feet): 26 Feet Assistive device: 1 person hand held assist Gait Pattern/deviations: Step-through pattern;Decreased stride length;Narrow base of support;Decreased weight shift to right Gait velocity: decr   General Gait Details: min HHA of 1 (on pt's L side) for balance and to offweight RLE with stance phase on R, mild grimmacing and decr weight shift to R 2* pain, no loss of balance   Stairs              Wheelchair Mobility    Modified Rankin (Stroke Patients Only)       Balance Overall balance assessment: Needs assistance Sitting-balance support: Feet supported Sitting balance-Leahy Scale: Good     Standing balance support: Single extremity supported Standing balance-Leahy Scale: Poor Standing balance comment: reliant on at least unilateral support                            Cognition Arousal/Alertness: Awake/alert Behavior During Therapy: WFL for tasks assessed/performed Overall Cognitive Status: History of cognitive impairments - at baseline                                 General Comments: pleasantly confused      Exercises General Exercises - Lower Extremity Ankle Circles/Pumps: AROM;Both;10 reps;Supine Short Arc Quad: AAROM;Right;10 reps;Supine Long Arc Quad: AROM;Right;10 reps;Seated Heel Slides: AAROM;Right;Supine;10 reps Hip ABduction/ADduction: AAROM;Right;Supine;10 reps    General Comments        Pertinent Vitals/Pain Pain Assessment: Faces Faces Pain Scale: Hurts little more Pain Location: R hip with movement Pain Descriptors / Indicators: Grimacing;Guarding Pain Intervention(s): Limited activity within patient's tolerance;Monitored during session;Repositioned    Home Living Family/patient expects to be discharged to:: Skilled nursing facility                    Prior Function Level of Independence: Independent      Comments: independent with mobility, had assistance for bathing/dressing   PT Goals (current goals  can now be found in the care plan section) Acute Rehab PT Goals Patient Stated Goal: none stated PT Goal Formulation: Patient unable to participate in goal setting Time For Goal Achievement: 09/13/20 Potential to Achieve Goals: Fair Progress towards PT goals: Progressing toward goals    Frequency    Min 3X/week      PT Plan Current plan remains appropriate    Co-evaluation               AM-PAC PT "6 Clicks" Mobility   Outcome Measure  Help needed turning from your back to your side while in a flat bed without using bedrails?: A Lot Help needed moving from lying on your back to sitting on the side of a flat bed without using bedrails?: A Lot Help needed moving to and from a bed to a chair (including a wheelchair)?: A Little Help needed standing up from a chair using your arms (e.g., wheelchair or bedside chair)?: A Little Help needed to walk in hospital room?: A Little Help needed climbing 3-5 steps with a railing? : A Lot 6 Click Score: 15    End of Session Equipment Utilized During Treatment: Gait belt Activity Tolerance: Patient tolerated treatment well Patient left: in chair;with call bell/phone within reach;with chair alarm set Nurse Communication: Mobility status PT Visit Diagnosis: Difficulty in walking, not elsewhere classified (R26.2)     Time: 2841-3244 PT Time Calculation (min) (ACUTE ONLY): 13 min  Charges:  $Gait Training: 8-22 mins                    Ralene Bathe Kistler PT 08/31/2020  Acute Rehabilitation Services Pager [phone] Office [phone]

## 2020-09-01 DIAGNOSIS — S72001A Fracture of unspecified part of neck of right femur, initial encounter for closed fracture: Secondary | ICD-10-CM | POA: Diagnosis not present

## 2020-09-01 DIAGNOSIS — G3183 Dementia with Lewy bodies: Secondary | ICD-10-CM | POA: Diagnosis not present

## 2020-09-01 DIAGNOSIS — I609 Nontraumatic subarachnoid hemorrhage, unspecified: Secondary | ICD-10-CM | POA: Diagnosis not present

## 2020-09-01 DIAGNOSIS — I2510 Atherosclerotic heart disease of native coronary artery without angina pectoris: Secondary | ICD-10-CM | POA: Diagnosis not present

## 2020-09-01 LAB — URINALYSIS, ROUTINE W REFLEX MICROSCOPIC
Bilirubin Urine: NEGATIVE
Glucose, UA: NEGATIVE mg/dL
Ketones, ur: NEGATIVE mg/dL
Nitrite: NEGATIVE
Protein, ur: NEGATIVE mg/dL
Specific Gravity, Urine: 1.013 (ref 1.005–1.030)
WBC, UA: >50 WBC/hpf — ABNORMAL HIGH (ref 0–5)
pH: 6 (ref 5.0–8.0)

## 2020-09-01 LAB — CBC
HCT: 36.2 % (ref 36.0–46.0)
Hemoglobin: 11.6 g/dL — ABNORMAL LOW (ref 12.0–15.0)
MCH: 28.7 pg (ref 26.0–34.0)
MCHC: 32 g/dL (ref 30.0–36.0)
MCV: 89.6 fL (ref 80.0–100.0)
Platelets: 180 10*3/uL (ref 150–400)
RBC: 4.04 MIL/uL (ref 3.87–5.11)
RDW: 12.8 % (ref 11.5–15.5)
WBC: 7.9 10*3/uL (ref 4.0–10.5)
nRBC: 0 % (ref 0.0–0.2)

## 2020-09-01 MED ORDER — TRAMADOL HCL 50 MG PO TABS: 50.0000 mg | ORAL_TABLET | Freq: Two times a day (BID) | ORAL | 0 refills | Status: AC | PRN

## 2020-09-01 MED ORDER — SODIUM CHLORIDE 0.9 % IV SOLN
1.0000 g | INTRAVENOUS | Status: DC
  Administered 2020-09-01 – 2020-09-02 (×2): 1 g via INTRAVENOUS
  Filled 2020-09-01: qty 10
  Filled 2020-09-01: qty 1

## 2020-09-01 MED ORDER — ACETAMINOPHEN 325 MG PO TABS: 325.0000 mg | ORAL_TABLET | Freq: Four times a day (QID) | ORAL | 1 refills | Status: DC | PRN

## 2020-09-01 NOTE — Plan of Care (Signed)
While cleaning the patient up this morning, I noticed a very strong odor from her urine. Urine is cloudy and dark amber. Pt has a temp of 100.2. Paged to see if MD wants to do a urinalysis and culture for possible UTI? Waiting for new orders.

## 2020-09-01 NOTE — Progress Notes (Signed)
     Subjective: 3 Days Post-Op s/p Procedure(s): CANNULATED HIP PINNING  Patient is alert, pleasantly confused. She complains of mild hip pain this morning.   Objective:  PE: VITALS:   Vitals:   08/31/20 0418 08/31/20 1505 08/31/20 2033 09/01/20 0611  BP: (!) 141/58 (!) 137/57 (!) 125/51 (!) 122/58  Pulse: 66 70 77 76  Resp: 20 20    Temp: (!) 97.5 F (36.4 C) 97.8 F (36.6 C) 98.9 F (37.2 C) 100.2 F (37.9 C)  TempSrc: Oral Axillary Oral Oral  SpO2: 100% 98% 95% 97%  Weight:      Height:       General: Alert, sitting up in bed GI: abdomen soft, non-tender MSK: Neurovascular intact Sensation intact distally Intact pulses distally Dorsiflexion/Plantar flexion intact Incision:dressing C/D/I, changed today  LABS  Results for orders placed or performed during the hospital encounter of 08/27/20 (from the past 24 hour(s))  CBC     Status: Abnormal   Collection Time: 09/01/20  3:44 AM  Result Value Ref Range   WBC 7.9 4.0 - 10.5 K/uL   RBC 4.04 3.87 - 5.11 MIL/uL   Hemoglobin 11.6 (L) 12.0 - 15.0 g/dL   HCT 03.0 09.2 - 33.0 %   MCV 89.6 80.0 - 100.0 fL   MCH 28.7 26.0 - 34.0 pg   MCHC 32.0 30.0 - 36.0 g/dL   RDW 07.6 22.6 - 33.3 %   Platelets 180 150 - 400 K/uL   nRBC 0.0 0.0 - 0.2 %    No results found.  Assessment/Plan: Right femoral neck fracture 3 Days Post-Op s/p Procedure(s): CANNULATED HIP PINNING Weightbearing:WBATRLE Insicional and dressing care:Reinforce dressings as needed, dressing changed this am. VTE prophylaxis:lovenox restarted, plan for lovenox for 30 days. Hbg remaining stable.  Pain control:tramadol, tylenol, limit tramadol use as much as possible.medication Rx's placed in chart. Follow - up plan:2 weekswith Dr. Dion Saucier Dispo: pending SNF placement  Contact information:   Weekdays 8-5 Janine Ores, PA-C [phone] A fter hours and holidays please check [email or web address] for group call information for [facility]  Armida Sans 09/01/2020, 8:47 AM

## 2020-09-01 NOTE — Progress Notes (Signed)
PROGRESS NOTE    Tiffany Mack  WJX:[id number] DOB: 13-Oct-1940 DOA: 08/27/2020 PCP: Mila Palmer, MD    Brief Narrative:  Tiffany Mack is a 80 year old female with past medical history significant for dementia, CAD, essential hypertension who presented from her memory care unit with hip pain and head bruise following mechanical fall.  Patient was in her normal state of health when she was trying to tie her shoes and fell forward out of her chair onto her head.  In the ED, CT notable for right frontal intraparenchymal and subarachnoid hemorrhage without mass-effect, x-ray right hip with femoral neck fracture.  Case was discussed with neurosurgery, Dr. Mickle Mallory guard and orthopedics Dr. Dion Saucier.  Hospital service consulted for further evaluation and management of acute right hip fracture and right frontal intraparenchymal/SAH.   Assessment & Plan:   Principal Problem:   Hip fracture (HCC) Active Problems:   HTN (hypertension)   Dementia (HCC)   CAD (coronary artery disease)   Malnutrition of moderate degree   Subarachnoid hemorrhage (HCC)   Right hip fracture Patient presenting from memory care unit following mechanical fall with right hip pain.  X-ray right hip notable for right hip fracture.  Patient underwent right hip pinning by orthopedics, Dr. Dion Saucier on 08/29/2020. --Weightbearing as tolerates --Lovenox for DVT prophylaxis x4 weeks per Ortho --Tylenol and tramadol prn for pain control --Continue PT/OT efforts while inpatient --Follow up 2 weeks with orthopedics, Dr. Dion Saucier --Pending SNF placement  Subarachnoid hemorrhage Intraparenchymal hemorrhage CT head notable for right frontal intraparenchymal and subarachnoid hemorrhage without mass-effect.  These are small and patient is asymptomatic.  Case was discussed with neurosurgery on-call, Dr. Maurice Small; and no further imaging required unless mental status changes.  Okay for Lovenox DVT prophylaxis postoperatively  after 24 hours since injury.  Low-grade fever Patient with temperature 100.2 this morning, nursing reports malodorous urine; concerning for UTI.  WBC 7.9, within normal limits. --Obtain urine analysis with culture --Monitor fever curve --Await antibiotic therapy until obtain UA  Recent COVID viral infection Patient asymptomatic, originally diagnosed on 08/11/2020.  No need for treatment, oxygenating well on room air.  No further isolation required.  Coronary artery disease Essential hypertension --Atorvastatin 80 mg p.o. daily --Metoprolol tartrate 12.5 mg p.o. twice daily  Dementia --Continue rivastigmine patch, venlafaxine 37.5 mg p.o. daily, Namenda --Delirium precautions --Get up during the day --Encourage a familiar face to remain present throughout the day --Keep blinds open and lights on during daylight hours --Minimize the use of opioids/benzodiazepines  GERD: Continue Protonix 40 mg p.o. daily  DVT prophylaxis: Lovenox   Code Status: DNR Family Communication: No family present at bedside this morning  Disposition Plan:  Level of care: Med-Surg Status is: Inpatient  Remains inpatient appropriate because:Unsafe d/c plan   Dispo: The patient is from: ALF              Anticipated d/c is to: [facility]              Anticipated d/c date is: 1 day              Patient currently is medically stable to d/c.   Difficult to place patient No    Consultants:   Orthopedics, Dr. Dion Saucier  Neurosurgery, Dr. Maurice Small  Procedures:   Right hip pinning, Dr. Dion Saucier 08/29/2020  Antimicrobials:   Perioperative cefazolin   Subjective: Patient seen and examined bedside, resting comfortably.  No family present.  Pleasantly confused.  No specific complaints this morning.  Awaiting placement  at SNF.  Denies headache, no chest pain, palpitations, no shortness of breath, no abdominal pain.  No acute events overnight per nursing staff.  Objective: Vitals:   08/31/20 0418  08/31/20 1505 08/31/20 2033 09/01/20 0611  BP: (!) 141/58 (!) 137/57 (!) 125/51 (!) 122/58  Pulse: 66 70 77 76  Resp: 20 20    Temp: (!) 97.5 F (36.4 C) 97.8 F (36.6 C) 98.9 F (37.2 C) 100.2 F (37.9 C)  TempSrc: Oral Axillary Oral Oral  SpO2: 100% 98% 95% 97%  Weight:      Height:        Intake/Output Summary (Last 24 hours) at 09/01/2020 1155 Last data filed at 09/01/2020 0600 Gross per 24 hour  Intake 480 ml  Output 725 ml  Net -245 ml   Filed Weights   08/27/20 2015  Weight: 72.4 kg    Examination:  General exam: Appears calm and comfortable, pleasantly confused Respiratory system: Clear to auscultation. Respiratory effort normal.  Oxygen well on room air Cardiovascular system: S1 & S2 heard, RRR. No JVD, murmurs, rubs, gallops or clicks. No pedal edema. Gastrointestinal system: Abdomen is nondistended, soft and nontender. No organomegaly or masses felt. Normal bowel sounds heard. Central nervous system: Alert, not oriented to person/place/time/situation. No focal neurological deficits. Extremities: Symmetric 5 x 5 power. Skin: No rashes, lesions or ulcers, surgical site noted with dressings in place, C/D/I Psychiatry: Judgement and insight appear poor. Mood & affect appropriate.     Data Reviewed: I have personally reviewed following labs and imaging studies  CBC: Recent Labs  Lab 08/27/20 1503 08/28/20 0340 08/29/20 0332 08/30/20 0403 08/31/20 0343 09/01/20 0344  WBC 13.7* 9.9 10.4 9.7 8.6 7.9  NEUTROABS 10.8*  --  7.8*  --   --   --   HGB 14.1 14.1 14.2 14.0 12.5 11.6*  HCT 43.5 45.0 43.8 43.5 39.0 36.2  MCV 90.1 91.3 89.2 89.3 90.1 89.6  PLT 221 202 211 197 169 180   Basic Metabolic Panel: Recent Labs  Lab 08/27/20 1503 08/28/20 0340 08/29/20 0332 08/30/20 0403 08/31/20 0343  NA 144 140 140 140 141  K 3.6 3.8 3.4* 3.5 3.3*  CL 105 101 100 103 103  CO2 30 28 29 25 26   GLUCOSE 117* 105* 125* 142* 114*  BUN 14 11 8 10 11   CREATININE 0.69  0.56 0.61 0.65 0.67  CALCIUM 9.4 9.3 9.3 8.9 8.5*   GFR: Estimated Creatinine Clearance: 59.6 mL/min (by C-G formula based on SCr of 0.67 mg/dL). Liver Function Tests: No results for input(s): AST, ALT, ALKPHOS, BILITOT, PROT, ALBUMIN in the last 168 hours. No results for input(s): LIPASE, AMYLASE in the last 168 hours. No results for input(s): AMMONIA in the last 168 hours. Coagulation Profile: Recent Labs  Lab 08/27/20 1503  INR 1.0   Cardiac Enzymes: No results for input(s): CKTOTAL, CKMB, CKMBINDEX, TROPONINI in the last 168 hours. BNP (last 3 results) No results for input(s): PROBNP in the last 8760 hours. HbA1C: No results for input(s): HGBA1C in the last 72 hours. CBG: No results for input(s): GLUCAP in the last 168 hours. Lipid Profile: No results for input(s): CHOL, HDL, LDLCALC, TRIG, CHOLHDL, LDLDIRECT in the last 72 hours. Thyroid Function Tests: No results for input(s): TSH, T4TOTAL, FREET4, T3FREE, THYROIDAB in the last 72 hours. Anemia Panel: No results for input(s): VITAMINB12, FOLATE, FERRITIN, TIBC, IRON, RETICCTPCT in the last 72 hours. Sepsis Labs: No results for input(s): PROCALCITON, LATICACIDVEN in the last 168 hours.  Recent Results (from the past 240 hour(s))  SARS Coronavirus 2 by RT PCR (hospital order, performed in [facility] hospital lab) Nasopharyngeal Nasopharyngeal Swab     Status: Abnormal   Collection Time: 08/27/20  4:08 PM   Specimen: Nasopharyngeal Swab  Result Value Ref Range Status   SARS Coronavirus 2 POSITIVE (A) NEGATIVE Final    Comment: RESULT CALLED TO, READ BACK BY AND VERIFIED WITH: Harle Stanford RN @1800  ON 08/27/20 JACKSON,K (NOTE) SARS-CoV-2 target nucleic acids are DETECTED  SARS-CoV-2 RNA is generally detectable in upper respiratory specimens  during the acute phase of infection.  Positive results are indicative  of the presence of the identified virus, but do not rule out bacterial infection or co-infection with other  pathogens not detected by the test.  Clinical correlation with patient history and  other diagnostic information is necessary to determine patient infection status.  The expected result is negative.  Fact Sheet for Patients:   08/29/20   Fact Sheet for Healthcare Providers:   BoilerBrush.com.cy    This test is not yet approved or cleared by the https://pope.com/ FDA and  has been authorized for detection and/or diagnosis of SARS-CoV-2 by FDA under an Emergency Use Authorization (EUA).  This EUA will remain in effect (meaning  this test can be used) for the duration of  the COVID-19 declaration under Section 564(b)(1) of the Act, 21 U.S.C. section 360-bbb-3(b)(1), unless the authorization is terminated or revoked sooner.  Performed at [facility], [address], [place], [place]          Radiology Studies: No results found.      Scheduled Meds: . atorvastatin  80 mg Oral q1800  . docusate sodium  100 mg Oral BID  . enoxaparin (LOVENOX) injection  40 mg Subcutaneous Daily  . feeding supplement  237 mL Oral BID BM  . memantine  10 mg Oral QHS  . metoprolol tartrate  12.5 mg Oral BID  . pantoprazole  40 mg Oral Q0600  . rivastigmine  13.3 mg Transdermal Q24H  . venlafaxine XR  37.5 mg Oral Q breakfast   Continuous Infusions:    LOS: 5 days    Time spent: 38 minutes spent on chart review, discussion with nursing staff, consultants, updating family and interview/physical exam; more than 50% of that time was spent in counseling and/or coordination of care.    [id number] Alvira Philips, DO [facility] Available via Epic secure chat 7am-7pm After these hours, please refer to coverage provider listed on [email or web address] 09/01/2020, 11:55 AM

## 2020-09-02 DIAGNOSIS — S72001A Fracture of unspecified part of neck of right femur, initial encounter for closed fracture: Secondary | ICD-10-CM | POA: Diagnosis not present

## 2020-09-02 DIAGNOSIS — G3183 Dementia with Lewy bodies: Secondary | ICD-10-CM | POA: Diagnosis not present

## 2020-09-02 DIAGNOSIS — I609 Nontraumatic subarachnoid hemorrhage, unspecified: Secondary | ICD-10-CM | POA: Diagnosis not present

## 2020-09-02 DIAGNOSIS — I2510 Atherosclerotic heart disease of native coronary artery without angina pectoris: Secondary | ICD-10-CM | POA: Diagnosis not present

## 2020-09-02 LAB — CBC
HCT: 36.6 % (ref 36.0–46.0)
Hemoglobin: 12 g/dL (ref 12.0–15.0)
MCH: 29.5 pg (ref 26.0–34.0)
MCHC: 32.8 g/dL (ref 30.0–36.0)
MCV: 89.9 fL (ref 80.0–100.0)
Platelets: 193 K/uL (ref 150–400)
RBC: 4.07 MIL/uL (ref 3.87–5.11)
RDW: 12.6 % (ref 11.5–15.5)
WBC: 7.3 K/uL (ref 4.0–10.5)
nRBC: 0 % (ref 0.0–0.2)

## 2020-09-02 LAB — BASIC METABOLIC PANEL
Anion gap: 7 (ref 5–15)
BUN: 14 mg/dL (ref 8–23)
CO2: 25 mmol/L (ref 22–32)
Calcium: 8.8 mg/dL — ABNORMAL LOW (ref 8.9–10.3)
Chloride: 104 mmol/L (ref 98–111)
Creatinine, Ser: 0.54 mg/dL (ref 0.44–1.00)
GFR, Estimated: >60 mL/min (ref >60)
Glucose, Bld: 120 mg/dL — ABNORMAL HIGH (ref 70–99)
Potassium: 3.9 mmol/L (ref 3.5–5.1)
Sodium: 136 mmol/L (ref 135–145)

## 2020-09-02 NOTE — Progress Notes (Signed)
PROGRESS NOTE    Tiffany Mack  YIR:[id number] DOB: 1940-09-16 DOA: 08/27/2020 PCP: Mila Palmer, MD    Brief Narrative:  Tiffany Mack is a 80 year old female with past medical history significant for dementia, CAD, essential hypertension who presented from her memory care unit with hip pain and head bruise following mechanical fall.  Patient was in her normal state of health when she was trying to tie her shoes and fell forward out of her chair onto her head.  In the ED, CT notable for right frontal intraparenchymal and subarachnoid hemorrhage without mass-effect, x-ray right hip with femoral neck fracture.  Case was discussed with neurosurgery, Dr. Mickle Mallory guard and orthopedics Dr. Dion Saucier.  Hospital service consulted for further evaluation and management of acute right hip fracture and right frontal intraparenchymal/SAH.   Assessment & Plan:   Principal Problem:   Hip fracture (HCC) Active Problems:   HTN (hypertension)   Dementia (HCC)   CAD (coronary artery disease)   Malnutrition of moderate degree   Subarachnoid hemorrhage (HCC)   Right hip fracture Patient presenting from memory care unit following mechanical fall with right hip pain.  X-ray right hip notable for right hip fracture.  Patient underwent right hip pinning by orthopedics, Dr. Dion Saucier on 08/29/2020. --Weightbearing as tolerates --Lovenox for DVT prophylaxis x4 weeks per Ortho --Tylenol and tramadol prn for pain control --Continue PT/OT efforts while inpatient --Follow up 2 weeks with orthopedics, Dr. Dion Saucier --Pending SNF placement  Subarachnoid hemorrhage Intraparenchymal hemorrhage CT head notable for right frontal intraparenchymal and subarachnoid hemorrhage without mass-effect.  These are small and patient is asymptomatic.  Case was discussed with neurosurgery on-call, Dr. Maurice Small; and no further imaging required unless mental status changes.  Okay for Lovenox DVT prophylaxis postoperatively  after 24 hours since injury.  Urinary tract infection UA with large leukocytes, negative nitrite, many bacteria, greater than 50 WBCs. --Urine culture: Pending --Ceftriaxone 1 g IV every 24 hours  Recent COVID viral infection Patient asymptomatic, originally diagnosed on 08/11/2020.  No need for treatment, oxygenating well on room air.  No further isolation required.  Coronary artery disease Essential hypertension --Atorvastatin 80 mg p.o. daily --Metoprolol tartrate 12.5 mg p.o. twice daily  Dementia --Continue rivastigmine patch, venlafaxine 37.5 mg p.o. daily, Namenda --Delirium precautions --Get up during the day --Encourage a familiar face to remain present throughout the day --Keep blinds open and lights on during daylight hours --Minimize the use of opioids/benzodiazepines  GERD: Continue Protonix 40 mg p.o. daily  DVT prophylaxis: Lovenox   Code Status: DNR Family Communication: No family present at bedside this morning  Disposition Plan:  Level of care: Med-Surg Status is: Inpatient  Remains inpatient appropriate because:Unsafe d/c plan   Dispo: The patient is from: ALF              Anticipated d/c is to: [facility]              Anticipated d/c date is: 1 day              Patient currently is medically stable to d/c.   Difficult to place patient No    Consultants:   Orthopedics, Dr. Dion Saucier  Neurosurgery, Dr. Maurice Small  Procedures:   Right hip pinning, Dr. Dion Saucier 08/29/2020  Antimicrobials:   Perioperative cefazolin  Ceftriaxone 2/4>>   Subjective: Patient seen and examined bedside, resting comfortably.  No family present.  Pleasantly confused.  No specific complaints this morning.  Awaiting placement at SNF.  Denies headache, no chest  pain, palpitations, no shortness of breath, no abdominal pain.  No acute events overnight per nursing staff.  Objective: Vitals:   08/31/20 2033 09/01/20 0611 09/01/20 2123 09/02/20 0445  BP: (!) 125/51 (!)  122/58 (!) 107/55 (!) 145/54  Pulse: 77 76 74 73  Resp:   16 16  Temp: 98.9 F (37.2 C) 100.2 F (37.9 C) 99 F (37.2 C) 98.2 F (36.8 C)  TempSrc: Oral Oral Oral Oral  SpO2: 95% 97% 96% 96%  Weight:      Height:        Intake/Output Summary (Last 24 hours) at 09/02/2020 1052 Last data filed at 09/02/2020 0500 Gross per 24 hour  Intake 591 ml  Output 900 ml  Net -309 ml   Filed Weights   08/27/20 2015  Weight: 72.4 kg    Examination:  General exam: Appears calm and comfortable, pleasantly confused Respiratory system: Clear to auscultation. Respiratory effort normal.  Oxygen well on room air Cardiovascular system: S1 & S2 heard, RRR. No JVD, murmurs, rubs, gallops or clicks. No pedal edema. Gastrointestinal system: Abdomen is nondistended, soft and nontender. No organomegaly or masses felt. Normal bowel sounds heard. Central nervous system: Alert, not oriented to person/place/time/situation. No focal neurological deficits. Extremities: Symmetric 5 x 5 power. Skin: No rashes, lesions or ulcers, surgical site noted with dressings in place, C/D/I Psychiatry: Judgement and insight appear poor. Mood & affect appropriate.     Data Reviewed: I have personally reviewed following labs and imaging studies  CBC: Recent Labs  Lab 08/27/20 1503 08/28/20 0340 08/29/20 0332 08/30/20 0403 08/31/20 0343 09/01/20 0344 09/02/20 0416  WBC 13.7*   < > 10.4 9.7 8.6 7.9 7.3  NEUTROABS 10.8*  --  7.8*  --   --   --   --   HGB 14.1   < > 14.2 14.0 12.5 11.6* 12.0  HCT 43.5   < > 43.8 43.5 39.0 36.2 36.6  MCV 90.1   < > 89.2 89.3 90.1 89.6 89.9  PLT 221   < > 211 197 169 180 193   < > = values in this interval not displayed.   Basic Metabolic Panel: Recent Labs  Lab 08/28/20 0340 08/29/20 0332 08/30/20 0403 08/31/20 0343 09/02/20 0416  NA 140 140 140 141 136  K 3.8 3.4* 3.5 3.3* 3.9  CL 101 100 103 103 104  CO2 28 29 25 26 25   GLUCOSE 105* 125* 142* 114* 120*  BUN 11 8 10 11  14   CREATININE 0.56 0.61 0.65 0.67 0.54  CALCIUM 9.3 9.3 8.9 8.5* 8.8*   GFR: Estimated Creatinine Clearance: 59.6 mL/min (by C-G formula based on SCr of 0.54 mg/dL). Liver Function Tests: No results for input(s): AST, ALT, ALKPHOS, BILITOT, PROT, ALBUMIN in the last 168 hours. No results for input(s): LIPASE, AMYLASE in the last 168 hours. No results for input(s): AMMONIA in the last 168 hours. Coagulation Profile: Recent Labs  Lab 08/27/20 1503  INR 1.0   Cardiac Enzymes: No results for input(s): CKTOTAL, CKMB, CKMBINDEX, TROPONINI in the last 168 hours. BNP (last 3 results) No results for input(s): PROBNP in the last 8760 hours. HbA1C: No results for input(s): HGBA1C in the last 72 hours. CBG: No results for input(s): GLUCAP in the last 168 hours. Lipid Profile: No results for input(s): CHOL, HDL, LDLCALC, TRIG, CHOLHDL, LDLDIRECT in the last 72 hours. Thyroid Function Tests: No results for input(s): TSH, T4TOTAL, FREET4, T3FREE, THYROIDAB in the last 72 hours. Anemia Panel:  No results for input(s): VITAMINB12, FOLATE, FERRITIN, TIBC, IRON, RETICCTPCT in the last 72 hours. Sepsis Labs: No results for input(s): PROCALCITON, LATICACIDVEN in the last 168 hours.  Recent Results (from the past 240 hour(s))  SARS Coronavirus 2 by RT PCR (hospital order, performed in [facility] hospital lab) Nasopharyngeal Nasopharyngeal Swab     Status: Abnormal   Collection Time: 08/27/20  4:08 PM   Specimen: Nasopharyngeal Swab  Result Value Ref Range Status   SARS Coronavirus 2 POSITIVE (A) NEGATIVE Final    Comment: RESULT CALLED TO, READ BACK BY AND VERIFIED WITH: Harle Stanford RN @1800  ON 08/27/20 JACKSON,K (NOTE) SARS-CoV-2 target nucleic acids are DETECTED  SARS-CoV-2 RNA is generally detectable in upper respiratory specimens  during the acute phase of infection.  Positive results are indicative  of the presence of the identified virus, but do not rule out bacterial infection or  co-infection with other pathogens not detected by the test.  Clinical correlation with patient history and  other diagnostic information is necessary to determine patient infection status.  The expected result is negative.  Fact Sheet for Patients:   08/29/20   Fact Sheet for Healthcare Providers:   BoilerBrush.com.cy    This test is not yet approved or cleared by the https://pope.com/ FDA and  has been authorized for detection and/or diagnosis of SARS-CoV-2 by FDA under an Emergency Use Authorization (EUA).  This EUA will remain in effect (meaning  this test can be used) for the duration of  the COVID-19 declaration under Section 564(b)(1) of the Act, 21 U.S.C. section 360-bbb-3(b)(1), unless the authorization is terminated or revoked sooner.  Performed at [facility], [address] [place], [place]          Radiology Studies: No results found.      Scheduled Meds: . atorvastatin  80 mg Oral q1800  . docusate sodium  100 mg Oral BID  . enoxaparin (LOVENOX) injection  40 mg Subcutaneous Daily  . feeding supplement  237 mL Oral BID BM  . memantine  10 mg Oral QHS  . metoprolol tartrate  12.5 mg Oral BID  . pantoprazole  40 mg Oral Q0600  . rivastigmine  13.3 mg Transdermal Q24H  . venlafaxine XR  37.5 mg Oral Q breakfast   Continuous Infusions: . cefTRIAXone (ROCEPHIN)  IV 1 g (09/01/20 1950)     LOS: 6 days    Time spent: 37 minutes spent on chart review, discussion with nursing staff, consultants, updating family and interview/physical exam; more than 50% of that time was spent in counseling and/or coordination of care.    10/30/20 Alvira Philips, DO [facility] Available via Epic secure chat 7am-7pm After these hours, please refer to coverage provider listed on [email or web address] 09/02/2020, 10:52 AM

## 2020-09-02 NOTE — Plan of Care (Signed)
  Problem: Education: Goal: Knowledge of risk factors and measures for prevention of condition will improve Outcome: Progressing   

## 2020-09-02 NOTE — Plan of Care (Signed)
  Problem: Respiratory: Goal: Will maintain a patent airway Outcome: Progressing   Problem: Nutrition: Goal: Adequate nutrition will be maintained Outcome: Progressing   Problem: Elimination: Goal: Will not experience complications related to urinary retention Outcome: Progressing   Problem: Pain Managment: Goal: General experience of comfort will improve Outcome: Progressing   Problem: Skin Integrity: Goal: Risk for impaired skin integrity will decrease Outcome: Progressing   Problem: Activity: Goal: Ability to ambulate and perform ADLs will improve Outcome: Progressing

## 2020-09-03 DIAGNOSIS — S72001A Fracture of unspecified part of neck of right femur, initial encounter for closed fracture: Secondary | ICD-10-CM | POA: Diagnosis not present

## 2020-09-03 DIAGNOSIS — I609 Nontraumatic subarachnoid hemorrhage, unspecified: Secondary | ICD-10-CM | POA: Diagnosis not present

## 2020-09-03 DIAGNOSIS — I2510 Atherosclerotic heart disease of native coronary artery without angina pectoris: Secondary | ICD-10-CM | POA: Diagnosis not present

## 2020-09-03 DIAGNOSIS — G3183 Dementia with Lewy bodies: Secondary | ICD-10-CM | POA: Diagnosis not present

## 2020-09-03 LAB — URINE CULTURE: Culture: >=100000 — AB

## 2020-09-03 LAB — SARS CORONAVIRUS 2 (TAT 6-24 HRS): SARS Coronavirus 2: NEGATIVE

## 2020-09-03 MED ORDER — FOSFOMYCIN TROMETHAMINE 3 G PO PACK
3.0000 g | PACK | Freq: Once | ORAL | Status: AC
  Administered 2020-09-03: 3 g via ORAL
  Filled 2020-09-03: qty 3

## 2020-09-03 NOTE — Progress Notes (Signed)
PROGRESS NOTE    Tiffany Mack  RFF:[id number] DOB: [birth year] DOA: 08/27/2020 PCP: Mila Palmer, MD    Brief Narrative:  Tiffany Mack is a 80 year old female with past medical history significant for dementia, CAD, essential hypertension who presented from her memory care unit with hip pain and head bruise following mechanical fall.  Patient was in her normal state of health when she was trying to tie her shoes and fell forward out of her chair onto her head.  In the ED, CT notable for right frontal intraparenchymal and subarachnoid hemorrhage without mass-effect, x-ray right hip with femoral neck fracture.  Case was discussed with neurosurgery, Dr. Mickle Mallory guard and orthopedics Dr. Dion Saucier.  Hospital service consulted for further evaluation and management of acute right hip fracture and right frontal intraparenchymal/SAH.   Assessment & Plan:   Principal Problem:   Hip fracture (HCC) Active Problems:   HTN (hypertension)   Dementia (HCC)   CAD (coronary artery disease)   Malnutrition of moderate degree   Subarachnoid hemorrhage (HCC)   Right hip fracture Patient presenting from memory care unit following mechanical fall with right hip pain.  X-ray right hip notable for right hip fracture.  Patient underwent right hip pinning by orthopedics, Dr. Dion Saucier on 08/29/2020. --Weightbearing as tolerates --Lovenox for DVT prophylaxis x4 weeks per Ortho --Tylenol and tramadol prn for pain control --Continue PT/OT efforts while inpatient --Follow up 2 weeks with orthopedics, Dr. Dion Saucier --Pending SNF placement  Subarachnoid hemorrhage Intraparenchymal hemorrhage CT head notable for right frontal intraparenchymal and subarachnoid hemorrhage without mass-effect.  These are small and patient is asymptomatic.  Case was discussed with neurosurgery on-call, Dr. Maurice Small; and no further imaging required unless mental status changes.  Okay for Lovenox DVT prophylaxis postoperatively  after 24 hours since injury.  ESBL Ecoli Urinary tract infection UA with large leukocytes, negative nitrite, many bacteria, greater than 50 WBCs. Urine culture with ESBL ecoli. Change Ceftriaxone to fosfomycin 3 g p.o. x 1 today and will repeat dose in 3 days.  Recent COVID viral infection Patient asymptomatic, originally diagnosed on 08/11/2020.  No need for treatment, oxygenating well on room air.  No further isolation required.  Coronary artery disease Essential hypertension --Atorvastatin 80 mg p.o. daily --Metoprolol tartrate 12.5 mg p.o. twice daily  Dementia --Continue rivastigmine patch, venlafaxine 37.5 mg p.o. daily, Namenda --Delirium precautions --Get up during the day --Encourage a familiar face to remain present throughout the day --Keep blinds open and lights on during daylight hours --Minimize the use of opioids/benzodiazepines  GERD: Continue Protonix 40 mg p.o. daily  DVT prophylaxis: Lovenox   Code Status: DNR Family Communication: No family present at bedside this morning  Disposition Plan:  Level of care: Med-Surg Status is: Inpatient  Remains inpatient appropriate because:Unsafe d/c plan   Dispo: The patient is from: ALF              Anticipated d/c is to: [facility]              Anticipated d/c date is: 1 day              Patient currently is medically stable to d/c.   Difficult to place patient No    Consultants:   Orthopedics, Dr. Dion Saucier  Neurosurgery, Dr. Maurice Small  Procedures:   Right hip pinning, Dr. Dion Saucier 08/29/2020  Antimicrobials:   Perioperative cefazolin  Ceftriaxone 2/4 - 2/6  Fosfomycin x 1 2/6   Subjective: Patient seen and examined bedside, resting comfortably.  No  family present.  Pleasantly confused.  No specific complaints this morning.  Awaiting placement at SNF.  Denies headache, no chest pain, palpitations, no shortness of breath, no abdominal pain.  No acute events overnight per nursing  staff.  Objective: Vitals:   09/02/20 0445 09/02/20 1437 09/02/20 2028 09/03/20 0427  BP: (!) 145/54 (!) 115/59 (!) 113/53 (!) 145/56  Pulse: 73 75 74 71  Resp: 16 12 12 16   Temp: 98.2 F (36.8 C) 97.6 F (36.4 C) 98.5 F (36.9 C) 98.5 F (36.9 C)  TempSrc: Oral Oral Oral Oral  SpO2: 96% 97% 96% 99%  Weight:      Height:        Intake/Output Summary (Last 24 hours) at 09/03/2020 1103 Last data filed at 09/03/2020 0848 Gross per 24 hour  Intake 756 ml  Output 1250 ml  Net -494 ml   Filed Weights   08/27/20 2015  Weight: 72.4 kg    Examination:  General exam: Appears calm and comfortable, pleasantly confused Respiratory system: Clear to auscultation. Respiratory effort normal.  Oxygen well on room air Cardiovascular system: S1 & S2 heard, RRR. No JVD, murmurs, rubs, gallops or clicks. No pedal edema. Gastrointestinal system: Abdomen is nondistended, soft and nontender. No organomegaly or masses felt. Normal bowel sounds heard. Central nervous system: Alert, not oriented to person/place/time/situation. No focal neurological deficits. Extremities: Symmetric 5 x 5 power. Skin: No rashes, lesions or ulcers, surgical site noted with dressings in place, C/D/I Psychiatry: Judgement and insight appear poor. Mood & affect appropriate.     Data Reviewed: I have personally reviewed following labs and imaging studies  CBC: Recent Labs  Lab 08/27/20 1503 08/28/20 0340 08/29/20 0332 08/30/20 0403 08/31/20 0343 09/01/20 0344 09/02/20 0416  WBC 13.7*   < > 10.4 9.7 8.6 7.9 7.3  NEUTROABS 10.8*  --  7.8*  --   --   --   --   HGB 14.1   < > 14.2 14.0 12.5 11.6* 12.0  HCT 43.5   < > 43.8 43.5 39.0 36.2 36.6  MCV 90.1   < > 89.2 89.3 90.1 89.6 89.9  PLT 221   < > 211 197 169 180 193   < > = values in this interval not displayed.   Basic Metabolic Panel: Recent Labs  Lab 08/28/20 0340 08/29/20 0332 08/30/20 0403 08/31/20 0343 09/02/20 0416  NA 140 140 140 141 136  K 3.8  3.4* 3.5 3.3* 3.9  CL 101 100 103 103 104  CO2 28 29 25 26 25   GLUCOSE 105* 125* 142* 114* 120*  BUN 11 8 10 11 14   CREATININE 0.56 0.61 0.65 0.67 0.54  CALCIUM 9.3 9.3 8.9 8.5* 8.8*   GFR: Estimated Creatinine Clearance: 59.6 mL/min (by C-G formula based on SCr of 0.54 mg/dL). Liver Function Tests: No results for input(s): AST, ALT, ALKPHOS, BILITOT, PROT, ALBUMIN in the last 168 hours. No results for input(s): LIPASE, AMYLASE in the last 168 hours. No results for input(s): AMMONIA in the last 168 hours. Coagulation Profile: Recent Labs  Lab 08/27/20 1503  INR 1.0   Cardiac Enzymes: No results for input(s): CKTOTAL, CKMB, CKMBINDEX, TROPONINI in the last 168 hours. BNP (last 3 results) No results for input(s): PROBNP in the last 8760 hours. HbA1C: No results for input(s): HGBA1C in the last 72 hours. CBG: No results for input(s): GLUCAP in the last 168 hours. Lipid Profile: No results for input(s): CHOL, HDL, LDLCALC, TRIG, CHOLHDL, LDLDIRECT in the last  72 hours. Thyroid Function Tests: No results for input(s): TSH, T4TOTAL, FREET4, T3FREE, THYROIDAB in the last 72 hours. Anemia Panel: No results for input(s): VITAMINB12, FOLATE, FERRITIN, TIBC, IRON, RETICCTPCT in the last 72 hours. Sepsis Labs: No results for input(s): PROCALCITON, LATICACIDVEN in the last 168 hours.  Recent Results (from the past 240 hour(s))  SARS Coronavirus 2 by RT PCR (hospital order, performed in [facility] hospital lab) Nasopharyngeal Nasopharyngeal Swab     Status: Abnormal   Collection Time: 08/27/20  4:08 PM   Specimen: Nasopharyngeal Swab  Result Value Ref Range Status   SARS Coronavirus 2 POSITIVE (A) NEGATIVE Final    Comment: RESULT CALLED TO, READ BACK BY AND VERIFIED WITH: Harle Stanford RN @1800  ON 08/27/20 JACKSON,K (NOTE) SARS-CoV-2 target nucleic acids are DETECTED  SARS-CoV-2 RNA is generally detectable in upper respiratory specimens  during the acute phase of infection.   Positive results are indicative  of the presence of the identified virus, but do not rule out bacterial infection or co-infection with other pathogens not detected by the test.  Clinical correlation with patient history and  other diagnostic information is necessary to determine patient infection status.  The expected result is negative.  Fact Sheet for Patients:   08/29/20   Fact Sheet for Healthcare Providers:   BoilerBrush.com.cy    This test is not yet approved or cleared by the https://pope.com/ FDA and  has been authorized for detection and/or diagnosis of SARS-CoV-2 by FDA under an Emergency Use Authorization (EUA).  This EUA will remain in effect (meaning  this test can be used) for the duration of  the COVID-19 declaration under Section 564(b)(1) of the Act, 21 U.S.C. section 360-bbb-3(b)(1), unless the authorization is terminated or revoked sooner.  Performed at [facility], [address] [place], [place]   Culture, Urine     Status: Abnormal   Collection Time: 09/01/20  1:42 PM   Specimen: Urine, Catheterized  Result Value Ref Range Status   Specimen Description   Final    URINE, CATHETERIZED Performed at [facility], [address] [place], [place]    Special Requests   Final    NONE Performed at [facility], [address], [place], [place]    Culture (A)  Final    >=100,000 COLONIES/mL ESCHERICHIA COLI Confirmed Extended Spectrum Beta-Lactamase Producer (ESBL).  In bloodstream infections from ESBL organisms, carbapenems are preferred over piperacillin/tazobactam. They are shown to have a lower risk of mortality.    Report Status 09/03/2020 FINAL  Final   Organism ID, Bacteria ESCHERICHIA COLI (A)  Final      Susceptibility   Escherichia coli - MIC*    AMPICILLIN >=32 RESISTANT Resistant     CEFAZOLIN >=64 RESISTANT  Resistant     CEFEPIME >=32 RESISTANT Resistant     CEFTRIAXONE >=64 RESISTANT Resistant     CIPROFLOXACIN >=4 RESISTANT Resistant     GENTAMICIN 2 SENSITIVE Sensitive     IMIPENEM <=0.25 SENSITIVE Sensitive     NITROFURANTOIN <=16 SENSITIVE Sensitive     TRIMETH/SULFA >=320 RESISTANT Resistant     AMPICILLIN/SULBACTAM >=32 RESISTANT Resistant     PIP/TAZO 16 SENSITIVE Sensitive     * >=100,000 COLONIES/mL ESCHERICHIA COLI         Radiology Studies: No results found.      Scheduled Meds: . atorvastatin  80 mg Oral q1800  . docusate sodium  100 mg Oral BID  .  enoxaparin (LOVENOX) injection  40 mg Subcutaneous Daily  . feeding supplement  237 mL Oral BID BM  . fosfomycin  3 g Oral Once  . memantine  10 mg Oral QHS  . metoprolol tartrate  12.5 mg Oral BID  . pantoprazole  40 mg Oral Q0600  . rivastigmine  13.3 mg Transdermal Q24H  . venlafaxine XR  37.5 mg Oral Q breakfast   Continuous Infusions:    LOS: 7 days    Time spent: 36 minutes spent on chart review, discussion with nursing staff, consultants, updating family and interview/physical exam; more than 50% of that time was spent in counseling and/or coordination of care.    Alvira Philips [place], DO [facility] Available via Epic secure chat 7am-7pm After these hours, please refer to coverage provider listed on [email or web address] 09/03/2020, 11:03 AM

## 2020-09-04 DIAGNOSIS — U071 COVID-19: Secondary | ICD-10-CM | POA: Diagnosis not present

## 2020-09-04 DIAGNOSIS — Z4789 Encounter for other orthopedic aftercare: Secondary | ICD-10-CM | POA: Diagnosis not present

## 2020-09-04 DIAGNOSIS — I10 Essential (primary) hypertension: Secondary | ICD-10-CM | POA: Diagnosis not present

## 2020-09-04 DIAGNOSIS — W19XXXA Unspecified fall, initial encounter: Secondary | ICD-10-CM | POA: Diagnosis not present

## 2020-09-04 DIAGNOSIS — N390 Urinary tract infection, site not specified: Secondary | ICD-10-CM | POA: Diagnosis not present

## 2020-09-04 DIAGNOSIS — I609 Nontraumatic subarachnoid hemorrhage, unspecified: Secondary | ICD-10-CM | POA: Diagnosis not present

## 2020-09-04 DIAGNOSIS — R69 Illness, unspecified: Secondary | ICD-10-CM | POA: Diagnosis not present

## 2020-09-04 DIAGNOSIS — S72001A Fracture of unspecified part of neck of right femur, initial encounter for closed fracture: Secondary | ICD-10-CM | POA: Diagnosis not present

## 2020-09-04 DIAGNOSIS — M5136 Other intervertebral disc degeneration, lumbar region: Secondary | ICD-10-CM | POA: Diagnosis not present

## 2020-09-04 DIAGNOSIS — Z1612 Extended spectrum beta lactamase (ESBL) resistance: Secondary | ICD-10-CM | POA: Diagnosis not present

## 2020-09-04 DIAGNOSIS — Z743 Need for continuous supervision: Secondary | ICD-10-CM | POA: Diagnosis not present

## 2020-09-04 DIAGNOSIS — S066X0S Traumatic subarachnoid hemorrhage without loss of consciousness, sequela: Secondary | ICD-10-CM | POA: Diagnosis not present

## 2020-09-04 DIAGNOSIS — I2510 Atherosclerotic heart disease of native coronary artery without angina pectoris: Secondary | ICD-10-CM | POA: Diagnosis not present

## 2020-09-04 DIAGNOSIS — Z79899 Other long term (current) drug therapy: Secondary | ICD-10-CM | POA: Diagnosis not present

## 2020-09-04 DIAGNOSIS — K219 Gastro-esophageal reflux disease without esophagitis: Secondary | ICD-10-CM | POA: Diagnosis not present

## 2020-09-04 DIAGNOSIS — I959 Hypotension, unspecified: Secondary | ICD-10-CM | POA: Diagnosis not present

## 2020-09-04 DIAGNOSIS — G3183 Dementia with Lewy bodies: Secondary | ICD-10-CM | POA: Diagnosis not present

## 2020-09-04 DIAGNOSIS — E440 Moderate protein-calorie malnutrition: Secondary | ICD-10-CM | POA: Diagnosis not present

## 2020-09-04 DIAGNOSIS — B9629 Other Escherichia coli [E. coli] as the cause of diseases classified elsewhere: Secondary | ICD-10-CM | POA: Diagnosis not present

## 2020-09-04 DIAGNOSIS — M5030 Other cervical disc degeneration, unspecified cervical region: Secondary | ICD-10-CM | POA: Diagnosis not present

## 2020-09-04 DIAGNOSIS — E785 Hyperlipidemia, unspecified: Secondary | ICD-10-CM | POA: Diagnosis not present

## 2020-09-04 DIAGNOSIS — R279 Unspecified lack of coordination: Secondary | ICD-10-CM | POA: Diagnosis not present

## 2020-09-04 DIAGNOSIS — S7291XS Unspecified fracture of right femur, sequela: Secondary | ICD-10-CM | POA: Diagnosis not present

## 2020-09-04 MED ORDER — FOSFOMYCIN TROMETHAMINE 3 G PO PACK: 3.0000 g | PACK | Freq: Once | ORAL | 0 refills | Status: AC

## 2020-09-04 MED ORDER — LORAZEPAM 0.5 MG PO TABS: 0.5000 mg | ORAL_TABLET | Freq: Three times a day (TID) | ORAL | 0 refills | Status: DC | PRN

## 2020-09-04 NOTE — Progress Notes (Signed)
Report called to receiving facility twice, no one answers the phone. Will try again latter

## 2020-09-04 NOTE — TOC Transition Note (Signed)
Transition of Care [facility]) - CM/SW Discharge Note   Patient Details  Name: Tiffany Mack MRN: [id number] Date of Birth: 1941/06/27  [facility] CM/SW Contact:  Ida Rogue, LCSW Phone Number: 09/04/2020, 11:38 AM   Clinical Narrative:   Patient who is stable for discharge will transfer to [facility] today.  Son alerted.  PTAR arranged.  Nursing, please call report to [phone], room 611A. TOC sign off.    Final next level of care: Skilled Nursing Facility Barriers to Discharge: Barriers Resolved   Patient Goals and CMS Choice     Choice offered to / list presented to : Adult Children  Discharge Placement                       Discharge Plan and Services   Discharge Planning Services: CM Consult Post Acute Care Choice: Skilled Nursing Facility                               Social Determinants of Health (SDOH) Interventions     Readmission Risk Interventions No flowsheet data found.

## 2020-09-04 NOTE — Plan of Care (Signed)
  Problem: Elimination: Goal: Will not experience complications related to bowel motility Outcome: Progressing   Problem: Safety: Goal: Ability to remain free from injury will improve Outcome: Progressing   

## 2020-09-04 NOTE — Discharge Summary (Signed)
Physician Discharge Summary  Massie Cogliano ZOX:[id number] DOB: [birth year] DOA: 08/27/2020  PCP: Mila Palmer, MD  Admit date: 08/27/2020 Discharge date: 09/04/2020  Admitted From: ALF Disposition: [facility]  Recommendations for Outpatient Follow-up:  1. Follow up with PCP in 1-2 weeks 2. Follow-up with orthopedics, Dr. Dion Saucier in 2 weeks 3. Repeat dose of fosfomycin 3 g p.o. on 09/05/2020 for ESBL E. coli UTI 4. Continue Lovenox DVT prophylaxis x4 weeks per orthopedics 5. Please obtain BMP/CBC in one week  Discharge Condition: Stable CODE STATUS: DNR Diet recommendation: Regular diet  History of present illness:  Janisa Labus is a 80 year old female with past medical history significant for dementia, CAD, essential hypertension who presented from her memory care unit with hip pain and head bruise following mechanical fall.  Patient was in her normal state of health when she was trying to tie her shoes and fell forward out of her chair onto her head.  In the ED, CT notable for right frontal intraparenchymal and subarachnoid hemorrhage without mass-effect, x-ray right hip with femoral neck fracture.  Case was discussed with neurosurgery, Dr. Mickle Mallory guard and orthopedics Dr. Dion Saucier.  Hospital service consulted for further evaluation and management of acute right hip fracture and right frontal intraparenchymal/SAH.   Hospital course:  Right hip fracture Patient presenting from memory care unit following mechanical fall with right hip pain.  X-ray right hip notable for right hip fracture.  Patient underwent right hip pinning by orthopedics, Dr. Dion Saucier on 08/29/2020. Weightbearing as tolerates. Lovenox for DVT prophylaxis x4 weeks per Ortho. Tylenol and tramadol prn for pain control.  Discharging to SNF for further rehabilitation. Follow up 2 weeks with orthopedics, Dr. Dion Saucier  Subarachnoid hemorrhage Intraparenchymal hemorrhage CT head notable for right  frontal intraparenchymal and subarachnoid hemorrhage without mass-effect.  These are small and patient is asymptomatic.  Case was discussed with neurosurgery on-call, Dr. Maurice Small; and no further imaging required unless mental status changes.  Okay for Lovenox DVT prophylaxis postoperatively after 24 hours since injury.  ESBL Ecoli Urinary tract infection UA with large leukocytes, negative nitrite, many bacteria, greater than 50 WBCs. Urine culture with ESBL ecoli.  Received fosfomycin 3 g p.o. x 1, and will repeat dose on 09/05/2020.  Recent COVID viral infection Patient asymptomatic, originally diagnosed on 08/11/2020.  No need for treatment, oxygenating well on room air.  No further isolation required.  Coronary artery disease Essential hypertension Atorvastatin 80 mg p.o. daily, Metoprolol tartrate 12.5 mg p.o. twice daily  Dementia Continue rivastigmine patch, venlafaxine 37.5 mg p.o. daily, Namenda, Ativan as needed for agitation  GERD: Continue Protonix 40 mg p.o. daily  Discharge Diagnoses:  Active Problems:   HTN (hypertension)   Dementia (HCC)   CAD (coronary artery disease)   Malnutrition of moderate degree    Discharge Instructions  Discharge Instructions    Diet - low sodium heart healthy   Complete by: As directed    Increase activity slowly   Complete by: As directed    No wound care   Complete by: As directed      Allergies as of 09/04/2020   No Known Allergies     Medication List    TAKE these medications   acetaminophen 325 MG tablet Commonly known as: Tylenol Take 1 tablet (325 mg total) by mouth every 6 (six) hours as needed for mild pain or moderate pain.   aspirin 81 MG EC tablet Take 1 tablet (81 mg total) by mouth daily.   atorvastatin 80  MG tablet Commonly known as: LIPITOR Take 1 tablet (80 mg total) by mouth daily at 6 PM.   COPPERTONE SPORT SPF 30 EX Apply 1 application topically every 2 (two) hours as needed. While in the sun    dimethicone-zinc oxide cream Apply 1 application topically in the morning, at noon, and at bedtime.   enoxaparin 40 MG/0.4ML injection Commonly known as: Lovenox Inject 0.4 mLs (40 mg total) into the skin daily.   fosfomycin 3 g Pack Commonly known as: MONUROL Take 3 g by mouth once for 1 dose. Start taking on: September 05, 2020   LORazepam 0.5 MG tablet Commonly known as: ATIVAN Take 1 tablet (0.5 mg total) by mouth 3 (three) times daily as needed (agitation).   memantine 10 MG tablet Commonly known as: NAMENDA Take 1 tablet every night for 2 weeks, then increase to 1 tablet twice a day What changed:   how much to take  how to take this  when to take this  additional instructions   metoprolol tartrate 25 MG tablet Commonly known as: LOPRESSOR Take 0.5 tablets (12.5 mg total) by mouth 2 (two) times daily.   mirtazapine 30 MG tablet Commonly known as: REMERON Take 30 mg by mouth at bedtime.   multivitamin-iron-minerals-folic acid Tabs tablet Take 1 tablet by mouth daily.   nitroGLYCERIN 0.4 MG SL tablet Commonly known as: NITROSTAT Place 1 tablet (0.4 mg total) under the tongue every 5 (five) minutes as needed for chest pain (CP or SOB).   ondansetron 8 MG tablet Commonly known as: ZOFRAN Take 8 mg by mouth every 8 (eight) hours as needed for nausea/vomiting.   pantoprazole 40 MG tablet Commonly known as: PROTONIX Take 1 tablet (40 mg total) by mouth daily at 6 (six) AM.   rivastigmine 13.3 MG/24HR Commonly known as: EXELON Place 13.3 mg onto the skin daily.   Secura Protective 10 % Crea Generic drug: ZINC OXIDE (TOPICAL) Apply 1 application topically 2 (two) times daily as needed (burn & irritation).   traMADol 50 MG tablet Commonly known as: Ultram Take 1 tablet (50 mg total) by mouth every 12 (twelve) hours as needed for severe pain.   venlafaxine XR 37.5 MG 24 hr capsule Commonly known as: EFFEXOR-XR Take 37.5 mg by mouth daily with breakfast.        Follow-up Information    Teryl Lucy, MD. Schedule an appointment as soon as possible for a visit in 2 weeks.   Specialty: Orthopedic Surgery Contact information: [address] [place] [address] [phone]        Mila Palmer, MD. Schedule an appointment as soon as possible for a visit in 1 week(s).   Specialty: Family Medicine Contact information: [address] [place] [address] [phone]              No Known Allergies  Consultations:  Orthopedics, Dr. Dion Saucier   Procedures/Studies: CT Head Wo Contrast  Result Date: 08/27/2020 CLINICAL DATA:  Right scalp hematoma following a fall. EXAM: CT HEAD WITHOUT CONTRAST CT CERVICAL SPINE WITHOUT CONTRAST TECHNIQUE: Multidetector CT imaging of the head and cervical spine was performed following the standard protocol without intravenous contrast. Multiplanar CT image reconstructions of the cervical spine were also generated. COMPARISON:  05/10/2020 FINDINGS: CT HEAD FINDINGS Brain: Multiple areas of parenchymal and subarachnoid hemorrhage in the right frontal region. No intraventricular hemorrhage is seen. No mass effect. Stable moderately enlarged ventricles and subarachnoid spaces. Stable moderate patchy white matter  low density in both cerebral hemispheres. No intracranial mass or CT evidence of acute infarction. Vascular: No hyperdense vessel or unexpected calcification. Skull: Normal. Negative for fracture or focal lesion. Sinuses/Orbits: Status post bilateral cataract extraction. Unremarkable bones and included paranasal sinuses. Other: Large right lateral frontal scalp hematoma. Deviation of the midportion of the nasal septum to the right. CT CERVICAL SPINE FINDINGS Alignment: Normal. Skull base and vertebrae: No acute fracture. No primary bone lesion or focal pathologic process. Soft tissues and spinal canal: No prevertebral fluid or swelling. No visible canal hematoma.  Disc levels: Previously demonstrated multilevel degenerative changes. Upper chest: Biapical parenchymal scarring. Other: Bilateral carotid artery calcifications. IMPRESSION: 1. Multiple areas of parenchymal and subarachnoid hemorrhage in the right frontal region. 2. Large right lateral frontal scalp hematoma without skull fracture. 3. No cervical spine fracture or subluxation. 4. Stable moderate diffuse cerebral and cerebellar atrophy and moderate chronic small vessel white matter ischemic changes in both cerebral hemispheres. 5. Previously demonstrated multilevel cervical spine degenerative changes. 6. Bilateral carotid artery atheromatous calcifications. Critical Value/emergent results were called by telephone at the time of interpretation on 08/27/2020 at 2:09 pm to provider [facility] , who verbally acknowledged these results. Electronically Signed   By: Beckie Salts M.D.   On: 08/27/2020 14:13   CT Cervical Spine Wo Contrast  Result Date: 08/27/2020 CLINICAL DATA:  Right scalp hematoma following a fall. EXAM: CT HEAD WITHOUT CONTRAST CT CERVICAL SPINE WITHOUT CONTRAST TECHNIQUE: Multidetector CT imaging of the head and cervical spine was performed following the standard protocol without intravenous contrast. Multiplanar CT image reconstructions of the cervical spine were also generated. COMPARISON:  05/10/2020 FINDINGS: CT HEAD FINDINGS Brain: Multiple areas of parenchymal and subarachnoid hemorrhage in the right frontal region. No intraventricular hemorrhage is seen. No mass effect. Stable moderately enlarged ventricles and subarachnoid spaces. Stable moderate patchy white matter low density in both cerebral hemispheres. No intracranial mass or CT evidence of acute infarction. Vascular: No hyperdense vessel or unexpected calcification. Skull: Normal. Negative for fracture or focal lesion. Sinuses/Orbits: Status post bilateral cataract extraction. Unremarkable bones and included paranasal sinuses. Other:  Large right lateral frontal scalp hematoma. Deviation of the midportion of the nasal septum to the right. CT CERVICAL SPINE FINDINGS Alignment: Normal. Skull base and vertebrae: No acute fracture. No primary bone lesion or focal pathologic process. Soft tissues and spinal canal: No prevertebral fluid or swelling. No visible canal hematoma. Disc levels: Previously demonstrated multilevel degenerative changes. Upper chest: Biapical parenchymal scarring. Other: Bilateral carotid artery calcifications. IMPRESSION: 1. Multiple areas of parenchymal and subarachnoid hemorrhage in the right frontal region. 2. Large right lateral frontal scalp hematoma without skull fracture. 3. No cervical spine fracture or subluxation. 4. Stable moderate diffuse cerebral and cerebellar atrophy and moderate chronic small vessel white matter ischemic changes in both cerebral hemispheres. 5. Previously demonstrated multilevel cervical spine degenerative changes. 6. Bilateral carotid artery atheromatous calcifications. Critical Value/emergent results were called by telephone at the time of interpretation on 08/27/2020 at 2:09 pm to provider [facility] , who verbally acknowledged these results. Electronically Signed   By: Beckie Salts M.D.   On: 08/27/2020 14:13   Pelvis Portable  Result Date: 08/29/2020 CLINICAL DATA:  Postop hip pinning today. EXAM: PORTABLE PELVIS 1-2 VIEWS COMPARISON:  Intraoperative fluoroscopic images earlier today. Right hip radiographs 08/27/2020. FINDINGS: Three cannulated screws are again noted traversing the right femoral neck and terminating in the femoral head for treatment of a nondisplaced femoral neck fracture. Alignment appears anatomic.  The femoral heads are approximated with the acetabula on this single projection. No new fracture is identified. IMPRESSION: Status post right hip pinning. Electronically Signed   By: Sebastian Ache M.D.   On: 08/29/2020 16:20   DG C-Arm 1-60 Min-No Report  Result Date:  08/29/2020 Fluoroscopy was utilized by the requesting physician.  No radiographic interpretation.   DG Hip Port Unilat With Pelvis 1V Right  Result Date: 08/29/2020 CLINICAL DATA:  80 year old female status post right hip pinning. EXAM: DG HIP (WITH OR WITHOUT PELVIS) 1V PORT RIGHT COMPARISON:  Earlier intraoperative fluoroscopic image dated 08/29/2020. FINDINGS: Single cross-table lateral view provided. Evaluation is very limited due to soft tissue attenuation. Three fixation pins noted in the right femoral neck. IMPRESSION: Status post pinning of the right femoral neck. Electronically Signed   By: Elgie Collard M.D.   On: 08/29/2020 16:09   DG HIP OPERATIVE UNILAT W OR W/O PELVIS RIGHT  Result Date: 08/29/2020 CLINICAL DATA:  Elective surgery. EXAM: OPERATIVE RIGHT HIP (WITH PELVIS IF PERFORMED) 3 VIEWS TECHNIQUE: Fluoroscopic spot image(s) were submitted for interpretation post-operatively. COMPARISON:  Right hip radiographs 08/27/2020 FINDINGS: Three intraoperative spot fluoroscopic images demonstrate placement of 3 screws for fixation of the previously demonstrated impacted, nondisplaced femoral neck fracture. All 3 screws terminate within the margins of the femoral head. There is no dislocation. IMPRESSION: Intraoperative images during right hip pinning. Electronically Signed   By: Sebastian Ache M.D.   On: 08/29/2020 15:43   DG Hip Unilat W or Wo Pelvis 2-3 Views Right  Result Date: 08/27/2020 CLINICAL DATA:  Difficulty raising the right leg following a fall. EXAM: DG HIP (WITH OR WITHOUT PELVIS) 2-3V RIGHT COMPARISON:  None. FINDINGS: Essentially nondisplaced right femoral neck fracture with no significant angulation. Lower lumbar spine degenerative changes. IMPRESSION: Essentially nondisplaced right femoral neck fracture. Electronically Signed   By: Beckie Salts M.D.   On: 08/27/2020 13:40      Subjective: Patient seen and examined bedside, resting comfortably.  Pleasantly confused.   Discharging to SNF today.  No complaints.  Denies chest pain, no shortness of breath, no abdominal pain.  No acute events overnight per nursing staff.  Discharge Exam: Vitals:   09/03/20 2008 09/04/20 0503  BP: 121/73 (!) 138/52  Pulse: 85 79  Resp: 16 16  Temp: 97.8 F (36.6 C) 97.7 F (36.5 C)  SpO2: 96% 96%   Vitals:   09/03/20 0427 09/03/20 1514 09/03/20 2008 09/04/20 0503  BP: (!) 145/56 117/71 121/73 (!) 138/52  Pulse: 71 82 85 79  Resp: 16 17 16 16   Temp: 98.5 F (36.9 C) 98 F (36.7 C) 97.8 F (36.6 C) 97.7 F (36.5 C)  TempSrc: Oral Oral Oral Oral  SpO2: 99% 97% 96% 96%  Weight:      Height:        General: Pt is alert, awake, not in acute distress, pleasantly confused Cardiovascular: RRR, S1/S2 +, no rubs, no gallops Respiratory: CTA bilaterally, no wheezing, no rhonchi, on room air Abdominal: Soft, NT, ND, bowel sounds + Extremities: no edema, no cyanosis, surgical dressing noted, clean/dry/intact    The results of significant diagnostics from this hospitalization (including imaging, microbiology, ancillary and laboratory) are listed below for reference.     Microbiology: Recent Results (from the past 240 hour(s))  SARS Coronavirus 2 by RT PCR (hospital order, performed in [facility] hospital lab) Nasopharyngeal Nasopharyngeal Swab     Status: Abnormal   Collection Time: 08/27/20  4:08 PM  Specimen: Nasopharyngeal Swab  Result Value Ref Range Status   SARS Coronavirus 2 POSITIVE (A) NEGATIVE Final    Comment: RESULT CALLED TO, READ BACK BY AND VERIFIED WITH: Harle Stanford RN @1800  ON 08/27/20 JACKSON,K (NOTE) SARS-CoV-2 target nucleic acids are DETECTED  SARS-CoV-2 RNA is generally detectable in upper respiratory specimens  during the acute phase of infection.  Positive results are indicative  of the presence of the identified virus, but do not rule out bacterial infection or co-infection with other pathogens not detected by the test.  Clinical  correlation with patient history and  other diagnostic information is necessary to determine patient infection status.  The expected result is negative.  Fact Sheet for Patients:   08/29/20   Fact Sheet for Healthcare Providers:   BoilerBrush.com.cy    This test is not yet approved or cleared by the https://pope.com/ FDA and  has been authorized for detection and/or diagnosis of SARS-CoV-2 by FDA under an Emergency Use Authorization (EUA).  This EUA will remain in effect (meaning  this test can be used) for the duration of  the COVID-19 declaration under Section 564(b)(1) of the Act, 21 U.S.C. section 360-bbb-3(b)(1), unless the authorization is terminated or revoked sooner.  Performed at [facility], [address] [place], [place]   Culture, Urine     Status: Abnormal   Collection Time: 09/01/20  1:42 PM   Specimen: Urine, Catheterized  Result Value Ref Range Status   Specimen Description   Final    URINE, CATHETERIZED Performed at [facility], [address], [place], [place]    Special Requests   Final    NONE Performed at [facility], [address] [place], [place]    Culture (A)  Final    >=100,000 COLONIES/mL ESCHERICHIA COLI Confirmed Extended Spectrum Beta-Lactamase Producer (ESBL).  In bloodstream infections from ESBL organisms, carbapenems are preferred over piperacillin/tazobactam. They are shown to have a lower risk of mortality.    Report Status 09/03/2020 FINAL  Final   Organism ID, Bacteria ESCHERICHIA COLI (A)  Final      Susceptibility   Escherichia coli - MIC*    AMPICILLIN >=32 RESISTANT Resistant     CEFAZOLIN >=64 RESISTANT Resistant     CEFEPIME >=32 RESISTANT Resistant     CEFTRIAXONE >=64 RESISTANT Resistant     CIPROFLOXACIN >=4 RESISTANT Resistant     GENTAMICIN 2 SENSITIVE Sensitive     IMIPENEM  <=0.25 SENSITIVE Sensitive     NITROFURANTOIN <=16 SENSITIVE Sensitive     TRIMETH/SULFA >=320 RESISTANT Resistant     AMPICILLIN/SULBACTAM >=32 RESISTANT Resistant     PIP/TAZO 16 SENSITIVE Sensitive     * >=100,000 COLONIES/mL ESCHERICHIA COLI  SARS CORONAVIRUS 2 (TAT 6-24 HRS) Nasopharyngeal Nasopharyngeal Swab     Status: None   Collection Time: 09/03/20 11:10 AM   Specimen: Nasopharyngeal Swab  Result Value Ref Range Status   SARS Coronavirus 2 NEGATIVE NEGATIVE Final    Comment: (NOTE) SARS-CoV-2 target nucleic acids are NOT DETECTED.  The SARS-CoV-2 RNA is generally detectable in upper and lower respiratory specimens during the acute phase of infection. Negative results do not preclude SARS-CoV-2 infection, do not rule out co-infections with other pathogens, and should not be used as the sole basis for treatment or other patient management decisions. Negative results must be combined with clinical observations, patient history, and epidemiological information. The expected result is Negative.  Fact Sheet for Patients:  HairSlick.no  Fact Sheet for Healthcare Providers: quierodirigir.com  This test is not yet approved or cleared by the [place] FDA and  has been authorized for detection and/or diagnosis of SARS-CoV-2 by FDA under an Emergency Use Authorization (EUA). This EUA will remain  in effect (meaning this test can be used) for the duration of the COVID-19 declaration under Se ction 564(b)(1) of the Act, 21 U.S.C. section 360bbb-3(b)(1), unless the authorization is terminated or revoked sooner.  Performed at [facility], [address] [place], [place] [address]      Labs: BNP (last 3 results) No results for input(s): BNP in the last 8760 hours. Basic Metabolic Panel: Recent Labs  Lab 08/29/20 0332 08/30/20 0403 08/31/20 0343 09/02/20 0416  NA 140 140 141 136  K 3.4* 3.5 3.3* 3.9  CL 100  103 103 104  CO2 29 25 26 25   GLUCOSE 125* 142* 114* 120*  BUN 8 10 11 14   CREATININE 0.61 0.65 0.67 0.54  CALCIUM 9.3 8.9 8.5* 8.8*   Liver Function Tests: No results for input(s): AST, ALT, ALKPHOS, BILITOT, PROT, ALBUMIN in the last 168 hours. No results for input(s): LIPASE, AMYLASE in the last 168 hours. No results for input(s): AMMONIA in the last 168 hours. CBC: Recent Labs  Lab 08/29/20 0332 08/30/20 0403 08/31/20 0343 09/01/20 0344 09/02/20 0416  WBC 10.4 9.7 8.6 7.9 7.3  NEUTROABS 7.8*  --   --   --   --   HGB 14.2 14.0 12.5 11.6* 12.0  HCT 43.8 43.5 39.0 36.2 36.6  MCV 89.2 89.3 90.1 89.6 89.9  PLT 211 197 169 180 193   Cardiac Enzymes: No results for input(s): CKTOTAL, CKMB, CKMBINDEX, TROPONINI in the last 168 hours. BNP: Invalid input(s): POCBNP CBG: No results for input(s): GLUCAP in the last 168 hours. D-Dimer No results for input(s): DDIMER in the last 72 hours. Hgb A1c No results for input(s): HGBA1C in the last 72 hours. Lipid Profile No results for input(s): CHOL, HDL, LDLCALC, TRIG, CHOLHDL, LDLDIRECT in the last 72 hours. Thyroid function studies No results for input(s): TSH, T4TOTAL, T3FREE, THYROIDAB in the last 72 hours.  Invalid input(s): FREET3 Anemia work up No results for input(s): VITAMINB12, FOLATE, FERRITIN, TIBC, IRON, RETICCTPCT in the last 72 hours. Urinalysis    Component Value Date/Time   COLORURINE YELLOW 09/01/2020 1342   APPEARANCEUR TURBID (A) 09/01/2020 1342   LABSPEC 1.013 09/01/2020 1342   PHURINE 6.0 09/01/2020 1342   GLUCOSEU NEGATIVE 09/01/2020 1342   HGBUR SMALL (A) 09/01/2020 1342   BILIRUBINUR NEGATIVE 09/01/2020 1342   KETONESUR NEGATIVE 09/01/2020 1342   PROTEINUR NEGATIVE 09/01/2020 1342   NITRITE NEGATIVE 09/01/2020 1342   LEUKOCYTESUR LARGE (A) 09/01/2020 1342   Sepsis Labs Invalid input(s): PROCALCITONIN,  WBC,  LACTICIDVEN Microbiology Recent Results (from the past 240 hour(s))  SARS Coronavirus 2  by RT PCR (hospital order, performed in [facility] hospital lab) Nasopharyngeal Nasopharyngeal Swab     Status: Abnormal   Collection Time: 08/27/20  4:08 PM   Specimen: Nasopharyngeal Swab  Result Value Ref Range Status   SARS Coronavirus 2 POSITIVE (A) NEGATIVE Final    Comment: RESULT CALLED TO, READ BACK BY AND VERIFIED WITH: [facility] RN @1800  ON 08/27/20 JACKSON,K (NOTE) SARS-CoV-2 target nucleic acids are DETECTED  SARS-CoV-2 RNA is generally detectable in upper respiratory specimens  during the acute phase of infection.  Positive results are indicative  of the presence of the identified virus, but  do not rule out bacterial infection or co-infection with other pathogens not detected by the test.  Clinical correlation with patient history and  other diagnostic information is necessary to determine patient infection status.  The expected result is negative.  Fact Sheet for Patients:   BoilerBrush.com.cy   Fact Sheet for Healthcare Providers:   https://pope.com/    This test is not yet approved or cleared by the [place] FDA and  has been authorized for detection and/or diagnosis of SARS-CoV-2 by FDA under an Emergency Use Authorization (EUA).  This EUA will remain in effect (meaning  this test can be used) for the duration of  the COVID-19 declaration under Section 564(b)(1) of the Act, 21 U.S.C. section 360-bbb-3(b)(1), unless the authorization is terminated or revoked sooner.  Performed at [facility], [address] [place], [place] [address]   Culture, Urine     Status: Abnormal   Collection Time: 09/01/20  1:42 PM   Specimen: Urine, Catheterized  Result Value Ref Range Status   Specimen Description   Final    URINE, CATHETERIZED Performed at [facility], [address] [place], [place] [address]    Special Requests   Final    NONE Performed at [facility], [address] [place], [place] [address]    Culture (A)  Final    >=100,000 COLONIES/mL ESCHERICHIA COLI Confirmed Extended Spectrum Beta-Lactamase Producer (ESBL).  In bloodstream infections from ESBL organisms, carbapenems are preferred over piperacillin/tazobactam. They are shown to have a lower risk of mortality.    Report Status 09/03/2020 FINAL  Final   Organism ID, Bacteria ESCHERICHIA COLI (A)  Final      Susceptibility   Escherichia coli - MIC*    AMPICILLIN >=32 RESISTANT Resistant     CEFAZOLIN >=64 RESISTANT Resistant     CEFEPIME >=32 RESISTANT Resistant     CEFTRIAXONE >=64 RESISTANT Resistant     CIPROFLOXACIN >=4 RESISTANT Resistant     GENTAMICIN 2 SENSITIVE Sensitive     IMIPENEM <=0.25 SENSITIVE Sensitive     NITROFURANTOIN <=16 SENSITIVE Sensitive     TRIMETH/SULFA >=320 RESISTANT Resistant     AMPICILLIN/SULBACTAM >=32 RESISTANT Resistant     PIP/TAZO 16 SENSITIVE Sensitive     * >=100,000 COLONIES/mL ESCHERICHIA COLI  SARS CORONAVIRUS 2 (TAT 6-24 HRS) Nasopharyngeal Nasopharyngeal Swab     Status: None   Collection Time: 09/03/20 11:10 AM   Specimen: Nasopharyngeal Swab  Result Value Ref Range Status   SARS Coronavirus 2 NEGATIVE NEGATIVE Final    Comment: (NOTE) SARS-CoV-2 target nucleic acids are NOT DETECTED.  The SARS-CoV-2 RNA is generally detectable in upper and lower respiratory specimens during the acute phase of infection. Negative results do not preclude SARS-CoV-2 infection, do not rule out co-infections with other pathogens, and should not be used as the sole basis for treatment or other patient management decisions. Negative results must be combined with clinical observations, patient history, and epidemiological information. The expected result is Negative.  Fact Sheet for Patients: HairSlick.no  Fact Sheet for Healthcare Providers: quierodirigir.com  This test is not yet  approved or cleared by the [place] FDA and  has been authorized for detection and/or diagnosis of SARS-CoV-2 by FDA under an Emergency Use Authorization (EUA). This EUA will remain  in effect (meaning this test can be used) for the duration of the COVID-19 declaration under Se ction 564(b)(1) of the Act, 21 U.S.C. section 360bbb-3(b)(1), unless the authorization is  terminated or revoked sooner.  Performed at [facility], [address] [place], [place] [address]      Time coordinating discharge: Over 30 minutes  SIGNED:   Alvira PhilipsEric J [place], DO  [facility] 09/04/2020, 9:41 AM

## 2020-09-04 NOTE — Plan of Care (Signed)
  Problem: Education: Goal: Knowledge of risk factors and measures for prevention of condition will improve Outcome: Adequate for Discharge   Problem: Coping: Goal: Psychosocial and spiritual needs will be supported Outcome: Adequate for Discharge   Problem: Respiratory: Goal: Will maintain a patent airway Outcome: Adequate for Discharge Goal: Complications related to the disease process, condition or treatment will be avoided or minimized Outcome: Adequate for Discharge   Problem: Education: Goal: Knowledge of General Education information will improve Description: Including pain rating scale, medication(s)/side effects and non-pharmacologic comfort measures Outcome: Adequate for Discharge   Problem: Health Behavior/Discharge Planning: Goal: Ability to manage health-related needs will improve Outcome: Adequate for Discharge   Problem: Clinical Measurements: Goal: Ability to maintain clinical measurements within normal limits will improve Outcome: Adequate for Discharge Goal: Will remain free from infection Outcome: Adequate for Discharge Goal: Diagnostic test results will improve Outcome: Adequate for Discharge Goal: Respiratory complications will improve Outcome: Adequate for Discharge Goal: Cardiovascular complication will be avoided Outcome: Adequate for Discharge   Problem: Activity: Goal: Risk for activity intolerance will decrease Outcome: Adequate for Discharge   Problem: Nutrition: Goal: Adequate nutrition will be maintained Outcome: Adequate for Discharge   Problem: Coping: Goal: Level of anxiety will decrease Outcome: Adequate for Discharge   Problem: Elimination: Goal: Will not experience complications related to bowel motility Outcome: Adequate for Discharge Goal: Will not experience complications related to urinary retention Outcome: Adequate for Discharge   Problem: Pain Managment: Goal: General experience of comfort will improve Outcome: Adequate  for Discharge   Problem: Safety: Goal: Ability to remain free from injury will improve Outcome: Adequate for Discharge   Problem: Skin Integrity: Goal: Risk for impaired skin integrity will decrease Outcome: Adequate for Discharge   Problem: Education: Goal: Verbalization of understanding the information provided (i.e., activity precautions, restrictions, etc) will improve Outcome: Adequate for Discharge Goal: Individualized Educational Video(s) Outcome: Adequate for Discharge   Problem: Activity: Goal: Ability to ambulate and perform ADLs will improve Outcome: Adequate for Discharge   Problem: Clinical Measurements: Goal: Postoperative complications will be avoided or minimized Outcome: Adequate for Discharge   Problem: Self-Concept: Goal: Ability to maintain and perform role responsibilities to the fullest extent possible will improve Outcome: Adequate for Discharge   Problem: Pain Management: Goal: Pain level will decrease Outcome: Adequate for Discharge

## 2020-09-04 NOTE — Care Management Important Message (Signed)
Important Message  Patient Details IM Letter given to the Patient. Name: Tiffany Mack MRN: [id number] Date of Birth: October 24, 1940   Medicare Important Message Given:  Yes     Caren Macadam 09/04/2020, 12:47 PM

## 2020-09-04 NOTE — Progress Notes (Signed)
Patient discharged via stretcher accompanied by EMS staff. Patient is alert to self, vital sign stable. Report called to SNF twice nobody answers, son aware of the discharge. All belonging are with the patient.

## 2020-09-04 NOTE — Progress Notes (Signed)
Called for report to receiving facility, no one answers. Will try again latter.

## 2020-09-05 DIAGNOSIS — K219 Gastro-esophageal reflux disease without esophagitis: Secondary | ICD-10-CM | POA: Diagnosis not present

## 2020-09-05 DIAGNOSIS — B9629 Other Escherichia coli [E. coli] as the cause of diseases classified elsewhere: Secondary | ICD-10-CM | POA: Diagnosis not present

## 2020-09-05 DIAGNOSIS — R69 Illness, unspecified: Secondary | ICD-10-CM | POA: Diagnosis not present

## 2020-09-05 DIAGNOSIS — N390 Urinary tract infection, site not specified: Secondary | ICD-10-CM | POA: Diagnosis not present

## 2020-09-05 DIAGNOSIS — I10 Essential (primary) hypertension: Secondary | ICD-10-CM | POA: Diagnosis not present

## 2020-09-05 DIAGNOSIS — E785 Hyperlipidemia, unspecified: Secondary | ICD-10-CM | POA: Diagnosis not present

## 2020-09-05 DIAGNOSIS — I2510 Atherosclerotic heart disease of native coronary artery without angina pectoris: Secondary | ICD-10-CM | POA: Diagnosis not present

## 2020-09-07 DIAGNOSIS — I10 Essential (primary) hypertension: Secondary | ICD-10-CM | POA: Diagnosis not present

## 2020-09-07 DIAGNOSIS — I2510 Atherosclerotic heart disease of native coronary artery without angina pectoris: Secondary | ICD-10-CM | POA: Diagnosis not present

## 2020-09-07 DIAGNOSIS — K219 Gastro-esophageal reflux disease without esophagitis: Secondary | ICD-10-CM | POA: Diagnosis not present

## 2020-09-07 DIAGNOSIS — N390 Urinary tract infection, site not specified: Secondary | ICD-10-CM | POA: Diagnosis not present

## 2020-09-07 DIAGNOSIS — E785 Hyperlipidemia, unspecified: Secondary | ICD-10-CM | POA: Diagnosis not present

## 2020-09-12 DIAGNOSIS — B9629 Other Escherichia coli [E. coli] as the cause of diseases classified elsewhere: Secondary | ICD-10-CM | POA: Diagnosis not present

## 2020-09-12 DIAGNOSIS — K219 Gastro-esophageal reflux disease without esophagitis: Secondary | ICD-10-CM | POA: Diagnosis not present

## 2020-09-12 DIAGNOSIS — I10 Essential (primary) hypertension: Secondary | ICD-10-CM | POA: Diagnosis not present

## 2020-09-12 DIAGNOSIS — R69 Illness, unspecified: Secondary | ICD-10-CM | POA: Diagnosis not present

## 2020-09-12 DIAGNOSIS — Z79899 Other long term (current) drug therapy: Secondary | ICD-10-CM | POA: Diagnosis not present

## 2020-09-12 DIAGNOSIS — E785 Hyperlipidemia, unspecified: Secondary | ICD-10-CM | POA: Diagnosis not present

## 2020-09-12 DIAGNOSIS — N390 Urinary tract infection, site not specified: Secondary | ICD-10-CM | POA: Diagnosis not present

## 2020-09-19 DIAGNOSIS — E785 Hyperlipidemia, unspecified: Secondary | ICD-10-CM | POA: Diagnosis not present

## 2020-09-19 DIAGNOSIS — R69 Illness, unspecified: Secondary | ICD-10-CM | POA: Diagnosis not present

## 2020-09-19 DIAGNOSIS — I10 Essential (primary) hypertension: Secondary | ICD-10-CM | POA: Diagnosis not present

## 2020-09-19 DIAGNOSIS — K219 Gastro-esophageal reflux disease without esophagitis: Secondary | ICD-10-CM | POA: Diagnosis not present

## 2020-09-20 DIAGNOSIS — Z9181 History of falling: Secondary | ICD-10-CM | POA: Diagnosis not present

## 2020-09-20 DIAGNOSIS — R69 Illness, unspecified: Secondary | ICD-10-CM | POA: Diagnosis not present

## 2020-09-20 DIAGNOSIS — E785 Hyperlipidemia, unspecified: Secondary | ICD-10-CM | POA: Diagnosis not present

## 2020-09-20 DIAGNOSIS — Z8781 Personal history of (healed) traumatic fracture: Secondary | ICD-10-CM | POA: Diagnosis not present

## 2020-09-20 DIAGNOSIS — I10 Essential (primary) hypertension: Secondary | ICD-10-CM | POA: Diagnosis not present

## 2020-09-22 DIAGNOSIS — M5136 Other intervertebral disc degeneration, lumbar region: Secondary | ICD-10-CM | POA: Diagnosis not present

## 2020-09-22 DIAGNOSIS — I2510 Atherosclerotic heart disease of native coronary artery without angina pectoris: Secondary | ICD-10-CM | POA: Diagnosis not present

## 2020-09-22 DIAGNOSIS — R69 Illness, unspecified: Secondary | ICD-10-CM | POA: Diagnosis not present

## 2020-09-22 DIAGNOSIS — M5030 Other cervical disc degeneration, unspecified cervical region: Secondary | ICD-10-CM | POA: Diagnosis not present

## 2020-09-22 DIAGNOSIS — S72001D Fracture of unspecified part of neck of right femur, subsequent encounter for closed fracture with routine healing: Secondary | ICD-10-CM | POA: Diagnosis not present

## 2020-09-22 DIAGNOSIS — E440 Moderate protein-calorie malnutrition: Secondary | ICD-10-CM | POA: Diagnosis not present

## 2020-09-22 DIAGNOSIS — S066X9D Traumatic subarachnoid hemorrhage with loss of consciousness of unspecified duration, subsequent encounter: Secondary | ICD-10-CM | POA: Diagnosis not present

## 2020-09-22 DIAGNOSIS — I10 Essential (primary) hypertension: Secondary | ICD-10-CM | POA: Diagnosis not present

## 2020-09-24 DIAGNOSIS — S72001D Fracture of unspecified part of neck of right femur, subsequent encounter for closed fracture with routine healing: Secondary | ICD-10-CM | POA: Diagnosis not present

## 2020-09-24 DIAGNOSIS — S066X9D Traumatic subarachnoid hemorrhage with loss of consciousness of unspecified duration, subsequent encounter: Secondary | ICD-10-CM | POA: Diagnosis not present

## 2020-09-24 DIAGNOSIS — E440 Moderate protein-calorie malnutrition: Secondary | ICD-10-CM | POA: Diagnosis not present

## 2020-09-24 DIAGNOSIS — M5136 Other intervertebral disc degeneration, lumbar region: Secondary | ICD-10-CM | POA: Diagnosis not present

## 2020-09-24 DIAGNOSIS — I10 Essential (primary) hypertension: Secondary | ICD-10-CM | POA: Diagnosis not present

## 2020-09-24 DIAGNOSIS — M5030 Other cervical disc degeneration, unspecified cervical region: Secondary | ICD-10-CM | POA: Diagnosis not present

## 2020-09-24 DIAGNOSIS — I2510 Atherosclerotic heart disease of native coronary artery without angina pectoris: Secondary | ICD-10-CM | POA: Diagnosis not present

## 2020-09-24 DIAGNOSIS — R69 Illness, unspecified: Secondary | ICD-10-CM | POA: Diagnosis not present

## 2020-09-25 DIAGNOSIS — I10 Essential (primary) hypertension: Secondary | ICD-10-CM | POA: Diagnosis not present

## 2020-09-25 DIAGNOSIS — E440 Moderate protein-calorie malnutrition: Secondary | ICD-10-CM | POA: Diagnosis not present

## 2020-09-25 DIAGNOSIS — M5136 Other intervertebral disc degeneration, lumbar region: Secondary | ICD-10-CM | POA: Diagnosis not present

## 2020-09-25 DIAGNOSIS — I2510 Atherosclerotic heart disease of native coronary artery without angina pectoris: Secondary | ICD-10-CM | POA: Diagnosis not present

## 2020-09-25 DIAGNOSIS — S72001D Fracture of unspecified part of neck of right femur, subsequent encounter for closed fracture with routine healing: Secondary | ICD-10-CM | POA: Diagnosis not present

## 2020-09-25 DIAGNOSIS — S066X9D Traumatic subarachnoid hemorrhage with loss of consciousness of unspecified duration, subsequent encounter: Secondary | ICD-10-CM | POA: Diagnosis not present

## 2020-09-25 DIAGNOSIS — M5030 Other cervical disc degeneration, unspecified cervical region: Secondary | ICD-10-CM | POA: Diagnosis not present

## 2020-09-25 DIAGNOSIS — R69 Illness, unspecified: Secondary | ICD-10-CM | POA: Diagnosis not present

## 2020-09-28 ENCOUNTER — Other Ambulatory Visit: Payer: Self-pay | Admitting: *Deleted

## 2020-09-28 NOTE — Patient Outreach (Signed)
[facility]) Care Management  09/28/2020  Tiffany Mack April 07, 1941 [id number]  [facility] pt discharged from rehab, [facility] back to her home facility in [place], [place].  Talked to pt son who confirmed this. Advised pt is not eligible for services at this time but provided phone number for future needs, questions or pt advocacy.  Zara Council. Burgess Estelle, MSN, [facility] Nurse Practitioner [facility] Care Management [phone]

## 2020-10-02 DIAGNOSIS — I10 Essential (primary) hypertension: Secondary | ICD-10-CM | POA: Diagnosis not present

## 2020-10-02 DIAGNOSIS — M5136 Other intervertebral disc degeneration, lumbar region: Secondary | ICD-10-CM | POA: Diagnosis not present

## 2020-10-02 DIAGNOSIS — M5030 Other cervical disc degeneration, unspecified cervical region: Secondary | ICD-10-CM | POA: Diagnosis not present

## 2020-10-02 DIAGNOSIS — E440 Moderate protein-calorie malnutrition: Secondary | ICD-10-CM | POA: Diagnosis not present

## 2020-10-02 DIAGNOSIS — S066X9D Traumatic subarachnoid hemorrhage with loss of consciousness of unspecified duration, subsequent encounter: Secondary | ICD-10-CM | POA: Diagnosis not present

## 2020-10-02 DIAGNOSIS — I2510 Atherosclerotic heart disease of native coronary artery without angina pectoris: Secondary | ICD-10-CM | POA: Diagnosis not present

## 2020-10-02 DIAGNOSIS — S72001D Fracture of unspecified part of neck of right femur, subsequent encounter for closed fracture with routine healing: Secondary | ICD-10-CM | POA: Diagnosis not present

## 2020-10-02 DIAGNOSIS — R69 Illness, unspecified: Secondary | ICD-10-CM | POA: Diagnosis not present

## 2020-10-09 DIAGNOSIS — M5030 Other cervical disc degeneration, unspecified cervical region: Secondary | ICD-10-CM | POA: Diagnosis not present

## 2020-10-09 DIAGNOSIS — M5136 Other intervertebral disc degeneration, lumbar region: Secondary | ICD-10-CM | POA: Diagnosis not present

## 2020-10-09 DIAGNOSIS — R69 Illness, unspecified: Secondary | ICD-10-CM | POA: Diagnosis not present

## 2020-10-09 DIAGNOSIS — S066X9D Traumatic subarachnoid hemorrhage with loss of consciousness of unspecified duration, subsequent encounter: Secondary | ICD-10-CM | POA: Diagnosis not present

## 2020-10-09 DIAGNOSIS — E440 Moderate protein-calorie malnutrition: Secondary | ICD-10-CM | POA: Diagnosis not present

## 2020-10-09 DIAGNOSIS — I10 Essential (primary) hypertension: Secondary | ICD-10-CM | POA: Diagnosis not present

## 2020-10-09 DIAGNOSIS — I2510 Atherosclerotic heart disease of native coronary artery without angina pectoris: Secondary | ICD-10-CM | POA: Diagnosis not present

## 2020-10-09 DIAGNOSIS — S72001D Fracture of unspecified part of neck of right femur, subsequent encounter for closed fracture with routine healing: Secondary | ICD-10-CM | POA: Diagnosis not present

## 2020-10-11 DIAGNOSIS — B353 Tinea pedis: Secondary | ICD-10-CM | POA: Diagnosis not present

## 2020-10-11 DIAGNOSIS — R69 Illness, unspecified: Secondary | ICD-10-CM | POA: Diagnosis not present

## 2020-10-11 DIAGNOSIS — F331 Major depressive disorder, recurrent, moderate: Secondary | ICD-10-CM | POA: Diagnosis not present

## 2020-10-11 DIAGNOSIS — F419 Anxiety disorder, unspecified: Secondary | ICD-10-CM | POA: Diagnosis not present

## 2020-10-13 DIAGNOSIS — Z79899 Other long term (current) drug therapy: Secondary | ICD-10-CM | POA: Diagnosis not present

## 2020-10-15 ENCOUNTER — Other Ambulatory Visit: Payer: Self-pay

## 2020-10-15 ENCOUNTER — Emergency Department (HOSPITAL_COMMUNITY)
Admission: EM | Admit: 2020-10-15 | Discharge: 2020-10-16 | Disposition: A | Discharge status: Home / Self Care | Payer: Medicare HMO | Attending: Emergency Medicine | Admitting: Emergency Medicine

## 2020-10-15 DIAGNOSIS — Z79899 Other long term (current) drug therapy: Secondary | ICD-10-CM | POA: Diagnosis not present

## 2020-10-15 DIAGNOSIS — Z743 Need for continuous supervision: Secondary | ICD-10-CM | POA: Diagnosis not present

## 2020-10-15 DIAGNOSIS — I2510 Atherosclerotic heart disease of native coronary artery without angina pectoris: Secondary | ICD-10-CM | POA: Insufficient documentation

## 2020-10-15 DIAGNOSIS — S0990XA Unspecified injury of head, initial encounter: Secondary | ICD-10-CM | POA: Diagnosis not present

## 2020-10-15 DIAGNOSIS — I10 Essential (primary) hypertension: Secondary | ICD-10-CM | POA: Insufficient documentation

## 2020-10-15 DIAGNOSIS — F0390 Unspecified dementia without behavioral disturbance: Secondary | ICD-10-CM | POA: Diagnosis not present

## 2020-10-15 DIAGNOSIS — Z8616 Personal history of COVID-19: Secondary | ICD-10-CM | POA: Diagnosis not present

## 2020-10-15 DIAGNOSIS — R079 Chest pain, unspecified: Secondary | ICD-10-CM | POA: Diagnosis not present

## 2020-10-15 DIAGNOSIS — Y92129 Unspecified place in nursing home as the place of occurrence of the external cause: Secondary | ICD-10-CM | POA: Diagnosis not present

## 2020-10-15 DIAGNOSIS — Z043 Encounter for examination and observation following other accident: Secondary | ICD-10-CM | POA: Diagnosis not present

## 2020-10-15 DIAGNOSIS — R69 Illness, unspecified: Secondary | ICD-10-CM | POA: Diagnosis not present

## 2020-10-15 DIAGNOSIS — R251 Tremor, unspecified: Secondary | ICD-10-CM | POA: Diagnosis not present

## 2020-10-15 DIAGNOSIS — W19XXXA Unspecified fall, initial encounter: Secondary | ICD-10-CM | POA: Diagnosis not present

## 2020-10-15 DIAGNOSIS — Z7982 Long term (current) use of aspirin: Secondary | ICD-10-CM | POA: Insufficient documentation

## 2020-10-15 DIAGNOSIS — R531 Weakness: Secondary | ICD-10-CM | POA: Diagnosis not present

## 2020-10-15 DIAGNOSIS — M25511 Pain in right shoulder: Secondary | ICD-10-CM | POA: Diagnosis not present

## 2020-10-16 ENCOUNTER — Emergency Department (HOSPITAL_COMMUNITY): Payer: Medicare HMO

## 2020-10-16 DIAGNOSIS — Z043 Encounter for examination and observation following other accident: Secondary | ICD-10-CM | POA: Diagnosis not present

## 2020-10-16 DIAGNOSIS — I10 Essential (primary) hypertension: Secondary | ICD-10-CM | POA: Diagnosis not present

## 2020-10-16 DIAGNOSIS — R69 Illness, unspecified: Secondary | ICD-10-CM | POA: Diagnosis not present

## 2020-10-16 DIAGNOSIS — R279 Unspecified lack of coordination: Secondary | ICD-10-CM | POA: Diagnosis not present

## 2020-10-16 DIAGNOSIS — Z743 Need for continuous supervision: Secondary | ICD-10-CM | POA: Diagnosis not present

## 2020-10-16 DIAGNOSIS — R079 Chest pain, unspecified: Secondary | ICD-10-CM | POA: Diagnosis not present

## 2020-10-16 DIAGNOSIS — M25511 Pain in right shoulder: Secondary | ICD-10-CM | POA: Diagnosis not present

## 2020-10-16 LAB — CBC WITH DIFFERENTIAL/PLATELET
Abs Immature Granulocytes: 0.04 10*3/uL (ref 0.00–0.07)
Basophils Absolute: 0.1 10*3/uL (ref 0.0–0.1)
Basophils Relative: 0 %
Eosinophils Absolute: 0.2 10*3/uL (ref 0.0–0.5)
Eosinophils Relative: 2 %
HCT: 46.7 % — ABNORMAL HIGH (ref 36.0–46.0)
Hemoglobin: 15 g/dL (ref 12.0–15.0)
Immature Granulocytes: 0 %
Lymphocytes Relative: 21 %
Lymphs Abs: 2.3 10*3/uL (ref 0.7–4.0)
MCH: 29.2 pg (ref 26.0–34.0)
MCHC: 32.1 g/dL (ref 30.0–36.0)
MCV: 91 fL (ref 80.0–100.0)
Monocytes Absolute: 0.4 10*3/uL (ref 0.1–1.0)
Monocytes Relative: 4 %
Neutro Abs: 8.1 10*3/uL — ABNORMAL HIGH (ref 1.7–7.7)
Neutrophils Relative %: 73 %
Platelets: 265 10*3/uL (ref 150–400)
RBC: 5.13 MIL/uL — ABNORMAL HIGH (ref 3.87–5.11)
RDW: 12.5 % (ref 11.5–15.5)
WBC: 11.1 10*3/uL — ABNORMAL HIGH (ref 4.0–10.5)
nRBC: 0 % (ref 0.0–0.2)

## 2020-10-16 LAB — COMPREHENSIVE METABOLIC PANEL
ALT: 20 U/L (ref 0–44)
AST: 24 U/L (ref 15–41)
Albumin: 4.1 g/dL (ref 3.5–5.0)
Alkaline Phosphatase: 139 U/L — ABNORMAL HIGH (ref 38–126)
Anion gap: 12 (ref 5–15)
BUN: 15 mg/dL (ref 8–23)
CO2: 27 mmol/L (ref 22–32)
Calcium: 9.6 mg/dL (ref 8.9–10.3)
Chloride: 101 mmol/L (ref 98–111)
Creatinine, Ser: 0.65 mg/dL (ref 0.44–1.00)
GFR, Estimated: >60 mL/min (ref >60)
Glucose, Bld: 102 mg/dL — ABNORMAL HIGH (ref 70–99)
Potassium: 3.3 mmol/L — ABNORMAL LOW (ref 3.5–5.1)
Sodium: 140 mmol/L (ref 135–145)
Total Bilirubin: 1.2 mg/dL (ref 0.3–1.2)
Total Protein: 7.8 g/dL (ref 6.5–8.1)

## 2020-10-16 NOTE — ED Triage Notes (Signed)
Pt had unwitnessed fall.  She was found by staff during rounds. Unknown if she hit her had. Dementia at baseline. Facility denies she is on blood thinners.

## 2020-10-16 NOTE — ED Notes (Signed)
Patient denies pain and is resting comfortably.  

## 2020-10-16 NOTE — ED Notes (Signed)
Report called to [employer] (med tech) at [employer].

## 2020-10-16 NOTE — ED Notes (Signed)
X-ray at bedside

## 2020-10-16 NOTE — ED Notes (Signed)
Patient is resting comfortably. 

## 2020-10-16 NOTE — Discharge Instructions (Addendum)
Testing is negative for serious injury from fall.  Keep yourself hydrated.  Follow-up with your doctor.  Return to the ED with new or worsening symptoms.

## 2020-10-16 NOTE — ED Notes (Signed)
Pt transported to CT. Will obtain EKG when she returns.

## 2020-10-16 NOTE — ED Notes (Signed)
PTAR request called

## 2020-10-16 NOTE — ED Provider Notes (Signed)
[facility]-EMERGENCY DEPT Provider Note   CSN: [id number] Arrival date & time: 10/15/20  2349     History Chief Complaint  Patient presents with  . Fall    Tiffany Mack is a 80 y.o. female.  Level 5 caveat for dementia.  Patient brought in from her facility after unwitnessed fall.  She was found lying on the ground in a supine position for unknown period of time.  It is assumed that she fell.  She does not remember falling.  She is oriented to person and place.  She believes it is February.  She denies any pain.  EMS noted her head to have some tremors in her upper lower extremities which the facility was not aware of.  No fever, chills, nausea or vomiting.  No chest pain or shortness of breath. Patient is not on anticoagulation.  She had a subarachnoid hemorrhage in January as well as a right hip fracture. She states she walks with no assistance.  She states she is not hurting anywhere and does not know what happened.  The history is provided by the patient and the EMS personnel. The history is limited by the condition of the patient.  Fall       Past Medical History:  Diagnosis Date  . Anxiety   . Coronary artery disease   . COVID-19 07/26/2019  . Dementia [facility](HCC)     Patient Active Problem List   Diagnosis Date Noted  . Pneumonia due to COVID-19 virus 07/28/2019  . Acute cystitis without hematuria   . Acute encephalopathy 07/26/2019  . Pressure injury of skin 07/26/2019  . CAP (community acquired pneumonia) 07/26/2019  . Hypokalemia 07/26/2019  . Acute lower UTI 07/26/2019  . Depression 07/26/2019  . Malnutrition of moderate degree 03/24/2018  . 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 Vomiting 03/21/2018  . HTN (hypertension) 03/21/2018  . Dementia (HCC) 03/21/2018  . CAD (coronary artery disease) 03/21/2018  . Acute non Q wave MI (myocardial infarction), initial episode of care [facility](HCC) 08/04/2015    Past Surgical History:  Procedure  Laterality Date  . CARDIAC CATHETERIZATION N/A 08/04/2015   Procedure: Left Heart Cath and Coronary Angiography;  Surgeon: Rinaldo CloudMohan Harwani, MD;  [facility];  Service: Cardiovascular;  Laterality: N/A;  . CARDIAC CATHETERIZATION N/A 08/08/2015   Procedure: Coronary Stent Intervention;  Surgeon: Rinaldo CloudMohan Harwani, MD;  Location: [facility] INVASIVE CV LAB;  Service: Cardiovascular;  Laterality: N/A;  . HIP PINNING,CANNULATED Right 08/29/2020   Procedure: CANNULATED HIP PINNING;  Surgeon: Teryl LucyLandau, Joshua, MD;  Location: [facility] ORS;  Service: Orthopedics;  Laterality: Right;     OB History   No obstetric history on file.     Family History  Problem Relation Age of Onset  . Heart disease Mother     Social History   Tobacco Use  . Smoking status: Never Smoker  . Smokeless tobacco: Never Used  Vaping Use  . Vaping Use: Never used  Substance Use Topics  . Alcohol use: No  . Drug use: No    Home Medications Prior to Admission medications   Medication Sig Start Date End Date Taking? Authorizing Provider  acetaminophen (TYLENOL) 325 MG tablet Take 1 tablet (325 mg total) by mouth every 6 (six) hours as needed for mild pain or moderate pain. 09/01/20   Armida SansBrown, Blaine K, PA-C  aspirin EC 81 MG EC tablet Take 1 tablet (81 mg total) by mouth daily. 08/11/15   [name], Mohan, MD  atorvastatin (LIPITOR) 80  MG tablet Take 1 tablet (80 mg total) by mouth daily at 6 PM. 08/11/15   Rinaldo Cloud, MD  enoxaparin (LOVENOX) 40 MG/0.4ML injection Inject 0.4 mLs (40 mg total) into the skin daily. 08/31/20   Janine Ores K, PA-C  LORazepam (ATIVAN) 0.5 MG tablet Take 1 tablet (0.5 mg total) by mouth 3 (three) times daily as needed (agitation). 09/04/20   [place], Eric J, DO  memantine (NAMENDA) 10 MG tablet Take 1 tablet every night for 2 weeks, then increase to 1 tablet twice a day Patient taking differently: Take 10 mg by mouth at bedtime. 05/26/19   Van Clines, MD  metoprolol tartrate (LOPRESSOR) 25 MG  tablet Take 0.5 tablets (12.5 mg total) by mouth 2 (two) times daily. 08/11/15   Rinaldo Cloud, MD  mirtazapine (REMERON) 30 MG tablet Take 30 mg by mouth at bedtime.  10/08/18   [provider]  multivitamin-iron-minerals-folic acid (THERAPEUTIC-M) TABS tablet Take 1 tablet by mouth daily.    [provider]  nitroGLYCERIN (NITROSTAT) 0.4 MG SL tablet Place 1 tablet (0.4 mg total) under the tongue every 5 (five) minutes as needed for chest pain (CP or SOB). 08/11/15   Rinaldo Cloud, MD  ondansetron (ZOFRAN) 8 MG tablet Take 8 mg by mouth every 8 (eight) hours as needed for nausea/vomiting. 03/17/18   [provider]  pantoprazole (PROTONIX) 40 MG tablet Take 1 tablet (40 mg total) by mouth daily at 6 (six) AM. 08/11/15   Rinaldo Cloud, MD  rivastigmine (EXELON) 13.3 MG/24HR Place 13.3 mg onto the skin daily.  06/22/19   [provider]  Skin Protectants, Misc. (DIMETHICONE-ZINC OXIDE) cream Apply 1 application topically in the morning, at noon, and at bedtime.    [provider]  Sunscreens (COPPERTONE SPORT SPF 30 EX) Apply 1 application topically every 2 (two) hours as needed. While in the sun    [provider]  traMADol (ULTRAM) 50 MG tablet Take 1 tablet (50 mg total) by mouth every 12 (twelve) hours as needed for severe pain. 09/01/20 09/01/21  Janine Ores K, PA-C  venlafaxine XR (EFFEXOR-XR) 37.5 MG 24 hr capsule Take 37.5 mg by mouth daily with breakfast.    [provider]  ZINC OXIDE, TOPICAL, (SECURA PROTECTIVE) 10 % CREA Apply 1 application topically 2 (two) times daily as needed (burn & irritation).    [provider]    Allergies    Patient has no known allergies.  Review of Systems   Review of Systems  Unable to perform ROS: Dementia    Physical Exam Updated Vital Signs BP (!) 166/59 (BP Location: Right Arm)   Pulse 66   Temp 98 F (36.7 C) (Oral)   Resp 16   SpO2 100%   Physical Exam Vitals and  nursing note reviewed.  Constitutional:      General: She is not in acute distress.    Appearance: She is well-developed.  HENT:     Head: Normocephalic and atraumatic.     Mouth/Throat:     Pharynx: No oropharyngeal exudate.  Eyes:     Conjunctiva/sclera: Conjunctivae normal.     Pupils: Pupils are equal, round, and reactive to light.  Neck:     Comments: C spine nontender Cardiovascular:     Rate and Rhythm: Normal rate and regular rhythm.     Heart sounds: Normal heart sounds. No murmur heard.   Pulmonary:     Effort: Pulmonary effort is normal. No respiratory distress.  Breath sounds: Normal breath sounds.  Abdominal:     Palpations: Abdomen is soft.     Tenderness: There is no abdominal tenderness. There is no guarding or rebound.  Musculoskeletal:        General: Deformity present. No tenderness. Normal range of motion.     Comments: No T or L Spine tenderness. FROM hips without pain.  Pelvis stable.  Questionable right clavicle deformity.  It is nontender.  Full range of motion of right shoulder without difficulty  Skin:    General: Skin is warm.  Neurological:     Mental Status: She is alert and oriented to person, place, and time.     Cranial Nerves: No cranial nerve deficit.     Motor: No abnormal muscle tone.     Coordination: Coordination normal.     Comments: No ataxia on finger to nose bilaterally. No pronator drift. 5/5 strength throughout. CN 2-12 intact.Equal grip strength. Sensation intact.   Psychiatric:        Behavior: Behavior normal.     ED Results / Procedures / Treatments   Labs (all labs ordered are listed, but only abnormal results are displayed) Labs Reviewed  CBC WITH DIFFERENTIAL/PLATELET - Abnormal; Notable for the following components:      Result Value   WBC 11.1 (*)    RBC 5.13 (*)    HCT 46.7 (*)    Neutro Abs 8.1 (*)    All other components within normal limits  COMPREHENSIVE METABOLIC PANEL - Abnormal; Notable for the  following components:   Potassium 3.3 (*)    Glucose, Bld 102 (*)    Alkaline Phosphatase 139 (*)    All other components within normal limits  URINALYSIS, ROUTINE W REFLEX MICROSCOPIC    EKG EKG Interpretation  Date/Time:  Monday October 16 2020 01:12:44 EDT Ventricular Rate:  66 PR Interval:    QRS Duration: 93 QT Interval:  424 QTC Calculation: 445 R Axis:   13 Text Interpretation: Sinus rhythm Probable anteroseptal infarct, old No significant change was found Confirmed by Glynn Octave [phone]) on 10/16/2020 1:16:43 AM   Radiology DG Pelvis 1-2 Views  Result Date: 10/16/2020 CLINICAL DATA:  Unwitnessed fall EXAM: PELVIS - 1-2 VIEW COMPARISON:  None. FINDINGS: There is no evidence of pelvic fracture or diastasis. The patient has had a prior screw fixation across the right proximal femur. 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 IMPRESSION: No definite acute osseous abnormality. Electronically Signed   By: Jonna Clark M.D.   On: 10/16/2020 00:49   DG Shoulder Right  Result Date: 10/16/2020 CLINICAL DATA:  Shoulder pain EXAM: RIGHT SHOULDER - 2+ VIEW COMPARISON:  None. FINDINGS: There is no evidence of fracture or dislocation. Again noted is prior resection of the mid clavicle. Moderate glenohumeral joint osteoarthritis seen with joint space loss. Soft tissues are unremarkable. IMPRESSION: No definite acute osseous abnormality. Electronically Signed   By: Jonna Clark M.D.   On: 10/16/2020 00:47   CT Head Wo Contrast  Result Date: 10/16/2020 CLINICAL DATA:  Unwitnessed fall EXAM: CT HEAD WITHOUT CONTRAST TECHNIQUE: Contiguous axial images were obtained from the base of the skull through the vertex without intravenous contrast. COMPARISON:  None. FINDINGS: Brain: No evidence of acute territorial infarction, hemorrhage, hydrocephalus,extra-axial collection or mass lesion/mass effect. There is dilatation the  ventricles and sulci consistent with age-related atrophy. Low-attenuation changes in the deep white matter consistent with small vessel ischemia. Vascular: No hyperdense  vessel or unexpected calcification. Skull: The skull is intact. No fracture or focal lesion identified. Sinuses/Orbits: The visualized paranasal sinuses and mastoid air cells are clear. The orbits and globes intact. Other: None Cervical spine: Alignment: Physiologic Skull base and vertebrae: Visualized skull base is intact. No atlanto-occipital dissociation. The vertebral body heights are well maintained. No fracture or pathologic osseous lesion seen. Soft tissues and spinal canal: The visualized paraspinal soft tissues are unremarkable. No prevertebral soft tissue swelling is seen. The spinal canal is grossly unremarkable, no large epidural collection or significant canal narrowing. Disc levels: Multilevel cervical spine spondylosis is seen with large anterior osteophytes disc osteophyte complex and uncovertebral osteophytes most notable at C5-C6 with moderate neural foraminal narrowing and mild central canal stenosis. Upper chest: Biapical scarring is present. Thoracic inlet is within normal limits. Other: None IMPRESSION: No acute intracranial abnormality. Findings consistent with age related atrophy and chronic small vessel ischemia No acute fracture or malalignment of the spine. Electronically Signed   By: Jonna Clark M.D.   On: 10/16/2020 01:06   CT Cervical Spine Wo Contrast  Result Date: 10/16/2020 CLINICAL DATA:  Unwitnessed fall EXAM: CT HEAD WITHOUT CONTRAST TECHNIQUE: Contiguous axial images were obtained from the base of the skull through the vertex without intravenous contrast. COMPARISON:  None. FINDINGS: Brain: No evidence of acute territorial infarction, hemorrhage, hydrocephalus,extra-axial collection or mass lesion/mass effect. There is dilatation the ventricles and sulci consistent with age-related atrophy. Low-attenuation  changes in the deep white matter consistent with small vessel ischemia. Vascular: No hyperdense vessel or unexpected calcification. Skull: The skull is intact. No fracture or focal lesion identified. Sinuses/Orbits: The visualized paranasal sinuses and mastoid air cells are clear. The orbits and globes intact. Other: None Cervical spine: Alignment: Physiologic Skull base and vertebrae: Visualized skull base is intact. No atlanto-occipital dissociation. The vertebral body heights are well maintained. No fracture or pathologic osseous lesion seen. Soft tissues and spinal canal: The visualized paraspinal soft tissues are unremarkable. No prevertebral soft tissue swelling is seen. The spinal canal is grossly unremarkable, no large epidural collection or significant canal narrowing. Disc levels: Multilevel cervical spine spondylosis is seen with large anterior osteophytes disc osteophyte complex and uncovertebral osteophytes most notable at C5-C6 with moderate neural foraminal narrowing and mild central canal stenosis. Upper chest: Biapical scarring is present. Thoracic inlet is within normal limits. Other: None IMPRESSION: No acute intracranial abnormality. Findings consistent with age related atrophy and chronic small vessel ischemia No acute fracture or malalignment of the spine. Electronically Signed   By: Jonna Clark M.D.   On: 10/16/2020 01:06   DG Chest Portable 1 View  Result Date: 10/16/2020 CLINICAL DATA:  Fall pain EXAM: PORTABLE CHEST 1 VIEW COMPARISON:  None. FINDINGS: The heart size and mediastinal contours are within normal limits. Coronary artery stents are seen. Both lungs are clear. The visualized skeletal structures are unremarkable. IMPRESSION: No active disease. Electronically Signed   By: Jonna Clark M.D.   On: 10/16/2020 00:55    Procedures Procedures   Medications Ordered in ED Medications - No data to display  ED Course  I have reviewed the triage vital signs and the nursing  notes.  Pertinent labs & imaging results that were available during my care of the patient were reviewed by me and considered in my medical decision making (see chart for details).    MDM Rules/Calculators/A&P  Found on ground. Assumed fall. Denies pain. Vitals stable. ABCs intact.   CT head and C-spine are negative.  X-ray shows apparent chronic deformity of right clavicle.  Labs are reassuring.  Patient able to tolerate p.o. and ambulate.  Denies any complaints.  Unable to give a urine sample but no report of fever or dysuria or UTI symptoms.  Appears back to baseline and stable to return to her facility  Final Clinical Impression(s) / ED Diagnoses Final diagnoses:  Fall, initial encounter    Rx / DC Orders ED Discharge Orders    None       Lizvette Lightsey, Jeannett Senior, MD 10/16/20 [phone]

## 2020-10-16 NOTE — ED Notes (Signed)
Pt tolerated PO fluids well. Pt ambulated with walker and supervised approximately 20 feet with steady gait. No complaints of dizziness.

## 2020-10-16 NOTE — ED Notes (Signed)
Patient resting comfortably

## 2020-10-22 ENCOUNTER — Encounter (HOSPITAL_COMMUNITY): Payer: Self-pay | Admitting: Emergency Medicine

## 2020-10-22 ENCOUNTER — Emergency Department (HOSPITAL_COMMUNITY): Payer: Medicare HMO

## 2020-10-22 ENCOUNTER — Emergency Department (HOSPITAL_COMMUNITY)
Admission: EM | Admit: 2020-10-22 | Discharge: 2020-10-22 | Disposition: A | Discharge status: Home / Self Care | Payer: Medicare HMO | Attending: Emergency Medicine | Admitting: Emergency Medicine

## 2020-10-22 DIAGNOSIS — S199XXA Unspecified injury of neck, initial encounter: Secondary | ICD-10-CM | POA: Diagnosis not present

## 2020-10-22 DIAGNOSIS — F0390 Unspecified dementia without behavioral disturbance: Secondary | ICD-10-CM | POA: Diagnosis not present

## 2020-10-22 DIAGNOSIS — I10 Essential (primary) hypertension: Secondary | ICD-10-CM | POA: Insufficient documentation

## 2020-10-22 DIAGNOSIS — M25512 Pain in left shoulder: Secondary | ICD-10-CM | POA: Insufficient documentation

## 2020-10-22 DIAGNOSIS — Z8616 Personal history of COVID-19: Secondary | ICD-10-CM | POA: Insufficient documentation

## 2020-10-22 DIAGNOSIS — I2510 Atherosclerotic heart disease of native coronary artery without angina pectoris: Secondary | ICD-10-CM | POA: Diagnosis not present

## 2020-10-22 DIAGNOSIS — Z7982 Long term (current) use of aspirin: Secondary | ICD-10-CM | POA: Diagnosis not present

## 2020-10-22 DIAGNOSIS — R609 Edema, unspecified: Secondary | ICD-10-CM | POA: Diagnosis not present

## 2020-10-22 DIAGNOSIS — S0990XA Unspecified injury of head, initial encounter: Secondary | ICD-10-CM | POA: Diagnosis not present

## 2020-10-22 DIAGNOSIS — Z79899 Other long term (current) drug therapy: Secondary | ICD-10-CM | POA: Diagnosis not present

## 2020-10-22 DIAGNOSIS — R9082 White matter disease, unspecified: Secondary | ICD-10-CM | POA: Diagnosis not present

## 2020-10-22 DIAGNOSIS — Z7901 Long term (current) use of anticoagulants: Secondary | ICD-10-CM | POA: Diagnosis not present

## 2020-10-22 DIAGNOSIS — W19XXXA Unspecified fall, initial encounter: Secondary | ICD-10-CM

## 2020-10-22 DIAGNOSIS — M19012 Primary osteoarthritis, left shoulder: Secondary | ICD-10-CM | POA: Diagnosis not present

## 2020-10-22 DIAGNOSIS — M25561 Pain in right knee: Secondary | ICD-10-CM | POA: Diagnosis not present

## 2020-10-22 DIAGNOSIS — R69 Illness, unspecified: Secondary | ICD-10-CM | POA: Diagnosis not present

## 2020-10-22 DIAGNOSIS — M2578 Osteophyte, vertebrae: Secondary | ICD-10-CM | POA: Diagnosis not present

## 2020-10-22 DIAGNOSIS — E876 Hypokalemia: Secondary | ICD-10-CM | POA: Insufficient documentation

## 2020-10-22 DIAGNOSIS — W01198A Fall on same level from slipping, tripping and stumbling with subsequent striking against other object, initial encounter: Secondary | ICD-10-CM | POA: Insufficient documentation

## 2020-10-22 DIAGNOSIS — M25551 Pain in right hip: Secondary | ICD-10-CM | POA: Diagnosis not present

## 2020-10-22 DIAGNOSIS — S80211A Abrasion, right knee, initial encounter: Secondary | ICD-10-CM | POA: Diagnosis not present

## 2020-10-22 DIAGNOSIS — S8991XA Unspecified injury of right lower leg, initial encounter: Secondary | ICD-10-CM | POA: Diagnosis present

## 2020-10-22 LAB — COMPREHENSIVE METABOLIC PANEL
ALT: 15 U/L (ref 0–44)
AST: 16 U/L (ref 15–41)
Albumin: 3.2 g/dL — ABNORMAL LOW (ref 3.5–5.0)
Alkaline Phosphatase: 97 U/L (ref 38–126)
Anion gap: 8 (ref 5–15)
BUN: 12 mg/dL (ref 8–23)
CO2: 29 mmol/L (ref 22–32)
Calcium: 8.9 mg/dL (ref 8.9–10.3)
Chloride: 104 mmol/L (ref 98–111)
Creatinine, Ser: 0.62 mg/dL (ref 0.44–1.00)
GFR, Estimated: >60 mL/min (ref >60)
Glucose, Bld: 105 mg/dL — ABNORMAL HIGH (ref 70–99)
Potassium: 2.9 mmol/L — ABNORMAL LOW (ref 3.5–5.1)
Sodium: 141 mmol/L (ref 135–145)
Total Bilirubin: 0.9 mg/dL (ref 0.3–1.2)
Total Protein: 6.3 g/dL — ABNORMAL LOW (ref 6.5–8.1)

## 2020-10-22 LAB — CBC WITH DIFFERENTIAL/PLATELET
Abs Immature Granulocytes: 0.03 10*3/uL (ref 0.00–0.07)
Basophils Absolute: 0 10*3/uL (ref 0.0–0.1)
Basophils Relative: 0 %
Eosinophils Absolute: 0.1 10*3/uL (ref 0.0–0.5)
Eosinophils Relative: 1 %
HCT: 40.4 % (ref 36.0–46.0)
Hemoglobin: 13 g/dL (ref 12.0–15.0)
Immature Granulocytes: 0 %
Lymphocytes Relative: 25 %
Lymphs Abs: 2.4 10*3/uL (ref 0.7–4.0)
MCH: 28.8 pg (ref 26.0–34.0)
MCHC: 32.2 g/dL (ref 30.0–36.0)
MCV: 89.6 fL (ref 80.0–100.0)
Monocytes Absolute: 0.7 10*3/uL (ref 0.1–1.0)
Monocytes Relative: 7 %
Neutro Abs: 6.4 10*3/uL (ref 1.7–7.7)
Neutrophils Relative %: 67 %
Platelets: 224 10*3/uL (ref 150–400)
RBC: 4.51 MIL/uL (ref 3.87–5.11)
RDW: 12.5 % (ref 11.5–15.5)
WBC: 9.7 10*3/uL (ref 4.0–10.5)
nRBC: 0 % (ref 0.0–0.2)

## 2020-10-22 LAB — MAGNESIUM: Magnesium: 1.9 mg/dL (ref 1.7–2.4)

## 2020-10-22 MED ORDER — ACETAMINOPHEN 500 MG PO TABS
1000.0000 mg | ORAL_TABLET | Freq: Once | ORAL | Status: AC
  Administered 2020-10-22: 1000 mg via ORAL
  Filled 2020-10-22: qty 2

## 2020-10-22 MED ORDER — POTASSIUM CHLORIDE CRYS ER 20 MEQ PO TBCR
40.0000 meq | EXTENDED_RELEASE_TABLET | Freq: Once | ORAL | Status: AC
  Administered 2020-10-22: 40 meq via ORAL
  Filled 2020-10-22: qty 2

## 2020-10-22 MED ORDER — POTASSIUM CHLORIDE CRYS ER 20 MEQ PO TBCR: 40.0000 meq | EXTENDED_RELEASE_TABLET | Freq: Two times a day (BID) | ORAL | 0 refills | Status: DC

## 2020-10-22 MED ORDER — MAG-OXIDE 200 MG PO TABS: 200.0000 mg | ORAL_TABLET | Freq: Every day | ORAL | 0 refills | Status: AC

## 2020-10-22 NOTE — ED Provider Notes (Signed)
[facility]-EMERGENCY DEPT Provider Note   CSN: [id number] Arrival date & time: 10/22/20  1337  LEVEL 5 CAVEAT - DEMENTIA  History Chief Complaint  Patient presents with  . Fall    Tiffany Mack is a 80 y.o. female.  HPI 80 year old female presents after a fall.  History is mostly from the son given the patient's dementia.  Patient was being taken out for a lunch and leaned the wrong way and fell while trying to go to the bathroom.  She has a history of frequent falls.  She did not lose consciousness but did hit her head.  She also skinned her knee and has been having some knee pain and might of hit her hip that was just repaired a month or 2 ago.  The son recently got back from traveling and noticed that her left shoulder has been painful and she has decreased range of motion.  She was here for a fall about a week ago but it is unclear if that is when she injured it. Patient was able to ambulate after the fall.    Past Medical History:  Diagnosis Date  . Anxiety   . Coronary artery disease   . COVID-19 07/26/2019  . Dementia [facility])     Patient Active Problem List   Diagnosis Date Noted  . Pneumonia due to COVID-19 virus 07/28/2019  . Acute cystitis without hematuria   . Acute encephalopathy 07/26/2019  . Pressure injury of skin 07/26/2019  . CAP (community acquired pneumonia) 07/26/2019  . Hypokalemia 07/26/2019  . Acute lower UTI 07/26/2019  . Depression 07/26/2019  . Malnutrition of moderate degree 03/24/2018  . 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 Vomiting 03/21/2018  . HTN (hypertension) 03/21/2018  . Dementia (HCC) 03/21/2018  . CAD (coronary artery disease) 03/21/2018  . Acute non Q wave MI (myocardial infarction), initial episode of care [facility]) 08/04/2015    Past Surgical History:  Procedure Laterality Date  . CARDIAC CATHETERIZATION N/A 08/04/2015   Procedure: Left Heart Cath and Coronary Angiography;  Surgeon: Rinaldo Cloud, MD;   Location: [facility] INVASIVE CV LAB;  Service: Cardiovascular;  Laterality: N/A;  . CARDIAC CATHETERIZATION N/A 08/08/2015   Procedure: Coronary Stent Intervention;  Surgeon: Rinaldo Cloud, MD;  Location: [facility] INVASIVE CV LAB;  Service: Cardiovascular;  Laterality: N/A;  . HIP PINNING,CANNULATED Right 08/29/2020   Procedure: CANNULATED HIP PINNING;  Surgeon: Teryl Lucy, MD;  Location: [facility] ORS;  Service: Orthopedics;  Laterality: Right;     OB History   No obstetric history on file.     Family History  Problem Relation Age of Onset  . Heart disease Mother     Social History   Tobacco Use  . Smoking status: Never Smoker  . Smokeless tobacco: Never Used  Vaping Use  . Vaping Use: Never used  Substance Use Topics  . Alcohol use: No  . Drug use: No    Home Medications Prior to Admission medications   Medication Sig Start Date End Date Taking? Authorizing Provider  aspirin EC 81 MG EC tablet Take 1 tablet (81 mg total) by mouth daily. 08/11/15  Yes Rinaldo Cloud, MD  atorvastatin (LIPITOR) 80 MG tablet Take 1 tablet (80 mg total) by mouth daily at 6 PM. 08/11/15  Yes Rinaldo Cloud, MD  Cholecalciferol (VITAMIN D) 50 MCG (2000 UT) tablet Take 2,000 Units by mouth daily.   Yes [provider]  enoxaparin (LOVENOX) 40 MG/0.4ML injection Inject 0.4 mLs (40 mg total) into the skin  daily. Patient taking differently: Inject 40 mg into the skin every 30 (thirty) days. 08/31/20  Yes Janine Ores K, PA-C  Magnesium Oxide (MAG-OXIDE) 200 MG TABS Take 1 tablet (200 mg total) by mouth daily for 5 days. 10/22/20 10/27/20 Yes Pricilla Loveless, MD  memantine (NAMENDA) 10 MG tablet Take 1 tablet every night for 2 weeks, then increase to 1 tablet twice a day Patient taking differently: Take 10 mg by mouth 2 (two) times daily. 05/26/19  Yes Van Clines, MD  metoprolol tartrate (LOPRESSOR) 25 MG tablet Take 0.5 tablets (12.5 mg total) by mouth 2 (two) times daily. 08/11/15  Yes Rinaldo Cloud, MD   miconazole (MICOTIN) 2 % powder Apply 1 application topically 2 (two) times daily.   Yes [provider]  mirtazapine (REMERON) 30 MG tablet Take 30 mg by mouth at bedtime.  10/08/18  Yes [provider]  pantoprazole (PROTONIX) 40 MG tablet Take 1 tablet (40 mg total) by mouth daily at 6 (six) AM. 08/11/15  Yes Rinaldo Cloud, MD  potassium chloride SA (KLOR-CON) 20 MEQ tablet Take 2 tablets (40 mEq total) by mouth 2 (two) times daily for 5 days. 10/22/20 10/27/20 Yes Pricilla Loveless, MD  rivastigmine (EXELON) 13.3 MG/24HR Place 13.3 mg onto the skin daily.  06/22/19  Yes [provider]  traMADol (ULTRAM) 50 MG tablet Take 50 mg by mouth every 12 (twelve) hours as needed for moderate pain.   Yes [provider]  venlafaxine XR (EFFEXOR-XR) 37.5 MG 24 hr capsule Take 37.5 mg by mouth daily with breakfast.   Yes [provider]  acetaminophen (TYLENOL) 325 MG tablet Take 1 tablet (325 mg total) by mouth every 6 (six) hours as needed for mild pain or moderate pain. Patient not taking: No sig reported 09/01/20   Janine Ores K, PA-C  LORazepam (ATIVAN) 0.5 MG tablet Take 1 tablet (0.5 mg total) by mouth 3 (three) times daily as needed (agitation). Patient not taking: No sig reported 09/04/20   [place], Alvira Philips, DO  nitroGLYCERIN (NITROSTAT) 0.4 MG SL tablet Place 1 tablet (0.4 mg total) under the tongue every 5 (five) minutes as needed for chest pain (CP or SOB). Patient not taking: Reported on 10/22/2020 08/11/15   Rinaldo Cloud, MD  traMADol (ULTRAM) 50 MG tablet Take 1 tablet (50 mg total) by mouth every 12 (twelve) hours as needed for severe pain. Patient not taking: No sig reported 09/01/20 09/01/21  Armida Sans, PA-C    Allergies    Patient has no known allergies.  Review of Systems   Review of Systems  Unable to perform ROS: Dementia    Physical Exam Updated Vital Signs BP 104/60   Pulse 79   Temp 98.9 F (37.2 C) (Oral)   Resp 18   SpO2 96%    Physical Exam Vitals and nursing note reviewed.  Constitutional:      Appearance: She is well-developed.  HENT:     Head: Normocephalic and atraumatic.     Right Ear: External ear normal.     Left Ear: External ear normal.     Nose: Nose normal.  Eyes:     General:        Right eye: No discharge.        Left eye: No discharge.     Pupils: Pupils are equal, round, and reactive to light.  Cardiovascular:     Rate and Rhythm: Normal rate and regular rhythm.     Pulses:  Radial pulses are 2+ on the left side.       Dorsalis pedis pulses are 2+ on the right side.     Heart sounds: Normal heart sounds.  Pulmonary:     Effort: Pulmonary effort is normal.     Breath sounds: Normal breath sounds.  Abdominal:     Palpations: Abdomen is soft.     Tenderness: There is no abdominal tenderness.  Musculoskeletal:     Left shoulder: Swelling and tenderness (mild) present. No deformity. Decreased range of motion.     Left upper arm: No tenderness.     Left elbow: Normal range of motion. No tenderness.     Left forearm: No tenderness.     Left wrist: No tenderness.     Left hand: No tenderness.     Cervical back: No tenderness.     Thoracic back: No tenderness.     Lumbar back: No tenderness.     Right hip: No tenderness or bony tenderness. Normal range of motion.     Right upper leg: No tenderness.     Right knee: No swelling or deformity. Normal range of motion. Tenderness (mild) present.     Right lower leg: No tenderness.       Legs:  Skin:    General: Skin is warm and dry.  Neurological:     Mental Status: She is alert.  Psychiatric:        Mood and Affect: Mood is not anxious.     ED Results / Procedures / Treatments   Labs (all labs ordered are listed, but only abnormal results are displayed) Labs Reviewed  COMPREHENSIVE METABOLIC PANEL - Abnormal; Notable for the following components:      Result Value   Potassium 2.9 (*)    Glucose, Bld 105 (*)    Total  Protein 6.3 (*)    Albumin 3.2 (*)    All other components within normal limits  CBC WITH DIFFERENTIAL/PLATELET  MAGNESIUM  URINALYSIS, ROUTINE W REFLEX MICROSCOPIC    EKG EKG Interpretation  Date/Time:  Sunday October 22 2020 14:27:40 EDT Ventricular Rate:  94 PR Interval:    QRS Duration: 84 QT Interval:  334 QTC Calculation: 418 R Axis:   55 Text Interpretation: Sinus rhythm Low voltage, precordial leads Anteroseptal infarct, old Borderline T abnormalities, inferior leads T wave changes similar to 2020 Confirmed by Pricilla Loveless [phone]) on 10/22/2020 3:35:02 PM   Radiology CT Head Wo Contrast  Result Date: 10/22/2020 CLINICAL DATA:  Head trauma EXAM: CT HEAD WITHOUT CONTRAST CT CERVICAL SPINE WITHOUT CONTRAST TECHNIQUE: Multidetector CT imaging of the head and cervical spine was performed following the standard protocol without intravenous contrast. Multiplanar CT image reconstructions of the cervical spine were also generated. COMPARISON:  10/16/2020 FINDINGS: CT HEAD FINDINGS Brain: No evidence of acute infarction, hemorrhage, hydrocephalus, extra-axial collection or mass lesion/mass effect. Periventricular and deep white matter hypodensity. Vascular: No hyperdense vessel or unexpected calcification. Skull: Normal. Negative for fracture or focal lesion. Sinuses/Orbits: No acute finding. Other: None. CT CERVICAL SPINE FINDINGS Alignment: Normal. Skull base and vertebrae: No acute fracture. No primary bone lesion or focal pathologic process. Soft tissues and spinal canal: No prevertebral fluid or swelling. No visible canal hematoma. Disc levels: Generally mild disc space height loss throughout the cervical spine with large anterior bridging osteophytes. Upper chest: Negative. Other: None. IMPRESSION: 1. No acute intracranial pathology. Small-vessel white matter disease. 2. No fracture or static subluxation of the cervical spine. 3. Generally mild  disc space height loss throughout the  cervical spine with large anterior bridging osteophytes. Electronically Signed   By: Lauralyn PrimesAlex  Bibbey M.D.   On: 10/22/2020 15:32   CT Cervical Spine Wo Contrast  Result Date: 10/22/2020 CLINICAL DATA:  Head trauma EXAM: CT HEAD WITHOUT CONTRAST CT CERVICAL SPINE WITHOUT CONTRAST TECHNIQUE: Multidetector CT imaging of the head and cervical spine was performed following the standard protocol without intravenous contrast. Multiplanar CT image reconstructions of the cervical spine were also generated. COMPARISON:  10/16/2020 FINDINGS: CT HEAD FINDINGS Brain: No evidence of acute infarction, hemorrhage, hydrocephalus, extra-axial collection or mass lesion/mass effect. Periventricular and deep white matter hypodensity. Vascular: No hyperdense vessel or unexpected calcification. Skull: Normal. Negative for fracture or focal lesion. Sinuses/Orbits: No acute finding. Other: None. CT CERVICAL SPINE FINDINGS Alignment: Normal. Skull base and vertebrae: No acute fracture. No primary bone lesion or focal pathologic process. Soft tissues and spinal canal: No prevertebral fluid or swelling. No visible canal hematoma. Disc levels: Generally mild disc space height loss throughout the cervical spine with large anterior bridging osteophytes. Upper chest: Negative. Other: None. IMPRESSION: 1. No acute intracranial pathology. Small-vessel white matter disease. 2. No fracture or static subluxation of the cervical spine. 3. Generally mild disc space height loss throughout the cervical spine with large anterior bridging osteophytes. Electronically Signed   By: Lauralyn PrimesAlex  Bibbey M.D.   On: 10/22/2020 15:32   DG Shoulder Left  Result Date: 10/22/2020 CLINICAL DATA:  Fall, dementia, RIGHT hip pain, RIGHT knee pain, tenderness LEFT shoulder, initial encounter EXAM: LEFT SHOULDER - 2+ VIEW COMPARISON:  None FINDINGS: Osseous demineralization. Mild degenerative changes LEFT AC joint. Bulky inferior acromial spur formation. Visualized ribs  intact. No acute fracture, dislocation, or bone destruction. IMPRESSION: No acute osseous abnormalities. Osseous demineralization with AC joint degenerative changes and bulky inferior acromial spur formation. Electronically Signed   By: Ulyses SouthwardMark  Boles M.D.   On: 10/22/2020 15:13   DG Knee Complete 4 Views Right  Result Date: 10/22/2020 CLINICAL DATA:  Fall, dementia, RIGHT hip pain, RIGHT knee pain, tenderness LEFT shoulder, initial encounter EXAM: RIGHT KNEE - COMPLETE 4+ VIEW COMPARISON:  None FINDINGS: Osseous demineralization. Joint space narrowing and minimal chondrocalcinosis. No acute fracture, dislocation, or bone destruction. No joint effusion. Patellar spurs at quadriceps and patellar tendon insertions. Atherosclerotic calcifications of the superficial femoral artery. 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 No acute osseous abnormalities. Electronically Signed   By: Ulyses SouthwardMark  Boles M.D.   On: 10/22/2020 15:15   DG Hip Unilat W or Wo Pelvis 2-3 Views Right  Result Date: 10/22/2020 CLINICAL DATA:  Fall, dementia, RIGHT hip pain, RIGHT knee pain, tenderness LEFT shoulder, initial encounter EXAM: DG HIP (WITH OR WITHOUT PELVIS) 2-3V RIGHT COMPARISON:  10/16/2020 pelvic radiograph FINDINGS: Osseous demineralization. SI joints preserved. Minimal narrowing of the hip joints. Three cannulated screws at proximal RIGHT femur post pinning of an old healed subcapital RIGHT femoral neck fracture. No acute fracture, dislocation, or bone destruction. Scattered vascular calcifications. IMPRESSION: Prior pinning of a RIGHT femoral neck fracture. No acute abnormalities. Electronically Signed   By: Ulyses SouthwardMark  Boles M.D.   On: 10/22/2020 15:15    Procedures Procedures   Medications Ordered in ED Medications  acetaminophen (TYLENOL) tablet 1,000 mg (1,000 mg Oral Given 10/22/20 1431)  potassium chloride SA (KLOR-CON) CR tablet 40 mEq (40 mEq Oral Given 10/22/20 1741)    ED Course  I have  reviewed the triage vital signs and the nursing notes.  Pertinent  labs & imaging results that were available during my care of the patient were reviewed by me and considered in my medical decision making (see chart for details).    MDM Rules/Calculators/A&P                          No significant trauma found on workup. Small abrasion but otherwise appears well. ECG with nonspecific T waves, which she has had before with hypokalemia. Thus labs were checked and she does have moderate hypokalemia. QTC is ok. No vomiting/diarrhea/illness. Thus will replete with K and Mag and have patient follow up with PCP. 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 My suspicion that this was syncope/arrhythmia is pretty low, she has a known longstanding history of falls. D/c home with return precautions.  Final Clinical Impression(s) / ED Diagnoses Final diagnoses:  Fall, initial encounter  Hypokalemia    Rx / DC Orders ED Discharge Orders         Ordered    potassium chloride SA (KLOR-CON) 20 MEQ tablet  2 times daily        10/22/20 1721    Magnesium Oxide (MAG-OXIDE) 200 MG TABS  Daily        10/22/20 1721           Pricilla Loveless, MD 10/22/20 1905

## 2020-10-22 NOTE — ED Notes (Signed)
Pt took meds, helped pt get dressed, went over dc papers & rx w son, rx given to son, advised to follow up w PCP in 1 week to have K+ rechecked. Son understood, brought pt wheelchair, pt ready for discharge

## 2020-10-22 NOTE — ED Triage Notes (Signed)
Patient here from [facility] brought in by son reporting fall. States that that she "has a lot of falls". Dementia. States that he is unsure of what she hurt but thinks it was her right knee and head.

## 2020-10-23 DIAGNOSIS — S72001D Fracture of unspecified part of neck of right femur, subsequent encounter for closed fracture with routine healing: Secondary | ICD-10-CM | POA: Diagnosis not present

## 2020-10-23 DIAGNOSIS — M5030 Other cervical disc degeneration, unspecified cervical region: Secondary | ICD-10-CM | POA: Diagnosis not present

## 2020-10-23 DIAGNOSIS — I2510 Atherosclerotic heart disease of native coronary artery without angina pectoris: Secondary | ICD-10-CM | POA: Diagnosis not present

## 2020-10-23 DIAGNOSIS — E440 Moderate protein-calorie malnutrition: Secondary | ICD-10-CM | POA: Diagnosis not present

## 2020-10-23 DIAGNOSIS — S066X9D Traumatic subarachnoid hemorrhage with loss of consciousness of unspecified duration, subsequent encounter: Secondary | ICD-10-CM | POA: Diagnosis not present

## 2020-10-23 DIAGNOSIS — I10 Essential (primary) hypertension: Secondary | ICD-10-CM | POA: Diagnosis not present

## 2020-10-23 DIAGNOSIS — M5136 Other intervertebral disc degeneration, lumbar region: Secondary | ICD-10-CM | POA: Diagnosis not present

## 2020-10-23 DIAGNOSIS — R69 Illness, unspecified: Secondary | ICD-10-CM | POA: Diagnosis not present

## 2020-10-25 DIAGNOSIS — W19XXXA Unspecified fall, initial encounter: Secondary | ICD-10-CM | POA: Diagnosis not present

## 2020-10-25 DIAGNOSIS — E876 Hypokalemia: Secondary | ICD-10-CM | POA: Diagnosis not present

## 2020-10-31 DIAGNOSIS — M5136 Other intervertebral disc degeneration, lumbar region: Secondary | ICD-10-CM | POA: Diagnosis not present

## 2020-10-31 DIAGNOSIS — S066X9D Traumatic subarachnoid hemorrhage with loss of consciousness of unspecified duration, subsequent encounter: Secondary | ICD-10-CM | POA: Diagnosis not present

## 2020-10-31 DIAGNOSIS — R69 Illness, unspecified: Secondary | ICD-10-CM | POA: Diagnosis not present

## 2020-10-31 DIAGNOSIS — I10 Essential (primary) hypertension: Secondary | ICD-10-CM | POA: Diagnosis not present

## 2020-10-31 DIAGNOSIS — I2510 Atherosclerotic heart disease of native coronary artery without angina pectoris: Secondary | ICD-10-CM | POA: Diagnosis not present

## 2020-10-31 DIAGNOSIS — S72001D Fracture of unspecified part of neck of right femur, subsequent encounter for closed fracture with routine healing: Secondary | ICD-10-CM | POA: Diagnosis not present

## 2020-10-31 DIAGNOSIS — M5030 Other cervical disc degeneration, unspecified cervical region: Secondary | ICD-10-CM | POA: Diagnosis not present

## 2020-10-31 DIAGNOSIS — E440 Moderate protein-calorie malnutrition: Secondary | ICD-10-CM | POA: Diagnosis not present

## 2020-11-08 DIAGNOSIS — R69 Illness, unspecified: Secondary | ICD-10-CM | POA: Diagnosis not present

## 2020-11-08 DIAGNOSIS — M5136 Other intervertebral disc degeneration, lumbar region: Secondary | ICD-10-CM | POA: Diagnosis not present

## 2020-11-08 DIAGNOSIS — M5030 Other cervical disc degeneration, unspecified cervical region: Secondary | ICD-10-CM | POA: Diagnosis not present

## 2020-11-08 DIAGNOSIS — S066X9D Traumatic subarachnoid hemorrhage with loss of consciousness of unspecified duration, subsequent encounter: Secondary | ICD-10-CM | POA: Diagnosis not present

## 2020-11-08 DIAGNOSIS — I10 Essential (primary) hypertension: Secondary | ICD-10-CM | POA: Diagnosis not present

## 2020-11-08 DIAGNOSIS — I2510 Atherosclerotic heart disease of native coronary artery without angina pectoris: Secondary | ICD-10-CM | POA: Diagnosis not present

## 2020-11-08 DIAGNOSIS — S72001D Fracture of unspecified part of neck of right femur, subsequent encounter for closed fracture with routine healing: Secondary | ICD-10-CM | POA: Diagnosis not present

## 2020-11-08 DIAGNOSIS — E440 Moderate protein-calorie malnutrition: Secondary | ICD-10-CM | POA: Diagnosis not present

## 2020-11-14 DIAGNOSIS — F419 Anxiety disorder, unspecified: Secondary | ICD-10-CM | POA: Diagnosis not present

## 2020-11-14 DIAGNOSIS — R69 Illness, unspecified: Secondary | ICD-10-CM | POA: Diagnosis not present

## 2020-11-14 DIAGNOSIS — F331 Major depressive disorder, recurrent, moderate: Secondary | ICD-10-CM | POA: Diagnosis not present

## 2020-11-15 DIAGNOSIS — A0811 Acute gastroenteropathy due to Norwalk agent: Secondary | ICD-10-CM | POA: Diagnosis not present

## 2020-12-04 DIAGNOSIS — F419 Anxiety disorder, unspecified: Secondary | ICD-10-CM | POA: Diagnosis not present

## 2020-12-04 DIAGNOSIS — F331 Major depressive disorder, recurrent, moderate: Secondary | ICD-10-CM | POA: Diagnosis not present

## 2020-12-04 DIAGNOSIS — R69 Illness, unspecified: Secondary | ICD-10-CM | POA: Diagnosis not present

## 2020-12-06 DIAGNOSIS — R69 Illness, unspecified: Secondary | ICD-10-CM | POA: Diagnosis not present

## 2020-12-06 DIAGNOSIS — M6281 Muscle weakness (generalized): Secondary | ICD-10-CM | POA: Diagnosis not present

## 2020-12-06 DIAGNOSIS — M1991 Primary osteoarthritis, unspecified site: Secondary | ICD-10-CM | POA: Diagnosis not present

## 2020-12-06 DIAGNOSIS — R296 Repeated falls: Secondary | ICD-10-CM | POA: Diagnosis not present

## 2020-12-13 DIAGNOSIS — M6281 Muscle weakness (generalized): Secondary | ICD-10-CM | POA: Diagnosis not present

## 2020-12-13 DIAGNOSIS — R69 Illness, unspecified: Secondary | ICD-10-CM | POA: Diagnosis not present

## 2020-12-13 DIAGNOSIS — M1991 Primary osteoarthritis, unspecified site: Secondary | ICD-10-CM | POA: Diagnosis not present

## 2020-12-13 DIAGNOSIS — R296 Repeated falls: Secondary | ICD-10-CM | POA: Diagnosis not present

## 2020-12-18 DIAGNOSIS — E785 Hyperlipidemia, unspecified: Secondary | ICD-10-CM | POA: Diagnosis not present

## 2020-12-18 DIAGNOSIS — K219 Gastro-esophageal reflux disease without esophagitis: Secondary | ICD-10-CM | POA: Diagnosis not present

## 2020-12-18 DIAGNOSIS — F419 Anxiety disorder, unspecified: Secondary | ICD-10-CM | POA: Diagnosis not present

## 2020-12-18 DIAGNOSIS — M545 Low back pain, unspecified: Secondary | ICD-10-CM | POA: Diagnosis not present

## 2020-12-18 DIAGNOSIS — R69 Illness, unspecified: Secondary | ICD-10-CM | POA: Diagnosis not present

## 2020-12-18 DIAGNOSIS — E440 Moderate protein-calorie malnutrition: Secondary | ICD-10-CM | POA: Diagnosis not present

## 2020-12-18 DIAGNOSIS — I10 Essential (primary) hypertension: Secondary | ICD-10-CM | POA: Diagnosis not present

## 2020-12-18 DIAGNOSIS — I2510 Atherosclerotic heart disease of native coronary artery without angina pectoris: Secondary | ICD-10-CM | POA: Diagnosis not present

## 2020-12-18 DIAGNOSIS — I252 Old myocardial infarction: Secondary | ICD-10-CM | POA: Diagnosis not present

## 2020-12-18 DIAGNOSIS — F32A Depression, unspecified: Secondary | ICD-10-CM | POA: Diagnosis not present

## 2020-12-19 DIAGNOSIS — K219 Gastro-esophageal reflux disease without esophagitis: Secondary | ICD-10-CM | POA: Diagnosis not present

## 2020-12-19 DIAGNOSIS — M545 Low back pain, unspecified: Secondary | ICD-10-CM | POA: Diagnosis not present

## 2020-12-19 DIAGNOSIS — I252 Old myocardial infarction: Secondary | ICD-10-CM | POA: Diagnosis not present

## 2020-12-19 DIAGNOSIS — E440 Moderate protein-calorie malnutrition: Secondary | ICD-10-CM | POA: Diagnosis not present

## 2020-12-19 DIAGNOSIS — E785 Hyperlipidemia, unspecified: Secondary | ICD-10-CM | POA: Diagnosis not present

## 2020-12-19 DIAGNOSIS — I2510 Atherosclerotic heart disease of native coronary artery without angina pectoris: Secondary | ICD-10-CM | POA: Diagnosis not present

## 2020-12-19 DIAGNOSIS — F419 Anxiety disorder, unspecified: Secondary | ICD-10-CM | POA: Diagnosis not present

## 2020-12-19 DIAGNOSIS — F32A Depression, unspecified: Secondary | ICD-10-CM | POA: Diagnosis not present

## 2020-12-19 DIAGNOSIS — I10 Essential (primary) hypertension: Secondary | ICD-10-CM | POA: Diagnosis not present

## 2020-12-19 DIAGNOSIS — R69 Illness, unspecified: Secondary | ICD-10-CM | POA: Diagnosis not present

## 2020-12-22 DIAGNOSIS — I252 Old myocardial infarction: Secondary | ICD-10-CM | POA: Diagnosis not present

## 2020-12-22 DIAGNOSIS — M545 Low back pain, unspecified: Secondary | ICD-10-CM | POA: Diagnosis not present

## 2020-12-22 DIAGNOSIS — I10 Essential (primary) hypertension: Secondary | ICD-10-CM | POA: Diagnosis not present

## 2020-12-22 DIAGNOSIS — R69 Illness, unspecified: Secondary | ICD-10-CM | POA: Diagnosis not present

## 2020-12-22 DIAGNOSIS — F32A Depression, unspecified: Secondary | ICD-10-CM | POA: Diagnosis not present

## 2020-12-22 DIAGNOSIS — E440 Moderate protein-calorie malnutrition: Secondary | ICD-10-CM | POA: Diagnosis not present

## 2020-12-22 DIAGNOSIS — K219 Gastro-esophageal reflux disease without esophagitis: Secondary | ICD-10-CM | POA: Diagnosis not present

## 2020-12-22 DIAGNOSIS — F419 Anxiety disorder, unspecified: Secondary | ICD-10-CM | POA: Diagnosis not present

## 2020-12-22 DIAGNOSIS — I2510 Atherosclerotic heart disease of native coronary artery without angina pectoris: Secondary | ICD-10-CM | POA: Diagnosis not present

## 2020-12-22 DIAGNOSIS — E785 Hyperlipidemia, unspecified: Secondary | ICD-10-CM | POA: Diagnosis not present

## 2020-12-25 DIAGNOSIS — M545 Low back pain, unspecified: Secondary | ICD-10-CM | POA: Diagnosis not present

## 2020-12-25 DIAGNOSIS — K219 Gastro-esophageal reflux disease without esophagitis: Secondary | ICD-10-CM | POA: Diagnosis not present

## 2020-12-25 DIAGNOSIS — E785 Hyperlipidemia, unspecified: Secondary | ICD-10-CM | POA: Diagnosis not present

## 2020-12-25 DIAGNOSIS — R69 Illness, unspecified: Secondary | ICD-10-CM | POA: Diagnosis not present

## 2020-12-25 DIAGNOSIS — F419 Anxiety disorder, unspecified: Secondary | ICD-10-CM | POA: Diagnosis not present

## 2020-12-25 DIAGNOSIS — I10 Essential (primary) hypertension: Secondary | ICD-10-CM | POA: Diagnosis not present

## 2020-12-25 DIAGNOSIS — E440 Moderate protein-calorie malnutrition: Secondary | ICD-10-CM | POA: Diagnosis not present

## 2020-12-25 DIAGNOSIS — I2510 Atherosclerotic heart disease of native coronary artery without angina pectoris: Secondary | ICD-10-CM | POA: Diagnosis not present

## 2020-12-25 DIAGNOSIS — F32A Depression, unspecified: Secondary | ICD-10-CM | POA: Diagnosis not present

## 2020-12-25 DIAGNOSIS — I252 Old myocardial infarction: Secondary | ICD-10-CM | POA: Diagnosis not present

## 2020-12-27 DIAGNOSIS — E785 Hyperlipidemia, unspecified: Secondary | ICD-10-CM | POA: Diagnosis not present

## 2020-12-27 DIAGNOSIS — I10 Essential (primary) hypertension: Secondary | ICD-10-CM | POA: Diagnosis not present

## 2020-12-27 DIAGNOSIS — E440 Moderate protein-calorie malnutrition: Secondary | ICD-10-CM | POA: Diagnosis not present

## 2020-12-27 DIAGNOSIS — R69 Illness, unspecified: Secondary | ICD-10-CM | POA: Diagnosis not present

## 2020-12-27 DIAGNOSIS — K219 Gastro-esophageal reflux disease without esophagitis: Secondary | ICD-10-CM | POA: Diagnosis not present

## 2020-12-27 DIAGNOSIS — F32A Depression, unspecified: Secondary | ICD-10-CM | POA: Diagnosis not present

## 2020-12-27 DIAGNOSIS — I252 Old myocardial infarction: Secondary | ICD-10-CM | POA: Diagnosis not present

## 2020-12-27 DIAGNOSIS — F419 Anxiety disorder, unspecified: Secondary | ICD-10-CM | POA: Diagnosis not present

## 2020-12-27 DIAGNOSIS — I2510 Atherosclerotic heart disease of native coronary artery without angina pectoris: Secondary | ICD-10-CM | POA: Diagnosis not present

## 2020-12-27 DIAGNOSIS — M545 Low back pain, unspecified: Secondary | ICD-10-CM | POA: Diagnosis not present

## 2021-01-01 DIAGNOSIS — M545 Low back pain, unspecified: Secondary | ICD-10-CM | POA: Diagnosis not present

## 2021-01-01 DIAGNOSIS — F32A Depression, unspecified: Secondary | ICD-10-CM | POA: Diagnosis not present

## 2021-01-01 DIAGNOSIS — E785 Hyperlipidemia, unspecified: Secondary | ICD-10-CM | POA: Diagnosis not present

## 2021-01-01 DIAGNOSIS — I2510 Atherosclerotic heart disease of native coronary artery without angina pectoris: Secondary | ICD-10-CM | POA: Diagnosis not present

## 2021-01-01 DIAGNOSIS — K219 Gastro-esophageal reflux disease without esophagitis: Secondary | ICD-10-CM | POA: Diagnosis not present

## 2021-01-01 DIAGNOSIS — I10 Essential (primary) hypertension: Secondary | ICD-10-CM | POA: Diagnosis not present

## 2021-01-01 DIAGNOSIS — I252 Old myocardial infarction: Secondary | ICD-10-CM | POA: Diagnosis not present

## 2021-01-01 DIAGNOSIS — R69 Illness, unspecified: Secondary | ICD-10-CM | POA: Diagnosis not present

## 2021-01-01 DIAGNOSIS — E440 Moderate protein-calorie malnutrition: Secondary | ICD-10-CM | POA: Diagnosis not present

## 2021-01-01 DIAGNOSIS — F419 Anxiety disorder, unspecified: Secondary | ICD-10-CM | POA: Diagnosis not present

## 2021-01-03 DIAGNOSIS — I252 Old myocardial infarction: Secondary | ICD-10-CM | POA: Diagnosis not present

## 2021-01-03 DIAGNOSIS — M545 Low back pain, unspecified: Secondary | ICD-10-CM | POA: Diagnosis not present

## 2021-01-03 DIAGNOSIS — I10 Essential (primary) hypertension: Secondary | ICD-10-CM | POA: Diagnosis not present

## 2021-01-03 DIAGNOSIS — E440 Moderate protein-calorie malnutrition: Secondary | ICD-10-CM | POA: Diagnosis not present

## 2021-01-03 DIAGNOSIS — K219 Gastro-esophageal reflux disease without esophagitis: Secondary | ICD-10-CM | POA: Diagnosis not present

## 2021-01-03 DIAGNOSIS — R69 Illness, unspecified: Secondary | ICD-10-CM | POA: Diagnosis not present

## 2021-01-03 DIAGNOSIS — F419 Anxiety disorder, unspecified: Secondary | ICD-10-CM | POA: Diagnosis not present

## 2021-01-03 DIAGNOSIS — I2510 Atherosclerotic heart disease of native coronary artery without angina pectoris: Secondary | ICD-10-CM | POA: Diagnosis not present

## 2021-01-03 DIAGNOSIS — E785 Hyperlipidemia, unspecified: Secondary | ICD-10-CM | POA: Diagnosis not present

## 2021-01-03 DIAGNOSIS — F32A Depression, unspecified: Secondary | ICD-10-CM | POA: Diagnosis not present

## 2021-01-08 DIAGNOSIS — R69 Illness, unspecified: Secondary | ICD-10-CM | POA: Diagnosis not present

## 2021-01-08 DIAGNOSIS — I2510 Atherosclerotic heart disease of native coronary artery without angina pectoris: Secondary | ICD-10-CM | POA: Diagnosis not present

## 2021-01-08 DIAGNOSIS — F419 Anxiety disorder, unspecified: Secondary | ICD-10-CM | POA: Diagnosis not present

## 2021-01-08 DIAGNOSIS — I10 Essential (primary) hypertension: Secondary | ICD-10-CM | POA: Diagnosis not present

## 2021-01-08 DIAGNOSIS — F32A Depression, unspecified: Secondary | ICD-10-CM | POA: Diagnosis not present

## 2021-01-08 DIAGNOSIS — K219 Gastro-esophageal reflux disease without esophagitis: Secondary | ICD-10-CM | POA: Diagnosis not present

## 2021-01-08 DIAGNOSIS — M545 Low back pain, unspecified: Secondary | ICD-10-CM | POA: Diagnosis not present

## 2021-01-08 DIAGNOSIS — I252 Old myocardial infarction: Secondary | ICD-10-CM | POA: Diagnosis not present

## 2021-01-08 DIAGNOSIS — E440 Moderate protein-calorie malnutrition: Secondary | ICD-10-CM | POA: Diagnosis not present

## 2021-01-08 DIAGNOSIS — E785 Hyperlipidemia, unspecified: Secondary | ICD-10-CM | POA: Diagnosis not present

## 2021-01-08 DIAGNOSIS — F331 Major depressive disorder, recurrent, moderate: Secondary | ICD-10-CM | POA: Diagnosis not present

## 2021-01-09 DIAGNOSIS — I252 Old myocardial infarction: Secondary | ICD-10-CM | POA: Diagnosis not present

## 2021-01-09 DIAGNOSIS — R69 Illness, unspecified: Secondary | ICD-10-CM | POA: Diagnosis not present

## 2021-01-09 DIAGNOSIS — K219 Gastro-esophageal reflux disease without esophagitis: Secondary | ICD-10-CM | POA: Diagnosis not present

## 2021-01-09 DIAGNOSIS — E440 Moderate protein-calorie malnutrition: Secondary | ICD-10-CM | POA: Diagnosis not present

## 2021-01-09 DIAGNOSIS — M545 Low back pain, unspecified: Secondary | ICD-10-CM | POA: Diagnosis not present

## 2021-01-09 DIAGNOSIS — E785 Hyperlipidemia, unspecified: Secondary | ICD-10-CM | POA: Diagnosis not present

## 2021-01-09 DIAGNOSIS — F419 Anxiety disorder, unspecified: Secondary | ICD-10-CM | POA: Diagnosis not present

## 2021-01-09 DIAGNOSIS — I10 Essential (primary) hypertension: Secondary | ICD-10-CM | POA: Diagnosis not present

## 2021-01-09 DIAGNOSIS — F32A Depression, unspecified: Secondary | ICD-10-CM | POA: Diagnosis not present

## 2021-01-09 DIAGNOSIS — I2510 Atherosclerotic heart disease of native coronary artery without angina pectoris: Secondary | ICD-10-CM | POA: Diagnosis not present

## 2021-01-10 DIAGNOSIS — E440 Moderate protein-calorie malnutrition: Secondary | ICD-10-CM | POA: Diagnosis not present

## 2021-01-10 DIAGNOSIS — I10 Essential (primary) hypertension: Secondary | ICD-10-CM | POA: Diagnosis not present

## 2021-01-10 DIAGNOSIS — F32A Depression, unspecified: Secondary | ICD-10-CM | POA: Diagnosis not present

## 2021-01-10 DIAGNOSIS — R69 Illness, unspecified: Secondary | ICD-10-CM | POA: Diagnosis not present

## 2021-01-10 DIAGNOSIS — F419 Anxiety disorder, unspecified: Secondary | ICD-10-CM | POA: Diagnosis not present

## 2021-01-10 DIAGNOSIS — I252 Old myocardial infarction: Secondary | ICD-10-CM | POA: Diagnosis not present

## 2021-01-10 DIAGNOSIS — M545 Low back pain, unspecified: Secondary | ICD-10-CM | POA: Diagnosis not present

## 2021-01-10 DIAGNOSIS — K219 Gastro-esophageal reflux disease without esophagitis: Secondary | ICD-10-CM | POA: Diagnosis not present

## 2021-01-10 DIAGNOSIS — I2510 Atherosclerotic heart disease of native coronary artery without angina pectoris: Secondary | ICD-10-CM | POA: Diagnosis not present

## 2021-01-10 DIAGNOSIS — E785 Hyperlipidemia, unspecified: Secondary | ICD-10-CM | POA: Diagnosis not present

## 2021-01-11 DIAGNOSIS — E876 Hypokalemia: Secondary | ICD-10-CM | POA: Diagnosis not present

## 2021-01-15 DIAGNOSIS — I252 Old myocardial infarction: Secondary | ICD-10-CM | POA: Diagnosis not present

## 2021-01-15 DIAGNOSIS — F419 Anxiety disorder, unspecified: Secondary | ICD-10-CM | POA: Diagnosis not present

## 2021-01-15 DIAGNOSIS — M545 Low back pain, unspecified: Secondary | ICD-10-CM | POA: Diagnosis not present

## 2021-01-15 DIAGNOSIS — F32A Depression, unspecified: Secondary | ICD-10-CM | POA: Diagnosis not present

## 2021-01-15 DIAGNOSIS — E785 Hyperlipidemia, unspecified: Secondary | ICD-10-CM | POA: Diagnosis not present

## 2021-01-15 DIAGNOSIS — E440 Moderate protein-calorie malnutrition: Secondary | ICD-10-CM | POA: Diagnosis not present

## 2021-01-15 DIAGNOSIS — R69 Illness, unspecified: Secondary | ICD-10-CM | POA: Diagnosis not present

## 2021-01-15 DIAGNOSIS — I10 Essential (primary) hypertension: Secondary | ICD-10-CM | POA: Diagnosis not present

## 2021-01-15 DIAGNOSIS — I2510 Atherosclerotic heart disease of native coronary artery without angina pectoris: Secondary | ICD-10-CM | POA: Diagnosis not present

## 2021-01-15 DIAGNOSIS — K219 Gastro-esophageal reflux disease without esophagitis: Secondary | ICD-10-CM | POA: Diagnosis not present

## 2021-01-16 DIAGNOSIS — K219 Gastro-esophageal reflux disease without esophagitis: Secondary | ICD-10-CM | POA: Diagnosis not present

## 2021-01-16 DIAGNOSIS — E440 Moderate protein-calorie malnutrition: Secondary | ICD-10-CM | POA: Diagnosis not present

## 2021-01-16 DIAGNOSIS — F32A Depression, unspecified: Secondary | ICD-10-CM | POA: Diagnosis not present

## 2021-01-16 DIAGNOSIS — M545 Low back pain, unspecified: Secondary | ICD-10-CM | POA: Diagnosis not present

## 2021-01-16 DIAGNOSIS — F59 Unspecified behavioral syndromes associated with physiological disturbances and physical factors: Secondary | ICD-10-CM | POA: Diagnosis not present

## 2021-01-16 DIAGNOSIS — I10 Essential (primary) hypertension: Secondary | ICD-10-CM | POA: Diagnosis not present

## 2021-01-16 DIAGNOSIS — R69 Illness, unspecified: Secondary | ICD-10-CM | POA: Diagnosis not present

## 2021-01-16 DIAGNOSIS — I252 Old myocardial infarction: Secondary | ICD-10-CM | POA: Diagnosis not present

## 2021-01-16 DIAGNOSIS — F331 Major depressive disorder, recurrent, moderate: Secondary | ICD-10-CM | POA: Diagnosis not present

## 2021-01-16 DIAGNOSIS — F419 Anxiety disorder, unspecified: Secondary | ICD-10-CM | POA: Diagnosis not present

## 2021-01-16 DIAGNOSIS — E785 Hyperlipidemia, unspecified: Secondary | ICD-10-CM | POA: Diagnosis not present

## 2021-01-16 DIAGNOSIS — I2510 Atherosclerotic heart disease of native coronary artery without angina pectoris: Secondary | ICD-10-CM | POA: Diagnosis not present

## 2021-01-22 DIAGNOSIS — I252 Old myocardial infarction: Secondary | ICD-10-CM | POA: Diagnosis not present

## 2021-01-22 DIAGNOSIS — I2510 Atherosclerotic heart disease of native coronary artery without angina pectoris: Secondary | ICD-10-CM | POA: Diagnosis not present

## 2021-01-22 DIAGNOSIS — R69 Illness, unspecified: Secondary | ICD-10-CM | POA: Diagnosis not present

## 2021-01-22 DIAGNOSIS — M545 Low back pain, unspecified: Secondary | ICD-10-CM | POA: Diagnosis not present

## 2021-01-22 DIAGNOSIS — E440 Moderate protein-calorie malnutrition: Secondary | ICD-10-CM | POA: Diagnosis not present

## 2021-01-22 DIAGNOSIS — F32A Depression, unspecified: Secondary | ICD-10-CM | POA: Diagnosis not present

## 2021-01-22 DIAGNOSIS — K219 Gastro-esophageal reflux disease without esophagitis: Secondary | ICD-10-CM | POA: Diagnosis not present

## 2021-01-22 DIAGNOSIS — F419 Anxiety disorder, unspecified: Secondary | ICD-10-CM | POA: Diagnosis not present

## 2021-01-22 DIAGNOSIS — E785 Hyperlipidemia, unspecified: Secondary | ICD-10-CM | POA: Diagnosis not present

## 2021-01-22 DIAGNOSIS — I10 Essential (primary) hypertension: Secondary | ICD-10-CM | POA: Diagnosis not present

## 2021-01-29 DIAGNOSIS — K219 Gastro-esophageal reflux disease without esophagitis: Secondary | ICD-10-CM | POA: Diagnosis not present

## 2021-01-29 DIAGNOSIS — E785 Hyperlipidemia, unspecified: Secondary | ICD-10-CM | POA: Diagnosis not present

## 2021-01-29 DIAGNOSIS — I252 Old myocardial infarction: Secondary | ICD-10-CM | POA: Diagnosis not present

## 2021-01-29 DIAGNOSIS — I2510 Atherosclerotic heart disease of native coronary artery without angina pectoris: Secondary | ICD-10-CM | POA: Diagnosis not present

## 2021-01-29 DIAGNOSIS — F419 Anxiety disorder, unspecified: Secondary | ICD-10-CM | POA: Diagnosis not present

## 2021-01-29 DIAGNOSIS — M545 Low back pain, unspecified: Secondary | ICD-10-CM | POA: Diagnosis not present

## 2021-01-29 DIAGNOSIS — R69 Illness, unspecified: Secondary | ICD-10-CM | POA: Diagnosis not present

## 2021-01-29 DIAGNOSIS — I10 Essential (primary) hypertension: Secondary | ICD-10-CM | POA: Diagnosis not present

## 2021-01-29 DIAGNOSIS — E440 Moderate protein-calorie malnutrition: Secondary | ICD-10-CM | POA: Diagnosis not present

## 2021-01-29 DIAGNOSIS — F32A Depression, unspecified: Secondary | ICD-10-CM | POA: Diagnosis not present

## 2021-02-05 DIAGNOSIS — M545 Low back pain, unspecified: Secondary | ICD-10-CM | POA: Diagnosis not present

## 2021-02-05 DIAGNOSIS — E440 Moderate protein-calorie malnutrition: Secondary | ICD-10-CM | POA: Diagnosis not present

## 2021-02-05 DIAGNOSIS — I2510 Atherosclerotic heart disease of native coronary artery without angina pectoris: Secondary | ICD-10-CM | POA: Diagnosis not present

## 2021-02-05 DIAGNOSIS — I10 Essential (primary) hypertension: Secondary | ICD-10-CM | POA: Diagnosis not present

## 2021-02-05 DIAGNOSIS — I252 Old myocardial infarction: Secondary | ICD-10-CM | POA: Diagnosis not present

## 2021-02-05 DIAGNOSIS — E785 Hyperlipidemia, unspecified: Secondary | ICD-10-CM | POA: Diagnosis not present

## 2021-02-05 DIAGNOSIS — R69 Illness, unspecified: Secondary | ICD-10-CM | POA: Diagnosis not present

## 2021-02-05 DIAGNOSIS — F32A Depression, unspecified: Secondary | ICD-10-CM | POA: Diagnosis not present

## 2021-02-05 DIAGNOSIS — F419 Anxiety disorder, unspecified: Secondary | ICD-10-CM | POA: Diagnosis not present

## 2021-02-05 DIAGNOSIS — K219 Gastro-esophageal reflux disease without esophagitis: Secondary | ICD-10-CM | POA: Diagnosis not present

## 2021-02-12 DIAGNOSIS — I10 Essential (primary) hypertension: Secondary | ICD-10-CM | POA: Diagnosis not present

## 2021-02-12 DIAGNOSIS — I2510 Atherosclerotic heart disease of native coronary artery without angina pectoris: Secondary | ICD-10-CM | POA: Diagnosis not present

## 2021-02-12 DIAGNOSIS — E785 Hyperlipidemia, unspecified: Secondary | ICD-10-CM | POA: Diagnosis not present

## 2021-02-12 DIAGNOSIS — K219 Gastro-esophageal reflux disease without esophagitis: Secondary | ICD-10-CM | POA: Diagnosis not present

## 2021-02-12 DIAGNOSIS — M545 Low back pain, unspecified: Secondary | ICD-10-CM | POA: Diagnosis not present

## 2021-02-12 DIAGNOSIS — F419 Anxiety disorder, unspecified: Secondary | ICD-10-CM | POA: Diagnosis not present

## 2021-02-12 DIAGNOSIS — I252 Old myocardial infarction: Secondary | ICD-10-CM | POA: Diagnosis not present

## 2021-02-12 DIAGNOSIS — F32A Depression, unspecified: Secondary | ICD-10-CM | POA: Diagnosis not present

## 2021-02-12 DIAGNOSIS — R69 Illness, unspecified: Secondary | ICD-10-CM | POA: Diagnosis not present

## 2021-02-12 DIAGNOSIS — E440 Moderate protein-calorie malnutrition: Secondary | ICD-10-CM | POA: Diagnosis not present

## 2021-02-15 DIAGNOSIS — F331 Major depressive disorder, recurrent, moderate: Secondary | ICD-10-CM | POA: Diagnosis not present

## 2021-02-15 DIAGNOSIS — R69 Illness, unspecified: Secondary | ICD-10-CM | POA: Diagnosis not present

## 2021-02-15 DIAGNOSIS — F419 Anxiety disorder, unspecified: Secondary | ICD-10-CM | POA: Diagnosis not present

## 2021-02-16 DIAGNOSIS — Z79899 Other long term (current) drug therapy: Secondary | ICD-10-CM | POA: Diagnosis not present

## 2021-02-21 DIAGNOSIS — N390 Urinary tract infection, site not specified: Secondary | ICD-10-CM | POA: Diagnosis not present

## 2021-03-15 DIAGNOSIS — R69 Illness, unspecified: Secondary | ICD-10-CM | POA: Diagnosis not present

## 2021-03-15 DIAGNOSIS — F331 Major depressive disorder, recurrent, moderate: Secondary | ICD-10-CM | POA: Diagnosis not present

## 2021-03-15 DIAGNOSIS — F419 Anxiety disorder, unspecified: Secondary | ICD-10-CM | POA: Diagnosis not present

## 2021-04-19 DIAGNOSIS — F0391 Unspecified dementia with behavioral disturbance: Secondary | ICD-10-CM | POA: Diagnosis not present

## 2021-04-19 DIAGNOSIS — F331 Major depressive disorder, recurrent, moderate: Secondary | ICD-10-CM | POA: Diagnosis not present

## 2021-04-19 DIAGNOSIS — F419 Anxiety disorder, unspecified: Secondary | ICD-10-CM | POA: Diagnosis not present

## 2021-04-19 DIAGNOSIS — R69 Illness, unspecified: Secondary | ICD-10-CM | POA: Diagnosis not present

## 2021-04-25 DIAGNOSIS — W19XXXA Unspecified fall, initial encounter: Secondary | ICD-10-CM | POA: Diagnosis not present

## 2021-04-25 DIAGNOSIS — M6281 Muscle weakness (generalized): Secondary | ICD-10-CM | POA: Diagnosis not present

## 2021-05-16 DIAGNOSIS — F419 Anxiety disorder, unspecified: Secondary | ICD-10-CM | POA: Diagnosis not present

## 2021-05-16 DIAGNOSIS — F331 Major depressive disorder, recurrent, moderate: Secondary | ICD-10-CM | POA: Diagnosis not present

## 2021-05-16 DIAGNOSIS — R69 Illness, unspecified: Secondary | ICD-10-CM | POA: Diagnosis not present

## 2021-05-22 DIAGNOSIS — F419 Anxiety disorder, unspecified: Secondary | ICD-10-CM | POA: Diagnosis not present

## 2021-05-22 DIAGNOSIS — F03918 Unspecified dementia, unspecified severity, with other behavioral disturbance: Secondary | ICD-10-CM | POA: Diagnosis not present

## 2021-05-22 DIAGNOSIS — F331 Major depressive disorder, recurrent, moderate: Secondary | ICD-10-CM | POA: Diagnosis not present

## 2021-05-22 DIAGNOSIS — R69 Illness, unspecified: Secondary | ICD-10-CM | POA: Diagnosis not present

## 2021-05-30 DIAGNOSIS — K219 Gastro-esophageal reflux disease without esophagitis: Secondary | ICD-10-CM | POA: Diagnosis not present

## 2021-05-30 DIAGNOSIS — R69 Illness, unspecified: Secondary | ICD-10-CM | POA: Diagnosis not present

## 2021-05-30 DIAGNOSIS — M6281 Muscle weakness (generalized): Secondary | ICD-10-CM | POA: Diagnosis not present

## 2021-05-30 DIAGNOSIS — I10 Essential (primary) hypertension: Secondary | ICD-10-CM | POA: Diagnosis not present

## 2021-05-30 DIAGNOSIS — E785 Hyperlipidemia, unspecified: Secondary | ICD-10-CM | POA: Diagnosis not present

## 2021-06-13 DIAGNOSIS — F03918 Unspecified dementia, unspecified severity, with other behavioral disturbance: Secondary | ICD-10-CM | POA: Diagnosis not present

## 2021-06-13 DIAGNOSIS — F331 Major depressive disorder, recurrent, moderate: Secondary | ICD-10-CM | POA: Diagnosis not present

## 2021-06-13 DIAGNOSIS — F419 Anxiety disorder, unspecified: Secondary | ICD-10-CM | POA: Diagnosis not present

## 2021-06-13 DIAGNOSIS — R69 Illness, unspecified: Secondary | ICD-10-CM | POA: Diagnosis not present

## 2021-06-27 DIAGNOSIS — Z0000 Encounter for general adult medical examination without abnormal findings: Secondary | ICD-10-CM | POA: Diagnosis not present

## 2021-06-27 DIAGNOSIS — K219 Gastro-esophageal reflux disease without esophagitis: Secondary | ICD-10-CM | POA: Diagnosis not present

## 2021-06-27 DIAGNOSIS — M6281 Muscle weakness (generalized): Secondary | ICD-10-CM | POA: Diagnosis not present

## 2021-06-27 DIAGNOSIS — R69 Illness, unspecified: Secondary | ICD-10-CM | POA: Diagnosis not present

## 2021-06-27 DIAGNOSIS — E785 Hyperlipidemia, unspecified: Secondary | ICD-10-CM | POA: Diagnosis not present

## 2021-06-27 DIAGNOSIS — I10 Essential (primary) hypertension: Secondary | ICD-10-CM | POA: Diagnosis not present

## 2021-06-27 DIAGNOSIS — R296 Repeated falls: Secondary | ICD-10-CM | POA: Diagnosis not present

## 2021-06-28 DIAGNOSIS — I10 Essential (primary) hypertension: Secondary | ICD-10-CM | POA: Diagnosis not present

## 2021-06-28 DIAGNOSIS — D519 Vitamin B12 deficiency anemia, unspecified: Secondary | ICD-10-CM | POA: Diagnosis not present

## 2021-06-28 DIAGNOSIS — E559 Vitamin D deficiency, unspecified: Secondary | ICD-10-CM | POA: Diagnosis not present

## 2021-06-28 DIAGNOSIS — E119 Type 2 diabetes mellitus without complications: Secondary | ICD-10-CM | POA: Diagnosis not present

## 2021-06-28 DIAGNOSIS — E785 Hyperlipidemia, unspecified: Secondary | ICD-10-CM | POA: Diagnosis not present

## 2021-06-28 DIAGNOSIS — E876 Hypokalemia: Secondary | ICD-10-CM | POA: Diagnosis not present

## 2021-07-09 DIAGNOSIS — F03918 Unspecified dementia, unspecified severity, with other behavioral disturbance: Secondary | ICD-10-CM | POA: Diagnosis not present

## 2021-07-09 DIAGNOSIS — R69 Illness, unspecified: Secondary | ICD-10-CM | POA: Diagnosis not present

## 2021-07-09 DIAGNOSIS — F419 Anxiety disorder, unspecified: Secondary | ICD-10-CM | POA: Diagnosis not present

## 2021-07-09 DIAGNOSIS — F331 Major depressive disorder, recurrent, moderate: Secondary | ICD-10-CM | POA: Diagnosis not present

## 2021-07-11 DIAGNOSIS — K219 Gastro-esophageal reflux disease without esophagitis: Secondary | ICD-10-CM | POA: Diagnosis not present

## 2021-07-11 DIAGNOSIS — M545 Low back pain, unspecified: Secondary | ICD-10-CM | POA: Diagnosis not present

## 2021-07-11 DIAGNOSIS — I10 Essential (primary) hypertension: Secondary | ICD-10-CM | POA: Diagnosis not present

## 2021-07-11 DIAGNOSIS — R69 Illness, unspecified: Secondary | ICD-10-CM | POA: Diagnosis not present

## 2021-07-11 DIAGNOSIS — R296 Repeated falls: Secondary | ICD-10-CM | POA: Diagnosis not present

## 2021-07-11 DIAGNOSIS — E785 Hyperlipidemia, unspecified: Secondary | ICD-10-CM | POA: Diagnosis not present

## 2021-07-11 DIAGNOSIS — Z8616 Personal history of COVID-19: Secondary | ICD-10-CM | POA: Diagnosis not present

## 2021-07-11 DIAGNOSIS — M6281 Muscle weakness (generalized): Secondary | ICD-10-CM | POA: Diagnosis not present

## 2021-08-08 DIAGNOSIS — F02811 Dementia in other diseases classified elsewhere, unspecified severity, with agitation: Secondary | ICD-10-CM | POA: Diagnosis not present

## 2021-08-08 DIAGNOSIS — E785 Hyperlipidemia, unspecified: Secondary | ICD-10-CM | POA: Diagnosis not present

## 2021-08-08 DIAGNOSIS — K219 Gastro-esophageal reflux disease without esophagitis: Secondary | ICD-10-CM | POA: Diagnosis not present

## 2021-08-08 DIAGNOSIS — M545 Low back pain, unspecified: Secondary | ICD-10-CM | POA: Diagnosis not present

## 2021-08-08 DIAGNOSIS — R296 Repeated falls: Secondary | ICD-10-CM | POA: Diagnosis not present

## 2021-08-08 DIAGNOSIS — I10 Essential (primary) hypertension: Secondary | ICD-10-CM | POA: Diagnosis not present

## 2021-08-08 DIAGNOSIS — M6281 Muscle weakness (generalized): Secondary | ICD-10-CM | POA: Diagnosis not present

## 2021-08-22 ENCOUNTER — Emergency Department (HOSPITAL_COMMUNITY): Payer: Medicare Other

## 2021-08-22 ENCOUNTER — Other Ambulatory Visit: Payer: Self-pay

## 2021-08-22 ENCOUNTER — Emergency Department (HOSPITAL_COMMUNITY)
Admission: EM | Admit: 2021-08-22 | Discharge: 2021-08-22 | Disposition: A | Discharge status: Home / Self Care | Payer: Medicare Other | Attending: Emergency Medicine | Admitting: Emergency Medicine

## 2021-08-22 ENCOUNTER — Encounter (HOSPITAL_COMMUNITY): Payer: Self-pay

## 2021-08-22 DIAGNOSIS — S0003XA Contusion of scalp, initial encounter: Secondary | ICD-10-CM | POA: Diagnosis not present

## 2021-08-22 DIAGNOSIS — F0390 Unspecified dementia without behavioral disturbance: Secondary | ICD-10-CM | POA: Diagnosis not present

## 2021-08-22 DIAGNOSIS — W19XXXA Unspecified fall, initial encounter: Secondary | ICD-10-CM | POA: Diagnosis not present

## 2021-08-22 DIAGNOSIS — W133XXA Fall through floor, initial encounter: Secondary | ICD-10-CM | POA: Diagnosis not present

## 2021-08-22 DIAGNOSIS — Z043 Encounter for examination and observation following other accident: Secondary | ICD-10-CM | POA: Diagnosis not present

## 2021-08-22 DIAGNOSIS — Z7982 Long term (current) use of aspirin: Secondary | ICD-10-CM | POA: Diagnosis not present

## 2021-08-22 DIAGNOSIS — S0990XA Unspecified injury of head, initial encounter: Secondary | ICD-10-CM | POA: Diagnosis not present

## 2021-08-22 DIAGNOSIS — I10 Essential (primary) hypertension: Secondary | ICD-10-CM | POA: Diagnosis not present

## 2021-08-22 DIAGNOSIS — Z743 Need for continuous supervision: Secondary | ICD-10-CM | POA: Diagnosis not present

## 2021-08-22 NOTE — ED Provider Notes (Signed)
[facility] EMERGENCY DEPARTMENT Provider Note    CSN: [id number] Arrival date & time: 08/22/21 1209  History Chief Complaint  Patient presents with   Tiffany Mack is a 81 y.o. female from SNF for evaluation of head injury after a fell. She has dementia and does not remember what happened. Per EMS report she was seen leaning over a table and then fell down. She reports pain to the hematoma on her forehead, but otherwise no reported injuries.    Home Medications Prior to Admission medications   Medication Sig Start Date End Date Taking? Authorizing Provider  acetaminophen (TYLENOL) 325 MG tablet Take 1 tablet (325 mg total) by mouth every 6 (six) hours as needed for mild pain or moderate pain. 09/01/20  Yes Armida Sans, PA-C  aspirin EC 81 MG EC tablet Take 1 tablet (81 mg total) by mouth daily. 08/11/15  Yes Rinaldo Cloud, MD  atorvastatin (LIPITOR) 80 MG tablet Take 1 tablet (80 mg total) by mouth daily at 6 PM. 08/11/15  Yes Rinaldo Cloud, MD  Cholecalciferol (VITAMIN D) 50 MCG (2000 UT) tablet Take 2,000 Units by mouth daily.   Yes [provider]  LORazepam (ATIVAN) 0.5 MG tablet Take 1 tablet (0.5 mg total) by mouth 3 (three) times daily as needed (agitation). 09/04/20  Yes [place], Eric J, DO  memantine (NAMENDA) 10 MG tablet Take 1 tablet every night for 2 weeks, then increase to 1 tablet twice a day Patient taking differently: Take 10 mg by mouth daily. 05/26/19  Yes Van Clines, MD  metoprolol tartrate (LOPRESSOR) 25 MG tablet Take 0.5 tablets (12.5 mg total) by mouth 2 (two) times daily. 08/11/15  Yes Rinaldo Cloud, MD  mirtazapine (REMERON) 30 MG tablet Take 30 mg by mouth at bedtime.  10/08/18  Yes [provider]  nitroGLYCERIN (NITROSTAT) 0.4 MG SL tablet Place 1 tablet (0.4 mg total) under the tongue every 5 (five) minutes as needed for chest pain (CP or SOB). Patient taking differently: Place 0.4 mg under the tongue every 5 (five)  minutes as needed for chest pain (or shortness of breath). 08/11/15  Yes Rinaldo Cloud, MD  ondansetron (ZOFRAN) 8 MG tablet Take 8 mg by mouth every 8 (eight) hours as needed for nausea or vomiting.   Yes [provider]  pantoprazole (PROTONIX) 20 MG tablet Take 20 mg by mouth daily.   Yes [provider]  rivastigmine (EXELON) 13.3 MG/24HR Place 13.3 mg onto the skin daily.  06/22/19  Yes [provider]  senna (SENOKOT) 8.6 MG TABS tablet Take 8.6 mg by mouth daily as needed for mild constipation.   Yes [provider]  traMADol (ULTRAM) 50 MG tablet Take 1 tablet (50 mg total) by mouth every 12 (twelve) hours as needed for severe pain. 09/01/20 09/01/21 Yes Janine Ores K, PA-C  venlafaxine XR (EFFEXOR-XR) 37.5 MG 24 hr capsule Take 37.5 mg by mouth daily with breakfast.   Yes [provider]  enoxaparin (LOVENOX) 40 MG/0.4ML injection Inject 0.4 mLs (40 mg total) into the skin daily. Patient not taking: Reported on 08/22/2021 08/31/20   Armida Sans, PA-C     Allergies    Patient has no known allergies.   Review of Systems   Review of Systems Please see HPI for pertinent positives and negatives  Physical Exam BP (!) 159/63    Pulse 69    Temp 97.8 F (36.6 C) (Oral)    Resp 14  Ht 5\' 7"  (1.702 m)    Wt 66.7 kg    SpO2 100%    BMI 23.02 kg/m   Physical Exam Vitals and nursing note reviewed.  Constitutional:      Appearance: Normal appearance.  HENT:     Head: Normocephalic.     Comments: Large hematoma to L forehead, small superficial skin tear overlying, no deep laceration    Nose: Nose normal.     Mouth/Throat:     Mouth: Mucous membranes are moist.  Eyes:     Extraocular Movements: Extraocular movements intact.     Conjunctiva/sclera: Conjunctivae normal.  Neck:     Comments: C-collar removed Cardiovascular:     Rate and Rhythm: Normal rate.  Pulmonary:     Effort: Pulmonary effort is normal.     Breath sounds: Normal  breath sounds.  Abdominal:     General: Abdomen is flat.     Palpations: Abdomen is soft.     Tenderness: There is no abdominal tenderness.  Musculoskeletal:        General: No swelling. Normal range of motion.     Cervical back: Neck supple. No tenderness.  Skin:    General: Skin is warm and dry.  Neurological:     General: No focal deficit present.     Mental Status: She is alert.  Psychiatric:        Mood and Affect: Mood normal.    ED Results / Procedures / Treatments   EKG None  Procedures Procedures  Medications Ordered in the ED Medications - No data to display  Initial Impression and Plan  Patient with fall, head injury but not on blood thinners. Will order imaging. If neg, anticipate discharge back to SNF.  ED Course   Clinical Course as of 08/22/21 1536  Wed Aug 22, 2021  1356 CT images and results reviewed, no laceration in need of repair. Will return to SNF.  [CS]    Clinical Course User Index [CS] Aug 24, 2021, MD     MDM Rules/Calculators/A&P Medical Decision Making Problems Addressed: Fall, initial encounter: acute illness or injury that poses a threat to life or bodily functions Hematoma of scalp, initial encounter: acute illness or injury that poses a threat to life or bodily functions  Amount and/or Complexity of Data Reviewed Radiology: ordered and independent interpretation performed. Decision-making details documented in ED Course.  Risk Decision regarding hospitalization.    Final Clinical Impression(s) / ED Diagnoses Final diagnoses:  Fall, initial encounter  Hematoma of scalp, initial encounter    Rx / DC Orders ED Discharge Orders     None        08-24-1970, MD 08/22/21 1536

## 2021-08-22 NOTE — ED Notes (Signed)
Discharge instructions reviewed with patient and family. Patient and family verbalized understanding of instructions. Follow-up care and medications were reviewed. Patient wheeled out of ED in wheelchair. VSS upon discharge.  °

## 2021-08-22 NOTE — ED Triage Notes (Signed)
Pt bib ems from [facility] facility c/o unwitnessed fall. Ems was told per facility pt was seen leaning on table then fell and hit the floor. Denies LOC and blood thinners. Left hematoma on forehead that is the size of a golf ball. Baseline Dementia alert to self.    BP 168/64 HR 68 RR 14 RA 96 CBG 121

## 2021-08-30 DIAGNOSIS — R54 Age-related physical debility: Secondary | ICD-10-CM | POA: Diagnosis not present

## 2021-08-30 DIAGNOSIS — Z79899 Other long term (current) drug therapy: Secondary | ICD-10-CM | POA: Diagnosis not present

## 2021-08-30 DIAGNOSIS — Z0000 Encounter for general adult medical examination without abnormal findings: Secondary | ICD-10-CM | POA: Diagnosis not present

## 2021-08-30 DIAGNOSIS — E559 Vitamin D deficiency, unspecified: Secondary | ICD-10-CM | POA: Diagnosis not present

## 2021-08-30 DIAGNOSIS — Z130 Encounter for screening for diseases of the blood and blood-forming organs and certain disorders involving the immune mechanism: Secondary | ICD-10-CM | POA: Diagnosis not present

## 2021-08-30 DIAGNOSIS — I2510 Atherosclerotic heart disease of native coronary artery without angina pectoris: Secondary | ICD-10-CM | POA: Diagnosis not present

## 2021-08-30 DIAGNOSIS — I10 Essential (primary) hypertension: Secondary | ICD-10-CM | POA: Diagnosis not present

## 2021-08-30 DIAGNOSIS — I719 Aortic aneurysm of unspecified site, without rupture: Secondary | ICD-10-CM | POA: Diagnosis not present

## 2021-08-30 DIAGNOSIS — R296 Repeated falls: Secondary | ICD-10-CM | POA: Diagnosis not present

## 2021-08-30 DIAGNOSIS — G4733 Obstructive sleep apnea (adult) (pediatric): Secondary | ICD-10-CM | POA: Diagnosis not present

## 2021-08-30 DIAGNOSIS — E785 Hyperlipidemia, unspecified: Secondary | ICD-10-CM | POA: Diagnosis not present

## 2021-09-09 ENCOUNTER — Emergency Department (HOSPITAL_COMMUNITY)
Admission: EM | Admit: 2021-09-09 | Discharge: 2021-09-09 | Disposition: A | Discharge status: Home / Self Care | Payer: Medicare Other | Attending: Emergency Medicine | Admitting: Emergency Medicine

## 2021-09-09 ENCOUNTER — Emergency Department (HOSPITAL_COMMUNITY): Payer: Medicare Other

## 2021-09-09 ENCOUNTER — Encounter (HOSPITAL_COMMUNITY): Payer: Self-pay | Admitting: Emergency Medicine

## 2021-09-09 ENCOUNTER — Other Ambulatory Visit: Payer: Self-pay

## 2021-09-09 DIAGNOSIS — I2510 Atherosclerotic heart disease of native coronary artery without angina pectoris: Secondary | ICD-10-CM | POA: Diagnosis not present

## 2021-09-09 DIAGNOSIS — F0390 Unspecified dementia without behavioral disturbance: Secondary | ICD-10-CM | POA: Insufficient documentation

## 2021-09-09 DIAGNOSIS — Z79899 Other long term (current) drug therapy: Secondary | ICD-10-CM | POA: Insufficient documentation

## 2021-09-09 DIAGNOSIS — R52 Pain, unspecified: Secondary | ICD-10-CM | POA: Diagnosis not present

## 2021-09-09 DIAGNOSIS — R410 Disorientation, unspecified: Secondary | ICD-10-CM | POA: Diagnosis not present

## 2021-09-09 DIAGNOSIS — R5383 Other fatigue: Secondary | ICD-10-CM | POA: Diagnosis present

## 2021-09-09 DIAGNOSIS — L89159 Pressure ulcer of sacral region, unspecified stage: Secondary | ICD-10-CM

## 2021-09-09 DIAGNOSIS — I10 Essential (primary) hypertension: Secondary | ICD-10-CM | POA: Diagnosis not present

## 2021-09-09 DIAGNOSIS — Z7982 Long term (current) use of aspirin: Secondary | ICD-10-CM | POA: Diagnosis not present

## 2021-09-09 DIAGNOSIS — Z743 Need for continuous supervision: Secondary | ICD-10-CM | POA: Diagnosis not present

## 2021-09-09 LAB — COMPREHENSIVE METABOLIC PANEL
ALT: 15 U/L (ref 0–44)
AST: 19 U/L (ref 15–41)
Albumin: 3.8 g/dL (ref 3.5–5.0)
Alkaline Phosphatase: 122 U/L (ref 38–126)
Anion gap: 12 (ref 5–15)
BUN: 15 mg/dL (ref 8–23)
CO2: 31 mmol/L (ref 22–32)
Calcium: 9.8 mg/dL (ref 8.9–10.3)
Chloride: 99 mmol/L (ref 98–111)
Creatinine, Ser: 0.52 mg/dL (ref 0.44–1.00)
GFR, Estimated: >60 mL/min (ref >60)
Glucose, Bld: 116 mg/dL — ABNORMAL HIGH (ref 70–99)
Potassium: 3.2 mmol/L — ABNORMAL LOW (ref 3.5–5.1)
Sodium: 142 mmol/L (ref 135–145)
Total Bilirubin: 1.1 mg/dL (ref 0.3–1.2)
Total Protein: 7.8 g/dL (ref 6.5–8.1)

## 2021-09-09 LAB — CBC WITH DIFFERENTIAL/PLATELET
Abs Immature Granulocytes: 0.06 10*3/uL (ref 0.00–0.07)
Basophils Absolute: 0.1 10*3/uL (ref 0.0–0.1)
Basophils Relative: 1 %
Eosinophils Absolute: 0 10*3/uL (ref 0.0–0.5)
Eosinophils Relative: 0 %
HCT: 47.3 % — ABNORMAL HIGH (ref 36.0–46.0)
Hemoglobin: 15.4 g/dL — ABNORMAL HIGH (ref 12.0–15.0)
Immature Granulocytes: 1 %
Lymphocytes Relative: 18 %
Lymphs Abs: 2.3 10*3/uL (ref 0.7–4.0)
MCH: 28.7 pg (ref 26.0–34.0)
MCHC: 32.6 g/dL (ref 30.0–36.0)
MCV: 88.2 fL (ref 80.0–100.0)
Monocytes Absolute: 0.7 10*3/uL (ref 0.1–1.0)
Monocytes Relative: 5 %
Neutro Abs: 9.7 10*3/uL — ABNORMAL HIGH (ref 1.7–7.7)
Neutrophils Relative %: 75 %
Platelets: 301 10*3/uL (ref 150–400)
RBC: 5.36 MIL/uL — ABNORMAL HIGH (ref 3.87–5.11)
RDW: 12.4 % (ref 11.5–15.5)
WBC: 12.8 10*3/uL — ABNORMAL HIGH (ref 4.0–10.5)
nRBC: 0 % (ref 0.0–0.2)

## 2021-09-09 LAB — URINALYSIS, ROUTINE W REFLEX MICROSCOPIC
Bacteria, UA: NONE SEEN
Bilirubin Urine: NEGATIVE
Glucose, UA: NEGATIVE mg/dL
Hgb urine dipstick: NEGATIVE
Ketones, ur: 20 mg/dL — AB
Nitrite: NEGATIVE
Protein, ur: NEGATIVE mg/dL
Specific Gravity, Urine: 1.023 (ref 1.005–1.030)
pH: 5 (ref 5.0–8.0)

## 2021-09-09 LAB — MAGNESIUM: Magnesium: 2.2 mg/dL (ref 1.7–2.4)

## 2021-09-09 MED ORDER — POTASSIUM CHLORIDE 10 MEQ/100ML IV SOLN
10.0000 meq | Freq: Once | INTRAVENOUS | Status: AC
  Administered 2021-09-09: 10 meq via INTRAVENOUS
  Filled 2021-09-09: qty 100

## 2021-09-09 MED ORDER — SODIUM CHLORIDE 0.9 % IV BOLUS
1000.0000 mL | Freq: Once | INTRAVENOUS | Status: AC
  Administered 2021-09-09: 1000 mL via INTRAVENOUS

## 2021-09-09 NOTE — ED Provider Notes (Signed)
[facility] EMERGENCY DEPARTMENT Provider Note   CSN: [id number] Arrival date & time: 09/09/21  1418     History  Chief Complaint  Patient presents with   Tiffany Mack is a 81 y.o. female.  The patient presents to the emergency department this afternoon via EMS due to reported increased lethargy by her son.  The son accompanies the patient and provides history.  The son states that the patient has advanced dementia and returned home from a memory care unit approximately 2 weeks ago.  He states that over the past day she has been extremely lethargic, not getting out of bed for as long as 36 hours.  He is concerned about a possible urinary tract infection, dehydration, or a small pneumonia.  The patient is alert to person which is her baseline.  The patient was evaluated 2 weeks ago at the emergency department for a fall and was discharged after evaluation for hematoma.  PMH significant for dementia, hypertension, coronary artery disease, malnutrition of moderate degree, pressure injury of skin  HPI     Home Medications Prior to Admission medications   Medication Sig Start Date End Date Taking? Authorizing Provider  acetaminophen (TYLENOL) 325 MG tablet Take 1 tablet (325 mg total) by mouth every 6 (six) hours as needed for mild pain or moderate pain. 09/01/20   Armida Sans, PA-C  aspirin EC 81 MG EC tablet Take 1 tablet (81 mg total) by mouth daily. 08/11/15   Rinaldo Cloud, MD  atorvastatin (LIPITOR) 80 MG tablet Take 1 tablet (80 mg total) by mouth daily at 6 PM. 08/11/15   Rinaldo Cloud, MD  Cholecalciferol (VITAMIN D) 50 MCG (2000 UT) tablet Take 2,000 Units by mouth daily.    [provider]  enoxaparin (LOVENOX) 40 MG/0.4ML injection Inject 0.4 mLs (40 mg total) into the skin daily. Patient not taking: Reported on 08/22/2021 08/31/20   Janine Ores K, PA-C  LORazepam (ATIVAN) 0.5 MG tablet Take 1 tablet (0.5 mg total) by mouth 3 (three) times daily as  needed (agitation). 09/04/20   [place], Eric J, DO  memantine (NAMENDA) 10 MG tablet Take 1 tablet every night for 2 weeks, then increase to 1 tablet twice a day Patient taking differently: Take 10 mg by mouth daily. 05/26/19   Van Clines, MD  metoprolol tartrate (LOPRESSOR) 25 MG tablet Take 0.5 tablets (12.5 mg total) by mouth 2 (two) times daily. 08/11/15   Rinaldo Cloud, MD  mirtazapine (REMERON) 30 MG tablet Take 30 mg by mouth at bedtime.  10/08/18   [provider]  nitroGLYCERIN (NITROSTAT) 0.4 MG SL tablet Place 1 tablet (0.4 mg total) under the tongue every 5 (five) minutes as needed for chest pain (CP or SOB). Patient taking differently: Place 0.4 mg under the tongue every 5 (five) minutes as needed for chest pain (or shortness of breath). 08/11/15   Rinaldo Cloud, MD  ondansetron (ZOFRAN) 8 MG tablet Take 8 mg by mouth every 8 (eight) hours as needed for nausea or vomiting.    [provider]  pantoprazole (PROTONIX) 20 MG tablet Take 20 mg by mouth daily.    [provider]  rivastigmine (EXELON) 13.3 MG/24HR Place 13.3 mg onto the skin daily.  06/22/19   [provider]  senna (SENOKOT) 8.6 MG TABS tablet Take 8.6 mg by mouth daily as needed for mild constipation.    [provider]  venlafaxine XR (EFFEXOR-XR) 37.5 MG 24 hr capsule Take  37.5 mg by mouth daily with breakfast.    [provider]      Allergies    Patient has no known allergies.    Review of Systems   Review of Systems  Constitutional:  Positive for activity change. Negative for fever.  Neurological:  Positive for weakness.  ROS limited due to patient's baseline mental status. Information provided by son/caretaker  Physical Exam Updated Vital Signs BP (!) 130/52    Pulse 88    Temp 98.2 F (36.8 C) (Axillary)    Resp 17    Ht 5\' 7"  (1.702 m)    Wt 66.7 kg    SpO2 99%    BMI 23.02 kg/m  Physical Exam Constitutional:      General: She is not in acute  distress. HENT:     Head: Normocephalic.  Eyes:     Conjunctiva/sclera: Conjunctivae normal.     Pupils: Pupils are equal, round, and reactive to light.  Cardiovascular:     Rate and Rhythm: Normal rate and regular rhythm.     Pulses: Normal pulses.     Heart sounds: Normal heart sounds.  Pulmonary:     Effort: Pulmonary effort is normal.     Breath sounds: Normal breath sounds.  Abdominal:     Palpations: Abdomen is soft.     Tenderness: There is no abdominal tenderness.  Musculoskeletal:     Cervical back: Normal range of motion.  Skin:    General: Skin is warm and dry.     Findings: Lesion (Sacral decubitus ulcer) present.  Neurological:     Mental Status: She is alert. Mental status is at baseline.    ED Results / Procedures / Treatments   Labs (all labs ordered are listed, but only abnormal results are displayed) Labs Reviewed  COMPREHENSIVE METABOLIC PANEL - Abnormal; Notable for the following components:      Result Value   Potassium 3.2 (*)    Glucose, Bld 116 (*)    All other components within normal limits  CBC WITH DIFFERENTIAL/PLATELET - Abnormal; Notable for the following components:   WBC 12.8 (*)    RBC 5.36 (*)    Hemoglobin 15.4 (*)    HCT 47.3 (*)    Neutro Abs 9.7 (*)    All other components within normal limits  URINALYSIS, ROUTINE W REFLEX MICROSCOPIC - Abnormal; Notable for the following components:   Ketones, ur 20 (*)    Leukocytes,Ua TRACE (*)    All other components within normal limits  MAGNESIUM    EKG EKG Interpretation  Date/Time:  Sunday September 09 2021 14:45:36 EST Ventricular Rate:  97 PR Interval:  152 QRS Duration: 87 QT Interval:  365 QTC Calculation: 464 R Axis:   -6 Text Interpretation: Sinus rhythm Low voltage, precordial leads Confirmed by [id number] (Eber Hong) on 09/09/2021 4:32:56 PM  Radiology DG Chest Port 1 View  Result Date: 09/09/2021 CLINICAL DATA:  Altered mental status. EXAM: PORTABLE CHEST 1 VIEW  COMPARISON:  10/16/2020 FINDINGS: The lungs are clear without focal pneumonia, edema, pneumothorax or pleural effusion. Biapical pleuroparenchymal scarring evident. Interstitial markings are diffusely coarsened with chronic features. The cardiopericardial silhouette is within normal limits for size. Bones are diffusely demineralized. Prior resection medial right clavicle. Telemetry leads overlie the chest. IMPRESSION: Stable.  No acute cardiopulmonary findings. Electronically Signed   By: 10/18/2020 M.D.   On: 09/09/2021 15:41    Procedures .1-3 Lead EKG Interpretation Performed by: 11/07/2021, PA Authorized  by: Darrick GrinderMcCauley, Golda Zavalza B, PA     Interpretation: normal     ECG rate assessment: normal     Rhythm: sinus rhythm     Ectopy: none     Conduction: normal      Medications Ordered in ED Medications  potassium chloride 10 mEq in 100 mL IVPB (10 mEq Intravenous New Bag/Given 09/09/21 1756)  sodium chloride 0.9 % bolus 1,000 mL (0 mLs Intravenous Stopped 09/09/21 1819)    ED Course/ Medical Decision Making/ A&P                           Medical Decision Making Amount and/or Complexity of Data Reviewed Labs: ordered. Radiology: ordered.   This patient presents to the ED for concern of increasing lethargy, this involves an extensive number of treatment options, and is a complaint that carries with it a high risk of complications and morbidity.  The differential diagnosis includes but is not limited to worsening dementia, malnutrition, failure to thrive, electrolyte abnormality, medication adverse effect, and more   Co morbidities that complicate the patient evaluation  Dementia   Additional history obtained:  Additional history obtained from patient's son and EMS External records from outside source obtained and reviewed including hospital chart from 08/22/21   Lab Tests:  I Ordered, and personally interpreted labs.  The pertinent results include:  Potassium  3.2   Imaging Studies ordered:  I ordered imaging studies including chest x-ray  I independently visualized and interpreted imaging which showed no acute findings I agree with the radiologist interpretation   Cardiac Monitoring:  The patient was maintained on a cardiac monitor.  I personally viewed and interpreted the cardiac monitored which showed an underlying rhythm of: Normal sinus rhythm   Medicines ordered and prescription drug management:  I ordered medication including normal saline for possible dehydration and weakness. I also ordered potassium for hypokalemia Reevaluation of the patient after these medicines showed that the patient improved I have reviewed the patients home medicines and have made adjustments as needed   Test Considered:  CT head   Critical Interventions:  None   Consultations Obtained:  None   Reevaluation:  After the interventions noted above, I reevaluated the patient and found that they have :stayed the same   Social Determinants of Health:  Financial hardship. Unable to afford memory care   Dispostion:  After consideration of the diagnostic results and the patients response to treatment, I feel that the patent would benefit from discharge home.  Her lab values are reassuring that there is no acute process at this time.Potassium was slightly low and IV potassium was administered.  The patient is alert and oriented to baseline. The family was supposed to have a home health aide but the service has not contacted them. I've provided a new home health order. I see no reason for new imaging today. I also see no reason to admit the patient to the hospital. This is likely worsening dementia. The patient may discharge home. I recommend follow up with the patient's PCP for further wound care for the decubitus ulcer. Wound bandaged during visit. Return precautions provided.    Final Clinical Impression(s) / ED Diagnoses Final diagnoses:   Dementia, unspecified dementia severity, unspecified dementia type, unspecified whether behavioral, psychotic, or mood disturbance or anxiety (HCC)    Rx / DC Orders ED Discharge Orders          Ordered    Home Health  09/09/21 1834    Face-to-face encounter (required for Medicare/Medicaid patients)       Comments: I Darrick Grinder certify that this patient is under my care and that I, or a nurse practitioner or physician's assistant working with me, had a face-to-face encounter that meets the physician face-to-face encounter requirements with this patient on 09/09/2021. The encounter with the patient was in whole, or in part for the following medical condition(s) which is the primary reason for home health care (List medical condition): Dementia   09/09/21 1834              Darrick Grinder, [place] 09/09/21 1840    Eber Hong, MD 09/09/21 1946

## 2021-09-09 NOTE — ED Notes (Signed)
Patients son at bedside and states that she was seen by provider and was given a shot of antibiotics for UTI.  Son states that her urine still has a strong smell and that the patient slept for 36 hours and hasn't had anything to eat or drink since Friday.

## 2021-09-09 NOTE — Discharge Instructions (Addendum)
You were seen today for lethargy and worsening dementia. Lab work was reassuring that no acute process was going on. I recommend follow up with the primary care provider for further evaluation of dementia and the decubitus ulcer near the patient's sacrum. I did provide a new home health referral. Return to the emergency department if life threatening conditions develop such as chest pain, shortness of breath, or altered level of consciousness.

## 2021-09-09 NOTE — ED Triage Notes (Signed)
Pt to the ED with [facility] after many falls at home.  Pt recently left a SNF due to lack of funds and now lives with her son.  Pt has had many falls and today states she just doesn't feel well and she hurts all over.  Pt's son states she has been refusing to eat and drink and is a failure to thrive situation.

## 2021-09-21 DIAGNOSIS — G4733 Obstructive sleep apnea (adult) (pediatric): Secondary | ICD-10-CM | POA: Diagnosis not present

## 2021-09-21 DIAGNOSIS — I2510 Atherosclerotic heart disease of native coronary artery without angina pectoris: Secondary | ICD-10-CM | POA: Diagnosis not present

## 2021-09-21 DIAGNOSIS — Z79899 Other long term (current) drug therapy: Secondary | ICD-10-CM | POA: Diagnosis not present

## 2021-09-21 DIAGNOSIS — I719 Aortic aneurysm of unspecified site, without rupture: Secondary | ICD-10-CM | POA: Diagnosis not present

## 2021-09-21 DIAGNOSIS — M5030 Other cervical disc degeneration, unspecified cervical region: Secondary | ICD-10-CM | POA: Diagnosis not present

## 2021-09-21 DIAGNOSIS — E7800 Pure hypercholesterolemia, unspecified: Secondary | ICD-10-CM | POA: Diagnosis not present

## 2021-09-21 DIAGNOSIS — I10 Essential (primary) hypertension: Secondary | ICD-10-CM | POA: Diagnosis not present

## 2021-09-21 DIAGNOSIS — Z9181 History of falling: Secondary | ICD-10-CM | POA: Diagnosis not present

## 2021-09-21 DIAGNOSIS — K219 Gastro-esophageal reflux disease without esophagitis: Secondary | ICD-10-CM | POA: Diagnosis not present

## 2021-09-21 DIAGNOSIS — E781 Pure hyperglyceridemia: Secondary | ICD-10-CM | POA: Diagnosis not present

## 2021-09-21 DIAGNOSIS — F0394 Unspecified dementia, unspecified severity, with anxiety: Secondary | ICD-10-CM | POA: Diagnosis not present

## 2021-09-21 DIAGNOSIS — H7290 Unspecified perforation of tympanic membrane, unspecified ear: Secondary | ICD-10-CM | POA: Diagnosis not present

## 2021-09-21 DIAGNOSIS — E440 Moderate protein-calorie malnutrition: Secondary | ICD-10-CM | POA: Diagnosis not present

## 2021-09-21 DIAGNOSIS — M5136 Other intervertebral disc degeneration, lumbar region: Secondary | ICD-10-CM | POA: Diagnosis not present

## 2021-09-21 DIAGNOSIS — F0393 Unspecified dementia, unspecified severity, with mood disturbance: Secondary | ICD-10-CM | POA: Diagnosis not present

## 2021-09-21 DIAGNOSIS — Z87891 Personal history of nicotine dependence: Secondary | ICD-10-CM | POA: Diagnosis not present

## 2021-09-21 DIAGNOSIS — E559 Vitamin D deficiency, unspecified: Secondary | ICD-10-CM | POA: Diagnosis not present

## 2021-09-21 DIAGNOSIS — Z8616 Personal history of COVID-19: Secondary | ICD-10-CM | POA: Diagnosis not present

## 2021-09-21 DIAGNOSIS — J329 Chronic sinusitis, unspecified: Secondary | ICD-10-CM | POA: Diagnosis not present

## 2021-09-21 DIAGNOSIS — D692 Other nonthrombocytopenic purpura: Secondary | ICD-10-CM | POA: Diagnosis not present

## 2021-09-26 DIAGNOSIS — E440 Moderate protein-calorie malnutrition: Secondary | ICD-10-CM | POA: Diagnosis not present

## 2021-09-26 DIAGNOSIS — Z8616 Personal history of COVID-19: Secondary | ICD-10-CM | POA: Diagnosis not present

## 2021-09-26 DIAGNOSIS — G4733 Obstructive sleep apnea (adult) (pediatric): Secondary | ICD-10-CM | POA: Diagnosis not present

## 2021-09-26 DIAGNOSIS — Z79899 Other long term (current) drug therapy: Secondary | ICD-10-CM | POA: Diagnosis not present

## 2021-09-26 DIAGNOSIS — M5136 Other intervertebral disc degeneration, lumbar region: Secondary | ICD-10-CM | POA: Diagnosis not present

## 2021-09-26 DIAGNOSIS — H7290 Unspecified perforation of tympanic membrane, unspecified ear: Secondary | ICD-10-CM | POA: Diagnosis not present

## 2021-09-26 DIAGNOSIS — J329 Chronic sinusitis, unspecified: Secondary | ICD-10-CM | POA: Diagnosis not present

## 2021-09-26 DIAGNOSIS — Z9181 History of falling: Secondary | ICD-10-CM | POA: Diagnosis not present

## 2021-09-26 DIAGNOSIS — F0393 Unspecified dementia, unspecified severity, with mood disturbance: Secondary | ICD-10-CM | POA: Diagnosis not present

## 2021-09-26 DIAGNOSIS — I2510 Atherosclerotic heart disease of native coronary artery without angina pectoris: Secondary | ICD-10-CM | POA: Diagnosis not present

## 2021-09-26 DIAGNOSIS — Z87891 Personal history of nicotine dependence: Secondary | ICD-10-CM | POA: Diagnosis not present

## 2021-09-26 DIAGNOSIS — E7800 Pure hypercholesterolemia, unspecified: Secondary | ICD-10-CM | POA: Diagnosis not present

## 2021-09-26 DIAGNOSIS — E559 Vitamin D deficiency, unspecified: Secondary | ICD-10-CM | POA: Diagnosis not present

## 2021-09-26 DIAGNOSIS — I719 Aortic aneurysm of unspecified site, without rupture: Secondary | ICD-10-CM | POA: Diagnosis not present

## 2021-09-26 DIAGNOSIS — I10 Essential (primary) hypertension: Secondary | ICD-10-CM | POA: Diagnosis not present

## 2021-09-26 DIAGNOSIS — F0394 Unspecified dementia, unspecified severity, with anxiety: Secondary | ICD-10-CM | POA: Diagnosis not present

## 2021-09-26 DIAGNOSIS — K219 Gastro-esophageal reflux disease without esophagitis: Secondary | ICD-10-CM | POA: Diagnosis not present

## 2021-09-26 DIAGNOSIS — D692 Other nonthrombocytopenic purpura: Secondary | ICD-10-CM | POA: Diagnosis not present

## 2021-09-26 DIAGNOSIS — M5030 Other cervical disc degeneration, unspecified cervical region: Secondary | ICD-10-CM | POA: Diagnosis not present

## 2021-09-28 DIAGNOSIS — H7290 Unspecified perforation of tympanic membrane, unspecified ear: Secondary | ICD-10-CM | POA: Diagnosis not present

## 2021-09-28 DIAGNOSIS — G4733 Obstructive sleep apnea (adult) (pediatric): Secondary | ICD-10-CM | POA: Diagnosis not present

## 2021-09-28 DIAGNOSIS — I719 Aortic aneurysm of unspecified site, without rupture: Secondary | ICD-10-CM | POA: Diagnosis not present

## 2021-09-28 DIAGNOSIS — M5136 Other intervertebral disc degeneration, lumbar region: Secondary | ICD-10-CM | POA: Diagnosis not present

## 2021-09-28 DIAGNOSIS — M5030 Other cervical disc degeneration, unspecified cervical region: Secondary | ICD-10-CM | POA: Diagnosis not present

## 2021-09-28 DIAGNOSIS — I10 Essential (primary) hypertension: Secondary | ICD-10-CM | POA: Diagnosis not present

## 2021-09-28 DIAGNOSIS — Z9181 History of falling: Secondary | ICD-10-CM | POA: Diagnosis not present

## 2021-09-28 DIAGNOSIS — Z8616 Personal history of COVID-19: Secondary | ICD-10-CM | POA: Diagnosis not present

## 2021-09-28 DIAGNOSIS — E7800 Pure hypercholesterolemia, unspecified: Secondary | ICD-10-CM | POA: Diagnosis not present

## 2021-09-28 DIAGNOSIS — J329 Chronic sinusitis, unspecified: Secondary | ICD-10-CM | POA: Diagnosis not present

## 2021-09-28 DIAGNOSIS — Z87891 Personal history of nicotine dependence: Secondary | ICD-10-CM | POA: Diagnosis not present

## 2021-09-28 DIAGNOSIS — I2510 Atherosclerotic heart disease of native coronary artery without angina pectoris: Secondary | ICD-10-CM | POA: Diagnosis not present

## 2021-09-28 DIAGNOSIS — K219 Gastro-esophageal reflux disease without esophagitis: Secondary | ICD-10-CM | POA: Diagnosis not present

## 2021-09-28 DIAGNOSIS — E440 Moderate protein-calorie malnutrition: Secondary | ICD-10-CM | POA: Diagnosis not present

## 2021-09-28 DIAGNOSIS — E559 Vitamin D deficiency, unspecified: Secondary | ICD-10-CM | POA: Diagnosis not present

## 2021-09-28 DIAGNOSIS — F0393 Unspecified dementia, unspecified severity, with mood disturbance: Secondary | ICD-10-CM | POA: Diagnosis not present

## 2021-09-28 DIAGNOSIS — D692 Other nonthrombocytopenic purpura: Secondary | ICD-10-CM | POA: Diagnosis not present

## 2021-09-28 DIAGNOSIS — F0394 Unspecified dementia, unspecified severity, with anxiety: Secondary | ICD-10-CM | POA: Diagnosis not present

## 2021-09-28 DIAGNOSIS — Z79899 Other long term (current) drug therapy: Secondary | ICD-10-CM | POA: Diagnosis not present

## 2021-10-27 DEATH — deceased

## 2022-03-03 IMAGING — CT CT HEAD W/O CM
2 of 3 series · 13 of 47 positions shown, 16 images · non-contrast
Comparison: 10/16/2020

CLINICAL DATA: Head trauma

EXAM:
CT HEAD WITHOUT CONTRAST
CT CERVICAL SPINE WITHOUT CONTRAST
TECHNIQUE: Multidetector CT imaging of the head and cervical spine was
performed following the standard protocol without intravenous
contrast. Multiplanar CT image reconstructions of the cervical spine
were also generated.

[Series 3: head wo · axial · 0.47mm/px · z∈[+1544,+1674]mm · 10 of 32 slices shown, 13 images]
[im 3/32  brain]
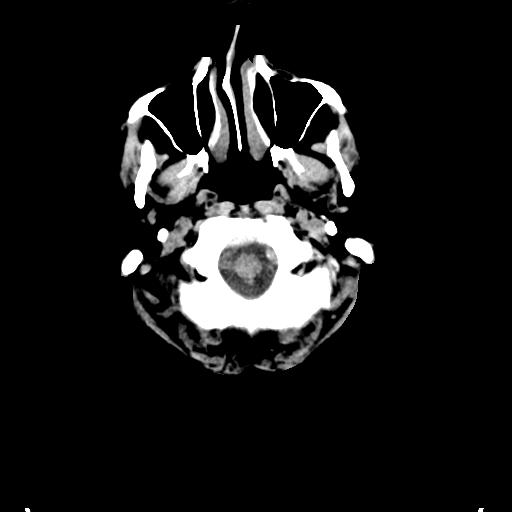
[im 3/32  bone]
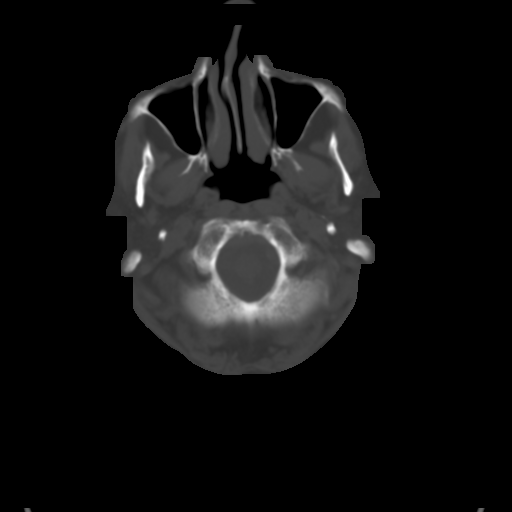
[im 6/32  brain]
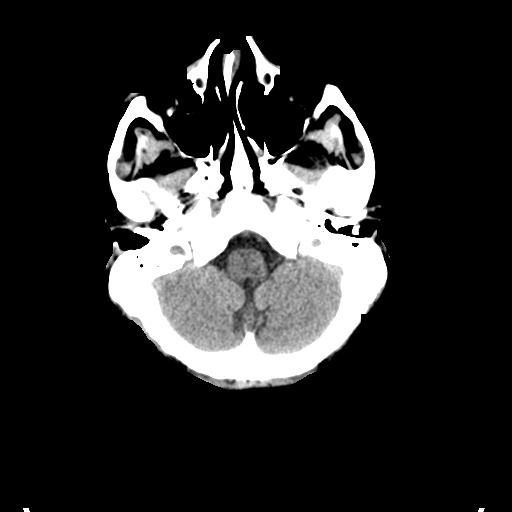
[im 9/32  brain]
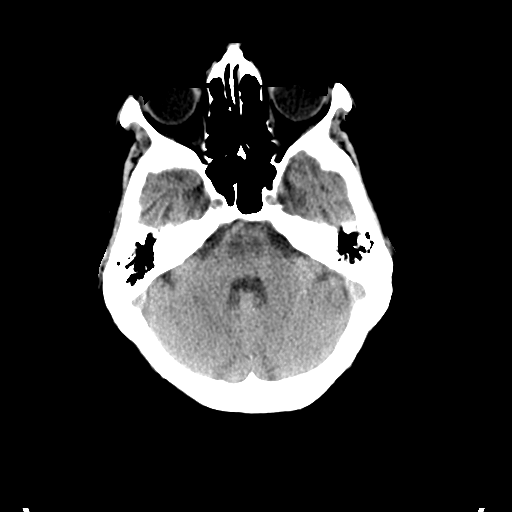
[im 11/32  brain]
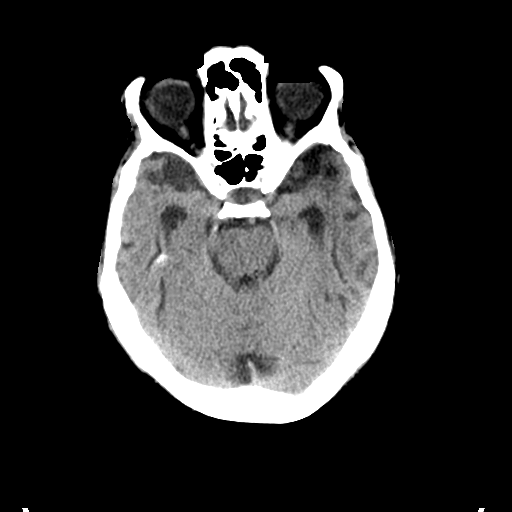
[im 14/32  brain]
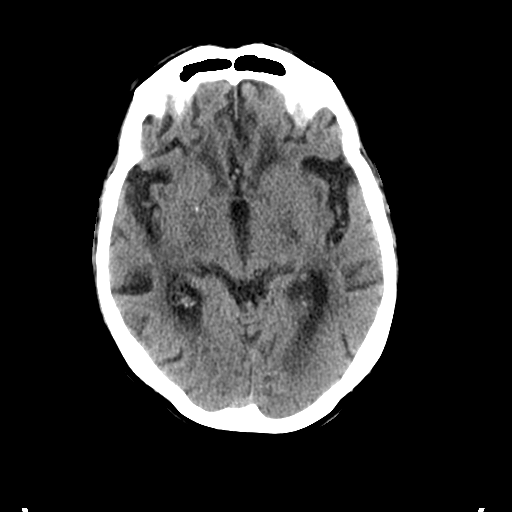
[im 14/32  bone]
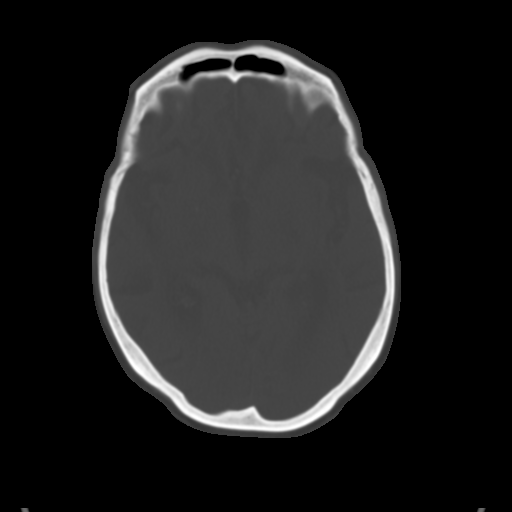
[im 18/32  brain]
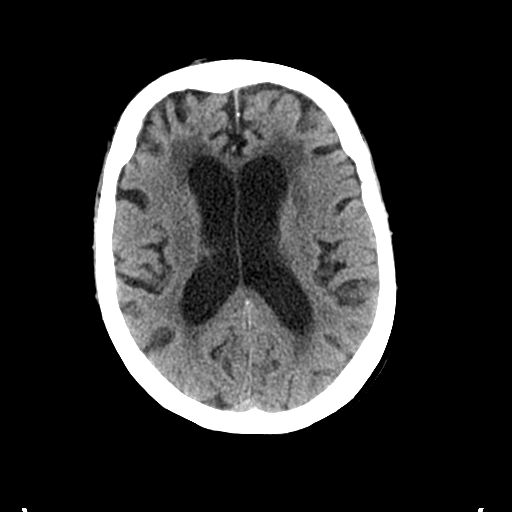
[im 21/32  brain]
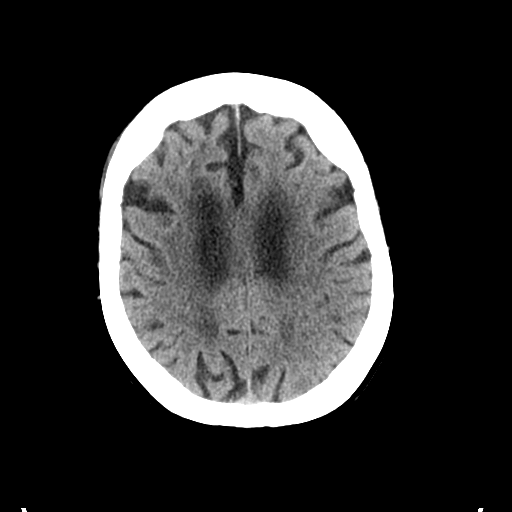
[im 24/32  brain]
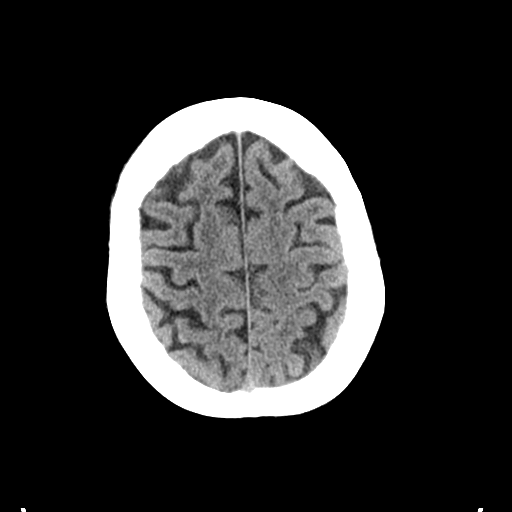
[im 26/32  brain]
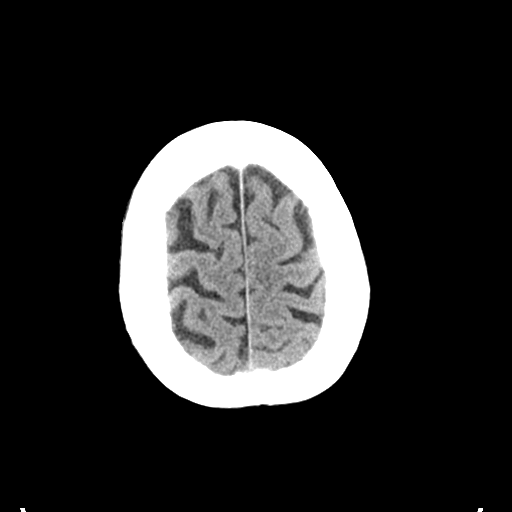
[im 26/32  bone]
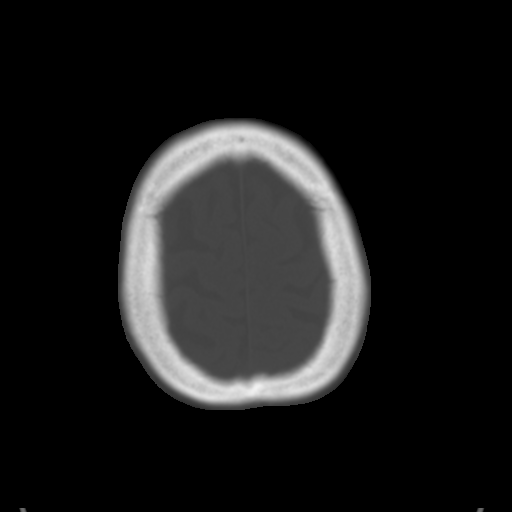
[im 29/32  brain]
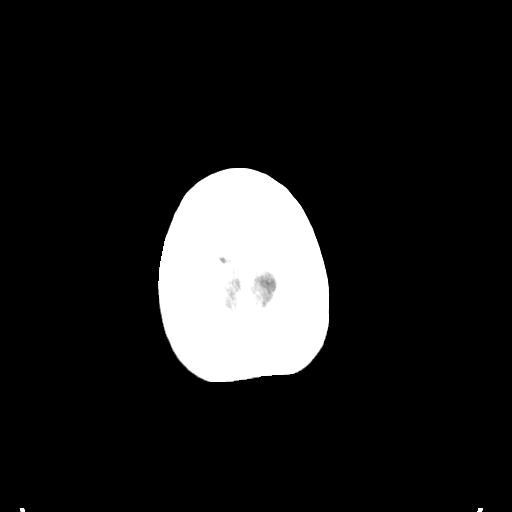

[Series 6: coronal soft tissue · coronal · 0.31mm/px · 3 of 68 slices shown]
[im 23/68  brain]
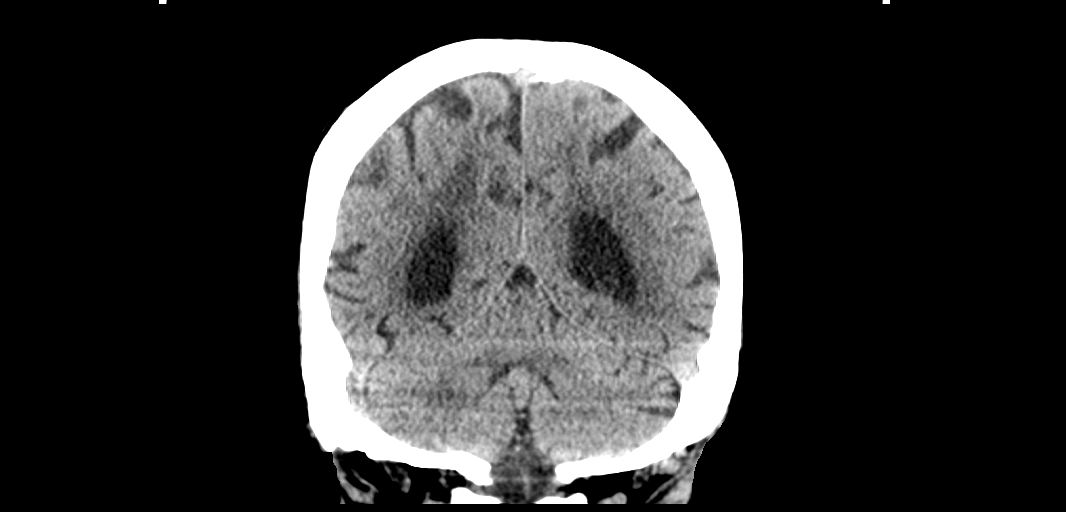
[im 30/68  brain]
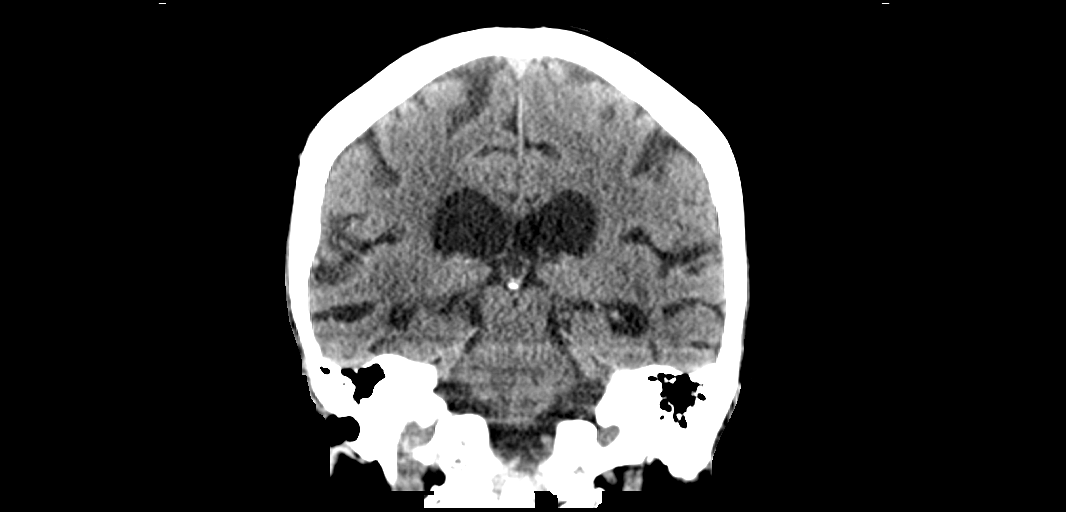
[im 38/68  brain]
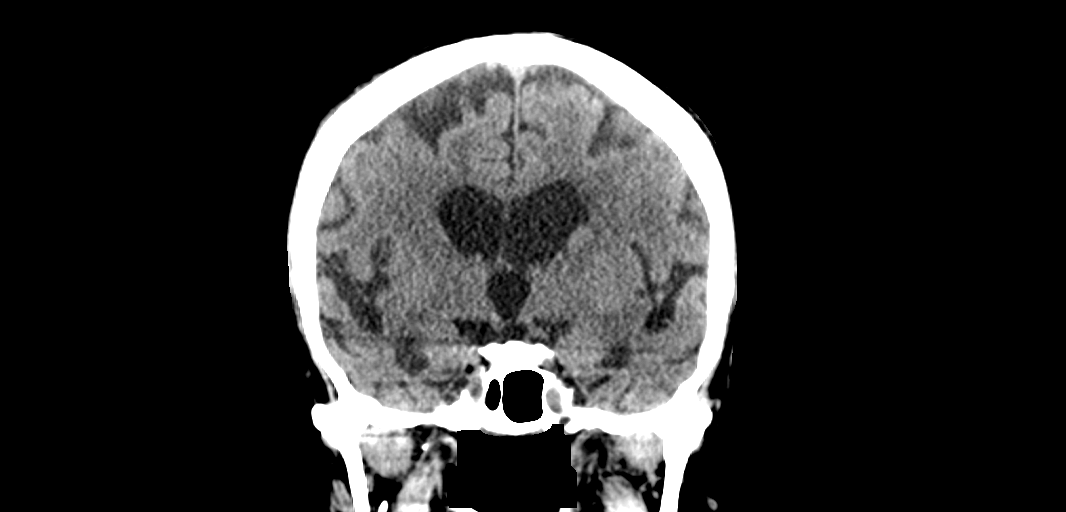

[13 of 47 positions shown; findings below may reference images not displayed]

FINDINGS: CT HEAD FINDINGS

Brain: No evidence of acute infarction, hemorrhage, hydrocephalus,
extra-axial collection or mass lesion/mass effect. Periventricular
and deep white matter hypodensity.

Vascular: No hyperdense vessel or unexpected calcification.

Skull: Normal. Negative for fracture or focal lesion.

Sinuses/Orbits: No acute finding.

Other: None.

CT CERVICAL SPINE FINDINGS

Alignment: Normal.

Skull base and vertebrae: No acute fracture. No primary bone lesion
or focal pathologic process.

Soft tissues and spinal canal: No prevertebral fluid or swelling. No
visible canal hematoma.

Disc levels: Generally mild disc space height loss throughout the
cervical spine with large anterior bridging osteophytes.

Upper chest: Negative.

Other: None.
IMPRESSION: 1. No acute intracranial pathology. Small-vessel white matter
disease.
2. No fracture or static subluxation of the cervical spine.
3. Generally mild disc space height loss throughout the cervical
spine with large anterior bridging osteophytes.

## 2022-03-03 IMAGING — CR DG KNEE COMPLETE 4+V*R*
4 series · 4 of 4 positions shown · non-contrast
Comparison: None

CLINICAL DATA: Fall, dementia, RIGHT hip pain, RIGHT knee pain,
tenderness LEFT shoulder, initial encounter

EXAM:
RIGHT KNEE - COMPLETE 4+ VIEW

[t knee obl right (1 of 2)]
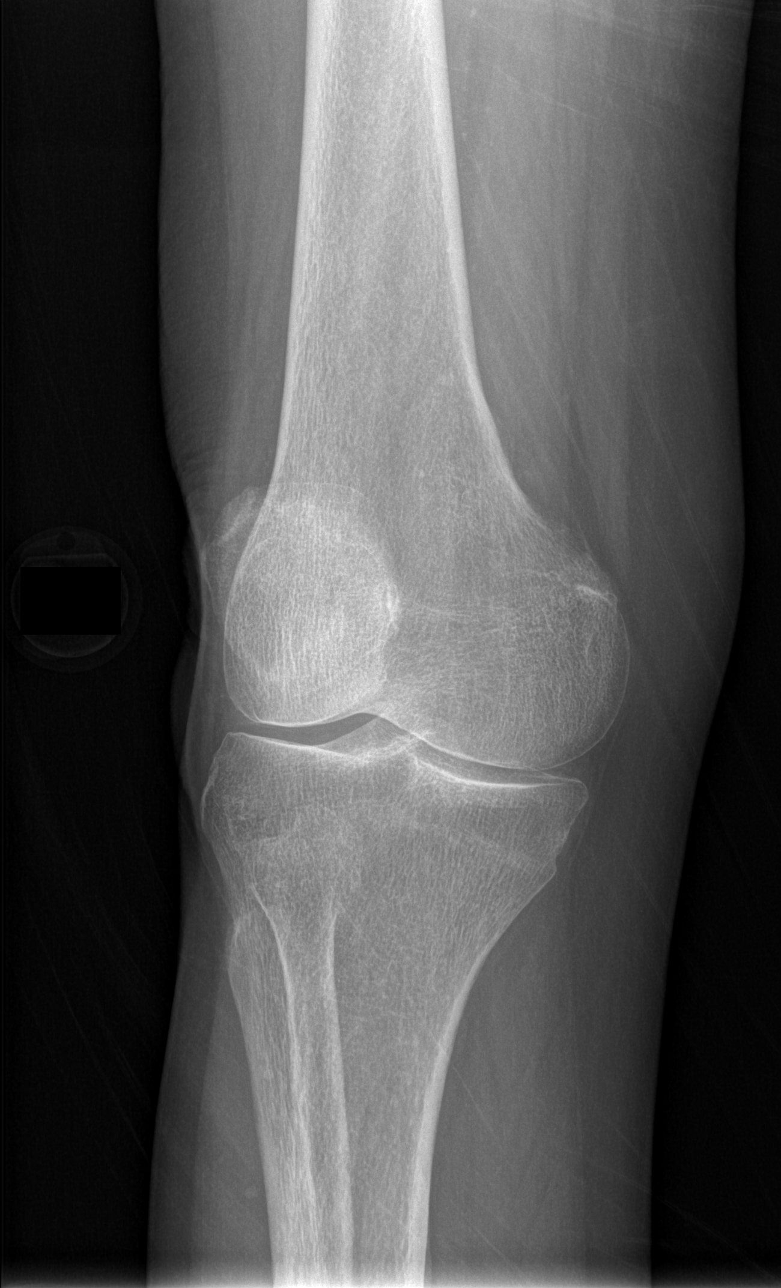

[t knee ap right]
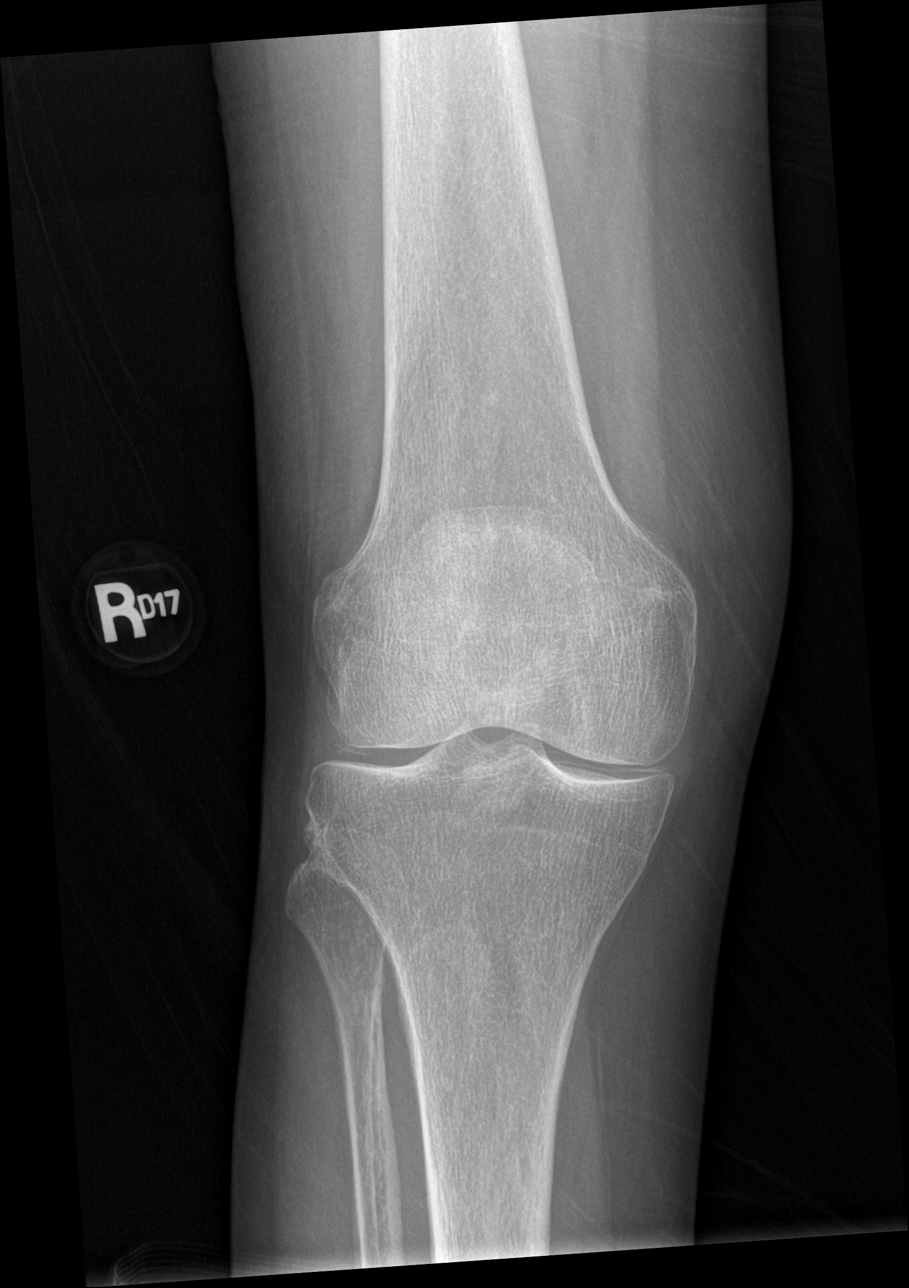

[t knee obl right (2 of 2)]
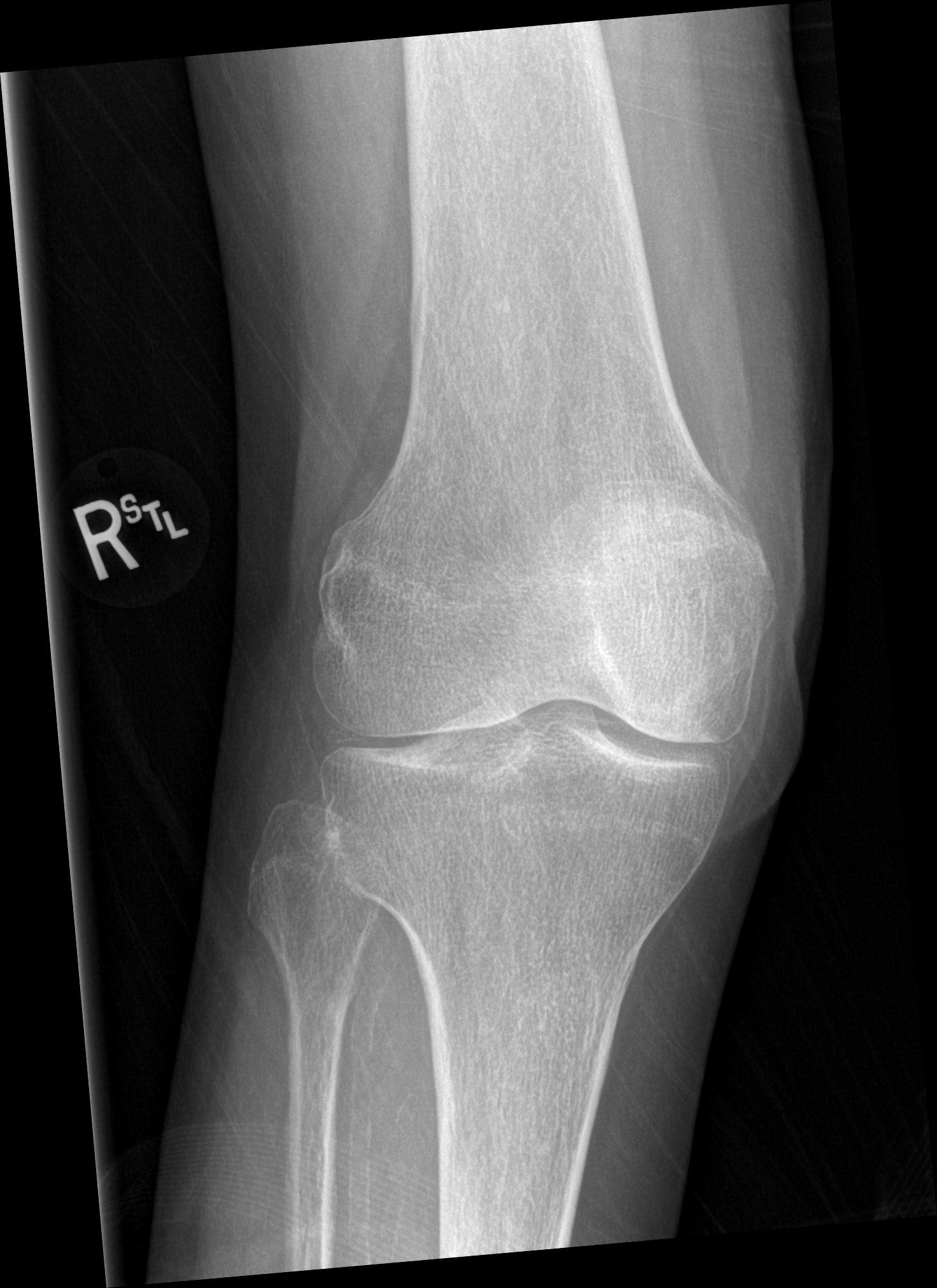

[x knee lat right]
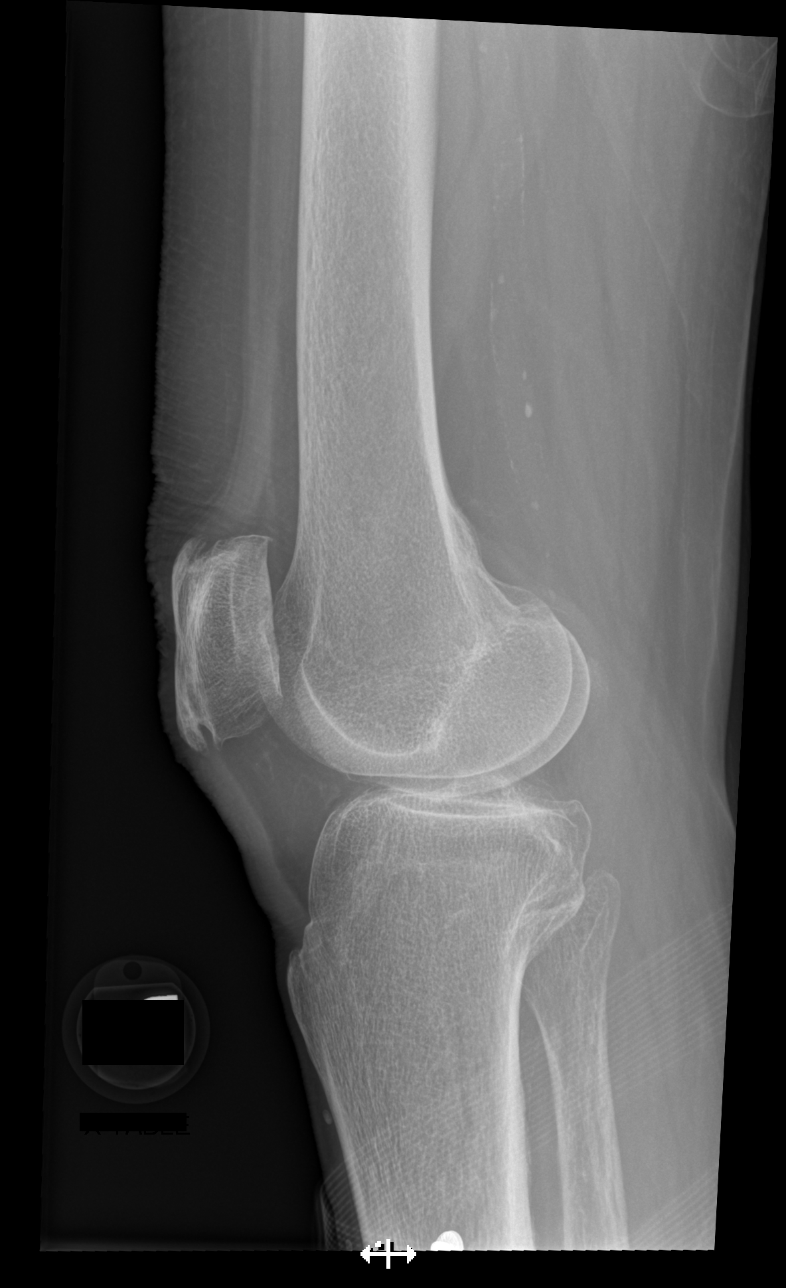

[4 of 4 positions shown; findings below may reference images not displayed]

FINDINGS: Osseous demineralization.

Joint space narrowing and minimal chondrocalcinosis.

No acute fracture, dislocation, or bone destruction.

No joint effusion.

Patellar spurs at quadriceps and patellar tendon insertions.

Atherosclerotic calcifications of the superficial femoral artery.
IMPRESSION: Mild degenerative changes and question CPPD RIGHT knee.

No acute osseous abnormalities.
